# Patient Record
Sex: Male | Born: 1960 | Race: White | Hispanic: No | Marital: Married | Smoking: Never smoker
Health system: Southern US, Community
[De-identification: ages and names within clinical notes are randomized; demographics above are authoritative.]

## PROBLEM LIST (undated history)

## (undated) DIAGNOSIS — I10 Essential (primary) hypertension: Secondary | ICD-10-CM

## (undated) DIAGNOSIS — G4730 Sleep apnea, unspecified: Secondary | ICD-10-CM

## (undated) DIAGNOSIS — Z8619 Personal history of other infectious and parasitic diseases: Secondary | ICD-10-CM

## (undated) DIAGNOSIS — E7800 Pure hypercholesterolemia, unspecified: Secondary | ICD-10-CM

## (undated) HISTORY — DX: Sleep apnea, unspecified: G47.30

## (undated) HISTORY — DX: Essential (primary) hypertension: I10

## (undated) HISTORY — PX: INNER EAR SURGERY: SHX679

## (undated) HISTORY — DX: Pure hypercholesterolemia, unspecified: E78.00

## (undated) HISTORY — PX: TONSILLECTOMY: SUR1361

## (undated) HISTORY — DX: Personal history of other infectious and parasitic diseases: Z86.19

---

## 1980-06-23 HISTORY — PX: KNEE SURGERY: SHX244

## 2010-08-30 ENCOUNTER — Ambulatory Visit: Payer: Self-pay | Admitting: Gastroenterology

## 2010-12-12 ENCOUNTER — Encounter: Payer: Self-pay | Admitting: Orthopedic Surgery

## 2010-12-22 ENCOUNTER — Encounter: Payer: Self-pay | Admitting: Orthopedic Surgery

## 2012-03-23 ENCOUNTER — Ambulatory Visit (INDEPENDENT_AMBULATORY_CARE_PROVIDER_SITE_OTHER): Payer: Managed Care, Other (non HMO) | Admitting: Internal Medicine

## 2012-03-23 ENCOUNTER — Encounter: Payer: Self-pay | Admitting: Internal Medicine

## 2012-03-23 VITALS — BP 138/72 | HR 84 | Temp 98.5°F | Ht 73.0 in | Wt 224.0 lb

## 2012-03-23 DIAGNOSIS — R0602 Shortness of breath: Secondary | ICD-10-CM

## 2012-03-23 DIAGNOSIS — I10 Essential (primary) hypertension: Secondary | ICD-10-CM | POA: Insufficient documentation

## 2012-03-23 DIAGNOSIS — R079 Chest pain, unspecified: Secondary | ICD-10-CM

## 2012-03-23 LAB — CBC WITH DIFFERENTIAL/PLATELET
Basophils Relative: 0.3 % (ref 0.0–3.0)
Eosinophils Relative: 0.7 % (ref 0.0–5.0)
HCT: 43.8 % (ref 39.0–52.0)
Hemoglobin: 14.7 g/dL (ref 13.0–17.0)
Lymphs Abs: 1.4 10*3/uL (ref 0.7–4.0)
MCV: 92.7 fl (ref 78.0–100.0)
Monocytes Absolute: 0.5 10*3/uL (ref 0.1–1.0)
Monocytes Relative: 9.2 % (ref 3.0–12.0)
RBC: 4.72 Mil/uL (ref 4.22–5.81)
WBC: 5.2 10*3/uL (ref 4.5–10.5)

## 2012-03-23 LAB — HEPATIC FUNCTION PANEL
ALT: 40 U/L (ref 0–53)
Albumin: 4.1 g/dL (ref 3.5–5.2)
Alkaline Phosphatase: 44 U/L (ref 39–117)
Bilirubin, Direct: 0.1 mg/dL (ref 0.0–0.3)
Total Protein: 6.9 g/dL (ref 6.0–8.3)

## 2012-03-23 NOTE — Patient Instructions (Addendum)
It was good seeing you today.  I am sorry that you have not been feeling well.  I am going to do some blood tests and an ultrasound of your heart - to evaluated the pain.  We will contact you regarding these results as soon as they are available.  Call, return or be evaluated if symptoms worsen or do not resolve.

## 2012-03-23 NOTE — Assessment & Plan Note (Addendum)
Hypertension.  Blood pressure controlled.  Same meds.  Check met b.

## 2012-03-23 NOTE — Progress Notes (Signed)
  Subjective:    Patient ID: Rodney Benjamin, male    DOB: July 30, 1960, 51 y.o.   MRN: [id number]  HPI 51 year old male with past history of hypertension who comes in today with concerns regarding left chest and side pain/tightness.  Symptoms started approximately 3-4 weeks ago.  Intermittent.  Not brought on by increased activity or exertion.  He reports previously noticing some tightness with taking a deep breath.  Some sob, which has improved some.  No known injury or trauma.  Movement does not aggravate, but he does report it bothers him more when he is sitting.  Better with standing and lying flat.  No rash.  Took Ibuprofen last week and did notice some improvement.  Symptoms have not completely resolved.    Past Medical History  Diagnosis Date  . Chicken pox   . Hypertension     Review of Systems Patient denies any headache, lightheadedness or dizziness. Chest tightness as outlined.  No palpitations.  No cough or congestion. Some minimal sob - noticed more previously and with taking a deep breath. No pain with deep breathing.   No nausea or vomiting. No acid reflux.   No abdominal pain or cramping.  No bowel change, such as diarrhea, constipation, BRBPR or melana.  No urine change. No back pain.     Objective:   Physical Exam 51 year old male in no acute distress.   HEENT:  Nares - clear.  OP- without lesions or erythema.  NECK:  Supple, nontender.  No audible carotid bruit.   HEART:  Appears to be regular.  No murmur audible.   LUNGS:  Without crackles or wheezing audible.  Respirations even and unlabored. Good breath sounds bilaterally.   CHEST:  No pain to palpation.  (no reproducible pain).    RADIAL PULSE:  Equal bilaterally.  ABDOMEN:  Soft, nontender.  No audible abdominal bruit. BACK:  Nontender.    EXTREMITIES:  No increased edema to be present.   SKIN:  No rash.                      Assessment & Plan:  1.  Chest tightness/pain.  EKG today revealed SR with no acute  ischemic change noted.  Will hold on stress test -  given symptoms do not appear to worsen with increased activity or exertion.  Will check ECHO (given the tightness and sob) - to confirm no structural abnormality.  Hold on CXR - lungs clear and no cough or congestion.  Minimize Ibuprofen.  Use Tylenol ES as instructed.  Follow.  If any worsening change in symptoms or problems, he is to be evaluated immediately.  Will check cbc and liver panel.

## 2012-03-24 ENCOUNTER — Encounter: Payer: Self-pay | Admitting: Internal Medicine

## 2012-03-24 ENCOUNTER — Other Ambulatory Visit (HOSPITAL_COMMUNITY): Payer: Self-pay | Admitting: Internal Medicine

## 2012-03-24 ENCOUNTER — Ambulatory Visit (HOSPITAL_COMMUNITY): Payer: Managed Care, Other (non HMO) | Attending: Cardiovascular Disease | Admitting: Radiology

## 2012-03-24 DIAGNOSIS — R079 Chest pain, unspecified: Secondary | ICD-10-CM

## 2012-03-24 DIAGNOSIS — R072 Precordial pain: Secondary | ICD-10-CM

## 2012-03-24 LAB — BASIC METABOLIC PANEL WITH GFR
BUN: 20 mg/dL (ref 6–23)
CO2: 27 mEq/L (ref 19–32)
Calcium: 9.1 mg/dL (ref 8.4–10.5)
GFR, Est African American: 75 mL/min
Glucose, Bld: 94 mg/dL (ref 70–99)
Sodium: 138 mEq/L (ref 135–145)

## 2012-03-24 NOTE — Progress Notes (Signed)
Echocardiogram performed.  

## 2012-04-26 ENCOUNTER — Ambulatory Visit
Admission: RE | Admit: 2012-04-26 | Discharge: 2012-04-26 | Disposition: A | Discharge status: Home / Self Care | Payer: Managed Care, Other (non HMO) | Source: Ambulatory Visit | Attending: Internal Medicine | Admitting: Internal Medicine

## 2012-04-26 ENCOUNTER — Ambulatory Visit (INDEPENDENT_AMBULATORY_CARE_PROVIDER_SITE_OTHER): Payer: Managed Care, Other (non HMO) | Admitting: Internal Medicine

## 2012-04-26 ENCOUNTER — Ambulatory Visit (INDEPENDENT_AMBULATORY_CARE_PROVIDER_SITE_OTHER)
Admission: RE | Admit: 2012-04-26 | Discharge: 2012-04-26 | Disposition: A | Discharge status: Home / Self Care | Payer: Managed Care, Other (non HMO) | Source: Ambulatory Visit | Attending: Internal Medicine | Admitting: Internal Medicine

## 2012-04-26 ENCOUNTER — Encounter: Payer: Self-pay | Admitting: Internal Medicine

## 2012-04-26 VITALS — BP 122/80 | HR 72 | Temp 97.8°F | Ht 74.0 in | Wt 223.0 lb

## 2012-04-26 DIAGNOSIS — R079 Chest pain, unspecified: Secondary | ICD-10-CM

## 2012-04-26 DIAGNOSIS — R0781 Pleurodynia: Secondary | ICD-10-CM

## 2012-04-26 DIAGNOSIS — I10 Essential (primary) hypertension: Secondary | ICD-10-CM

## 2012-04-26 LAB — BASIC METABOLIC PANEL
BUN: 22 mg/dL (ref 6–23)
CO2: 28 mEq/L (ref 19–32)
Calcium: 8.9 mg/dL (ref 8.4–10.5)
Creatinine, Ser: 1.2 mg/dL (ref 0.4–1.5)

## 2012-04-26 MED ORDER — ETODOLAC 400 MG PO TABS: 400.0000 mg | ORAL_TABLET | Freq: Two times a day (BID) | ORAL | Status: DC | PRN

## 2012-04-26 NOTE — Assessment & Plan Note (Addendum)
Blood pressure under good control.  Check met b.  Stay hydrated.  Monitor blood pressures while on the antiinglammatory.

## 2012-04-26 NOTE — Patient Instructions (Addendum)
It was good seeing you again today.  I am going to go ahead and put you on Lodine (an antiinflammatory) - to take twice a day as needed.  Take with food.  Monitor your blood pressure.  Let me know if you have persistent pain.  We will notify you of your xray results once they are available.

## 2012-04-26 NOTE — Progress Notes (Signed)
  Subjective:    Patient ID: Rodney Benjamin, male    DOB: [birth year], 51 y.o.   MRN: [id number]  HPI 50 year old male with past history of hypertension who comes in today for a scheduled follow up.  He states he has been doing well.  Still with some left anterior chest pain.  Localized to a spot under the left breast/lower anterior rib. He also notices a spot left lateral rib.  No rash.  No pain with deep breathing.  No cough or congestion.  Pain has been persistent for weeks now.  He did put up some shingles over the weekend and felt this may have aggravated the area.  No tenderness with palpation.  Position changes can alleviate the pain somewhat.  Tylenol may help some.    Past Medical History  Diagnosis Date  . Chicken pox   . Hypertension     Review of Systems Patient denies any headache, lightheadedness or dizziness.  No sinus or allergy symptoms.  No other chest pain, tightness or palpitations.  No increased shortness of breath, cough or congestion.  No nausea or vomiting.  No abdominal pain or cramping.  No bowel change, such as diarrhea, constipation, BRBPR or melana.  No urine change.  Overall, otherwise he feels he is doing well.      Objective:   Physical Exam Filed Vitals:   04/26/12 0807  BP: 122/80  Pulse: 72  Temp: 97.8 F (54.58 C)   51 year old male in no acute distress.   HEENT:  Nares - clear.  OP- without lesions or erythema.  NECK:  Supple, nontender.  No audible bruit.   HEART:  Appears to be regular. LUNGS:  Without crackles or wheezing audible.  Respirations even and unlabored.  No pain with deep inspiration.   CHEST WALL:  No pain to palpation.  No rash.  RADIAL PULSE:  Equal bilaterally.  ABDOMEN:  Soft, nontender.  No audible abdominal bruit.   EXTREMITIES:  No increased edema to be present.                     Assessment & Plan:  CHEST PAIN.  Recent ECHO revealed normal LV function with no regional wall motion abnormality.  Pain persistent.  Appears  to be somewhat more c/w msk origin.  Will treat with Lodine 400mg  bid prn. Instructed to take with food and instructed to monitor blood pressure while on the medication.  Will check CXR and rib xray.  Call with update in the next two weeks.    HEALTH MAINTENANCE.  Will schedule a physical next visit.  Due in 12/13.  Discuss further health maintenance issues with him at that time.

## 2012-05-05 ENCOUNTER — Encounter: Payer: Self-pay | Admitting: *Deleted

## 2012-05-24 ENCOUNTER — Telehealth: Payer: Self-pay | Admitting: Internal Medicine

## 2012-05-24 NOTE — Telephone Encounter (Signed)
I don't mind doing labs ahead of time, but with insurance changes - they may not pay.  Does he still want to schedule.  If so, I can order.  Thanks.

## 2012-05-24 NOTE — Telephone Encounter (Signed)
Pt wanted to see if he could be seen sooner than 3/7 for his cpx  He would like to do this before end of year

## 2012-05-24 NOTE — Telephone Encounter (Signed)
Pt made appointment for cpx 08/27/12 and wanted to get labs prior to this appointment is that ok

## 2012-05-25 NOTE — Telephone Encounter (Signed)
Left message for pt to call office. Please advise him of his appointment 12/12 @ 9

## 2012-05-25 NOTE — Telephone Encounter (Signed)
See if he can come in 06/03/12 at 9:00 thanks

## 2012-05-26 NOTE — Telephone Encounter (Signed)
Sent my chart note letting pt know when appointment is

## 2012-06-03 ENCOUNTER — Encounter: Payer: Self-pay | Admitting: Internal Medicine

## 2012-06-03 ENCOUNTER — Ambulatory Visit (INDEPENDENT_AMBULATORY_CARE_PROVIDER_SITE_OTHER): Payer: Managed Care, Other (non HMO) | Admitting: Internal Medicine

## 2012-06-03 VITALS — BP 130/88 | HR 78 | Temp 98.4°F | Ht 74.0 in | Wt 223.5 lb

## 2012-06-03 DIAGNOSIS — Z23 Encounter for immunization: Secondary | ICD-10-CM

## 2012-06-03 DIAGNOSIS — Z125 Encounter for screening for malignant neoplasm of prostate: Secondary | ICD-10-CM

## 2012-06-03 DIAGNOSIS — E7800 Pure hypercholesterolemia, unspecified: Secondary | ICD-10-CM

## 2012-06-03 DIAGNOSIS — I10 Essential (primary) hypertension: Secondary | ICD-10-CM

## 2012-06-03 LAB — LIPID PANEL
Cholesterol: 181 mg/dL (ref 0–200)
LDL Cholesterol: 116 mg/dL — ABNORMAL HIGH (ref 0–99)

## 2012-06-03 LAB — BASIC METABOLIC PANEL
BUN: 17 mg/dL (ref 6–23)
Chloride: 99 mEq/L (ref 96–112)
GFR: 65.68 mL/min (ref >60.00)
Potassium: 4.1 mEq/L (ref 3.5–5.1)
Sodium: 134 mEq/L — ABNORMAL LOW (ref 135–145)

## 2012-06-03 NOTE — Patient Instructions (Addendum)
It was nice seeing you today.  Let me know if you need anything.   

## 2012-06-04 ENCOUNTER — Encounter: Payer: Self-pay | Admitting: Internal Medicine

## 2012-06-04 ENCOUNTER — Telehealth: Payer: Self-pay | Admitting: Internal Medicine

## 2012-06-04 DIAGNOSIS — E871 Hypo-osmolality and hyponatremia: Secondary | ICD-10-CM

## 2012-06-04 NOTE — Telephone Encounter (Signed)
Pt was notified of lab results via my chart.  He needs a follow up sodium check within the next two weeks.  Will you please call and schedule him for a lab appt time.  This is not a fasting lab.  I will place the order for the lab.  Thanks.

## 2012-06-05 ENCOUNTER — Telehealth: Payer: Self-pay | Admitting: Internal Medicine

## 2012-06-05 MED ORDER — LISINOPRIL 10 MG PO TABS: 10.0000 mg | ORAL_TABLET | Freq: Every day | ORAL | Status: DC

## 2012-06-05 NOTE — Telephone Encounter (Signed)
rx sent to [facility] for lisinopril 10mg  #90 with 3 refills

## 2012-06-06 ENCOUNTER — Encounter: Payer: Self-pay | Admitting: Internal Medicine

## 2012-06-06 DIAGNOSIS — E7800 Pure hypercholesterolemia, unspecified: Secondary | ICD-10-CM | POA: Insufficient documentation

## 2012-06-06 NOTE — Assessment & Plan Note (Signed)
Low cholesterol diet and exercise.  Check lipid panel.   

## 2012-06-06 NOTE — Assessment & Plan Note (Signed)
Blood pressure a little elevated today.  Have him spot check his pressure and send in.  Same medication.  Check metabolic panel.

## 2012-06-06 NOTE — Progress Notes (Signed)
  Subjective:    Patient ID: Rodney Benjamin, male    DOB: 1960-11-27, 51 y.o.   MRN: [id number]  HPI 51 year old male with past history of hypertension and hypercholesterolemia who comes in today to follow up on these issues as well as for a complete physical exam.  He states he is doing well.  Still having the lower ant chest/rib pain.  Has improved.  No chest pain or tightness with increased activity or exertion.  No sob.  Eating and drinking well.  No nausea or vomiting.  No bowel change.   Past Medical History  Diagnosis Date  . Hypertension   . Hypercholesterolemia   . History of chicken pox     Review of Systems Patient denies any headache, lightheadedness or dizziness.  No significant sinus or allergy symptoms.  No chest pain, tightness or palpitations.  No increased shortness of breath, cough or congestion.  No nausea or vomiting.  No abdominal pain or cramping.  No bowel change, such as diarrhea, constipation, BRBPR or melana.  No urine change.        Objective:   Physical Exam Filed Vitals:   06/03/12 0901  BP: 130/88  Pulse: 78  Temp: 98.4 F (79.53 C)   51 year old male in no acute distress.  HEENT:  Nares - clear.  Oropharynx - without lesions. NECK:  Supple.  Nontender.  No audible carotid bruit.  HEART:  Appears to be regular.   LUNGS:  No crackles or wheezing audible.  Respirations even and unlabored.  Good breath sounds bilaterally.   CHEST WALL.  No reproducible pain to palpation.  RADIAL PULSE:  Equal bilaterally.  ABDOMEN:  Soft.  Nontender.  Bowel sounds present and normal.  No audible abdominal bruit.  GU:  Normal descended testicles.  No palpable testicular nodules.   RECTAL:  Could not appreciate any palpable prostate nodules.  Heme negative.   EXTREMITIES:  No increased edema present.  DP pulses palpable and equal bilaterally.          Assessment & Plan:  CHEST/LOWER ANTERIOR RIB PAIN.  Better.  Not reproducible on exam.  Previous cxr negative. Did  not appear to change with Lodine.  Will follow.  If persistent pain, obtain CT.   ROSACEA.  Followed by dermatology in [place].    HEALTH MAINTENANCE.  Physical today.  Check PSA today. Colonoscopy 08/30/10 recommended follow up colonoscopy 08/29/20.

## 2012-06-15 ENCOUNTER — Telehealth: Payer: Self-pay | Admitting: Internal Medicine

## 2012-06-15 NOTE — Telephone Encounter (Signed)
I don't know if you already have a message on this pt.  I had notified him of his labs on 06/05/12 (via My Chart).  He needed a follow up sodium check.  I placed the order for lab, but he needs an appt time to come in.  Will you please schedule him an appt for lab only (not fasting) - within the next week.  Thanks.

## 2012-06-17 ENCOUNTER — Encounter: Payer: Self-pay | Admitting: Internal Medicine

## 2012-06-18 NOTE — Telephone Encounter (Signed)
scheduled

## 2012-06-21 ENCOUNTER — Other Ambulatory Visit (INDEPENDENT_AMBULATORY_CARE_PROVIDER_SITE_OTHER): Payer: Managed Care, Other (non HMO)

## 2012-06-21 DIAGNOSIS — E871 Hypo-osmolality and hyponatremia: Secondary | ICD-10-CM

## 2012-06-22 ENCOUNTER — Encounter: Payer: Self-pay | Admitting: Internal Medicine

## 2012-06-22 DIAGNOSIS — R079 Chest pain, unspecified: Secondary | ICD-10-CM

## 2012-06-23 NOTE — Telephone Encounter (Signed)
Ct chest ordered for persistent chest pain.

## 2012-06-25 ENCOUNTER — Ambulatory Visit: Payer: Self-pay | Admitting: Internal Medicine

## 2012-07-05 ENCOUNTER — Encounter: Payer: Self-pay | Admitting: Internal Medicine

## 2012-08-27 ENCOUNTER — Encounter: Payer: Managed Care, Other (non HMO) | Admitting: Internal Medicine

## 2012-10-07 ENCOUNTER — Encounter: Payer: Self-pay | Admitting: Internal Medicine

## 2012-10-07 ENCOUNTER — Ambulatory Visit (INDEPENDENT_AMBULATORY_CARE_PROVIDER_SITE_OTHER): Payer: Managed Care, Other (non HMO) | Admitting: Internal Medicine

## 2012-10-07 VITALS — BP 120/70 | HR 80 | Temp 98.3°F | Ht 74.0 in | Wt 223.8 lb

## 2012-10-07 DIAGNOSIS — I10 Essential (primary) hypertension: Secondary | ICD-10-CM

## 2012-10-07 DIAGNOSIS — E7800 Pure hypercholesterolemia, unspecified: Secondary | ICD-10-CM

## 2012-10-07 LAB — BASIC METABOLIC PANEL
BUN: 17 mg/dL (ref 6–23)
Calcium: 9.1 mg/dL (ref 8.4–10.5)
Creatinine, Ser: 1.2 mg/dL (ref 0.4–1.5)
GFR: 66.21 mL/min (ref >60.00)
Glucose, Bld: 115 mg/dL — ABNORMAL HIGH (ref 70–99)

## 2012-10-08 ENCOUNTER — Encounter: Payer: Self-pay | Admitting: Internal Medicine

## 2012-10-09 ENCOUNTER — Encounter: Payer: Self-pay | Admitting: Internal Medicine

## 2012-10-09 NOTE — Progress Notes (Signed)
  Subjective:    Patient ID: Rodney Benjamin, male    DOB: 1961/03/25, 52 y.o.   MRN: [id number]  HPI 52 year old male with past history of hypertension and hypercholesterolemia who comes in today for a scheduled follow up.  He states he is doing well.  Still having the lower ant chest/rib pain, but it is better.  Now only occurs with certain positions.  Riding in a car aggravates at times.  The more active he is - better.  No chest pain or tightness with increased activity or exertion.  No sob.  Eating and drinking well.  No nausea or vomiting.  No bowel change.  Went to [facility] three weeks ago.  States he was told he had pneumonia.  Received a steroid injection and abx.  Better now.  Had cxr.     Past Medical History  Diagnosis Date  . Hypertension   . Hypercholesterolemia   . History of chicken pox     Current Outpatient Prescriptions on File Prior to Visit  Medication Sig Dispense Refill  . doxycycline (DORYX) 100 MG EC tablet Take 100 mg by mouth daily.       Marland Kitchen lisinopril (PRINIVIL,ZESTRIL) 10 MG tablet Take 1 tablet (10 mg total) by mouth daily.  90 tablet  3   No current facility-administered medications on file prior to visit.    Review of Systems Patient denies any headache, lightheadedness or dizziness.  No significant sinus or allergy symptoms.  No chest pain, tightness or palpitations.  No increased shortness of breath, cough or congestion.  No nausea or vomiting.  No abdominal pain or cramping.  No bowel change, such as diarrhea, constipation, BRBPR or melana.  No urine change.  Rib/chest pain improved.  See above.      Objective:   Physical Exam  Filed Vitals:   10/07/12 0903  BP: 120/70  Pulse: 80  Temp: 98.3 F (36.8 C)   Blood pressure recheck:  128/78, pulse 38  52 year old male in no acute distress.  HEENT:  Nares - clear.  Oropharynx - without lesions. NECK:  Supple.  Nontender.  No audible carotid bruit.  HEART:  Appears to be regular.   LUNGS:  No  crackles or wheezing audible.  Respirations even and unlabored.  Good breath sounds bilaterally.   CHEST WALL.  No reproducible pain to palpation.  RADIAL PULSE:  Equal bilaterally.  ABDOMEN:  Soft.  Nontender.  Bowel sounds present and normal.  No audible abdominal bruit. EXTREMITIES:  No increased edema present.  DP pulses palpable and equal bilaterally.          Assessment & Plan:  CHEST/LOWER ANTERIOR RIB PAIN.  Better.  Not reproducible on exam.  Previous cxr negative.  More positional now when it does occur.  CT chest revealed an indeterminate 1.83mm nodule within the right middle lobe along the periphery, an ill defined increased density in the base of the lingula, no further focal or acute abnormalities.  Specifically, the region of interest within the chest wall is unremarkable.  Dr Thelma Barge reviewed.  Felt no further w/up warranted at this time.  Recommended a follow up chest CT in one year from date of previous.   ROSACEA.  Followed by dermatology in [place].    HEALTH MAINTENANCE.  Physical 06/03/12.  PSA 06/03/12 .61. Colonoscopy 08/30/10 recommended follow up colonoscopy 08/29/20.

## 2012-10-09 NOTE — Assessment & Plan Note (Signed)
Low cholesterol diet and exercise.  Follow lipid panel.   

## 2012-10-09 NOTE — Assessment & Plan Note (Signed)
Blood pressure doing well.  Same medication.  Check metabolic panel.

## 2012-10-12 ENCOUNTER — Telehealth: Payer: Self-pay

## 2012-10-12 NOTE — Telephone Encounter (Signed)
Left message for patient to call the office back to hear about his results... He just needs to know that his recent lab results. Your sodium is normal. Your other labs are ok

## 2012-12-25 IMAGING — CR DG RIBS 2V*L*
4 series · 4 of 4 positions shown · non-contrast
Comparison: 04/26/2012

CLINICAL DATA: Chest pain, lower anterior rib pain

LEFT RIBS - 2 VIEW

[view not recorded (1 of 4)]
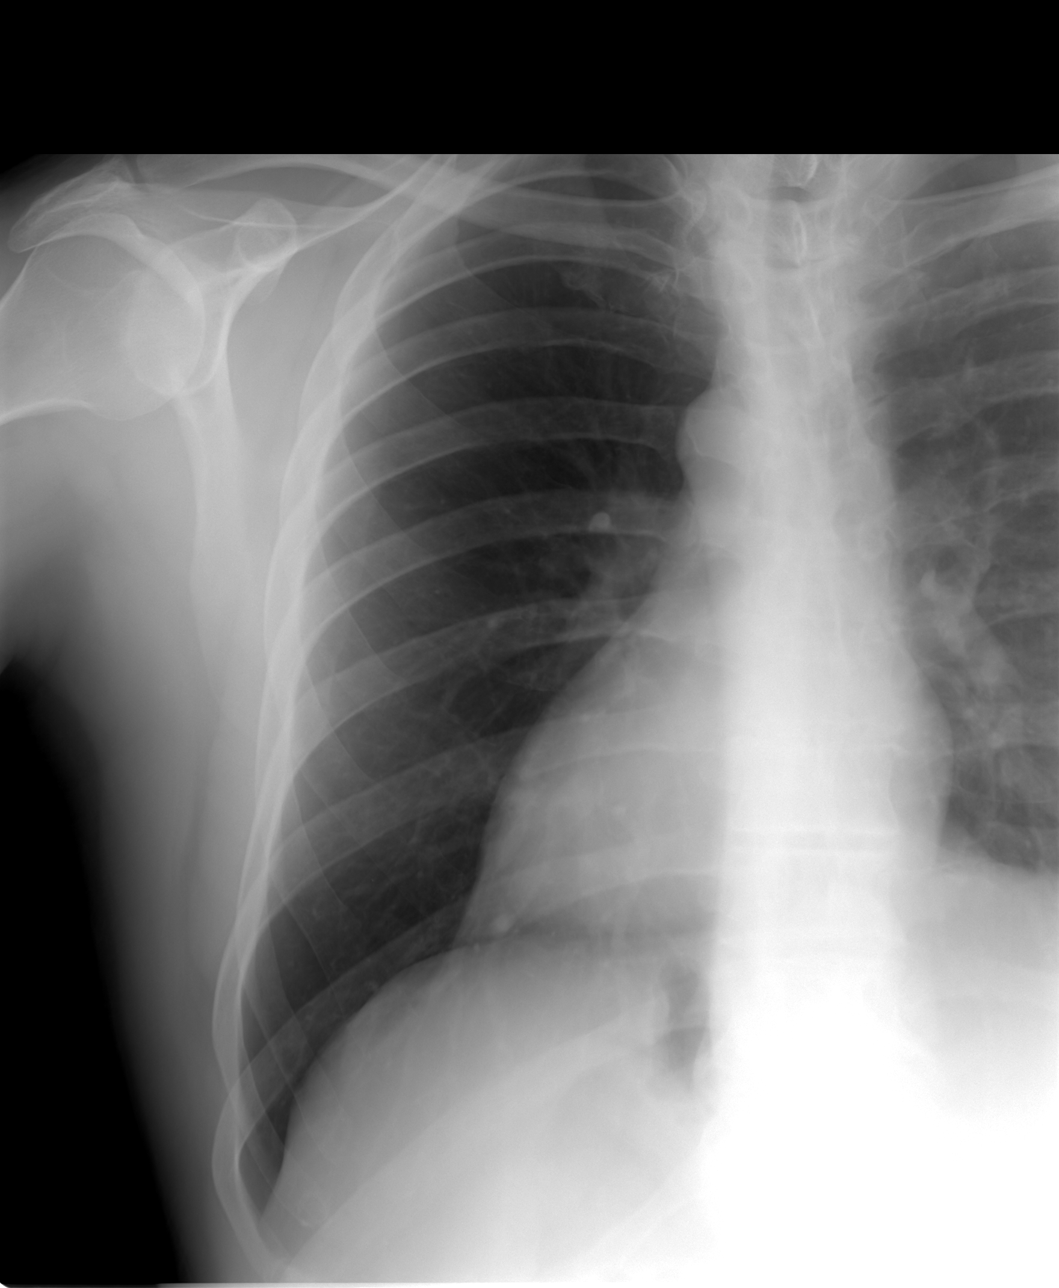

[view not recorded (2 of 4)]
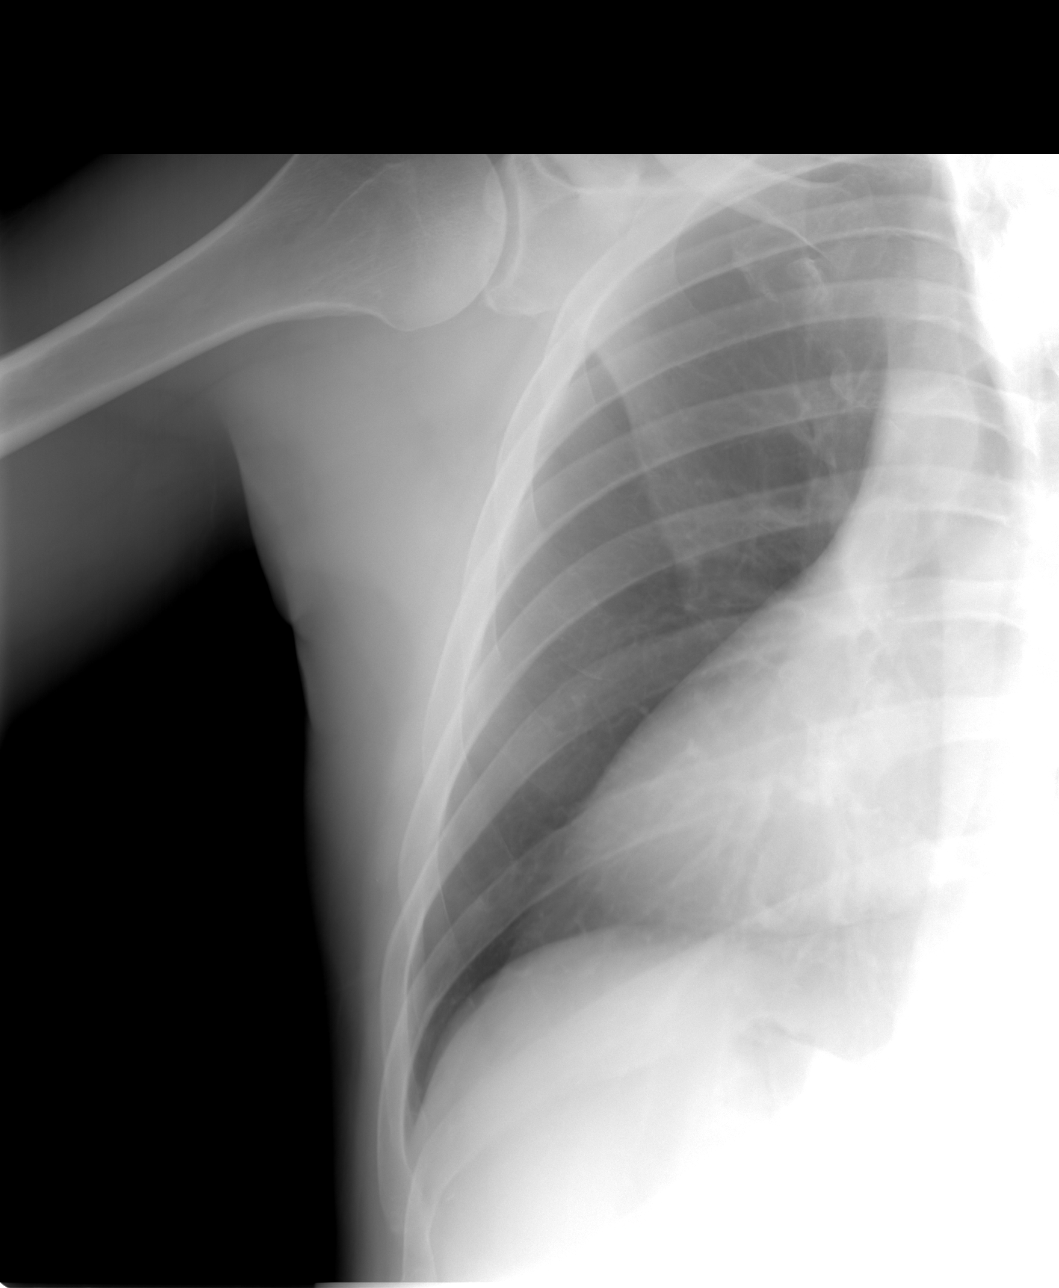

[view not recorded (3 of 4)]
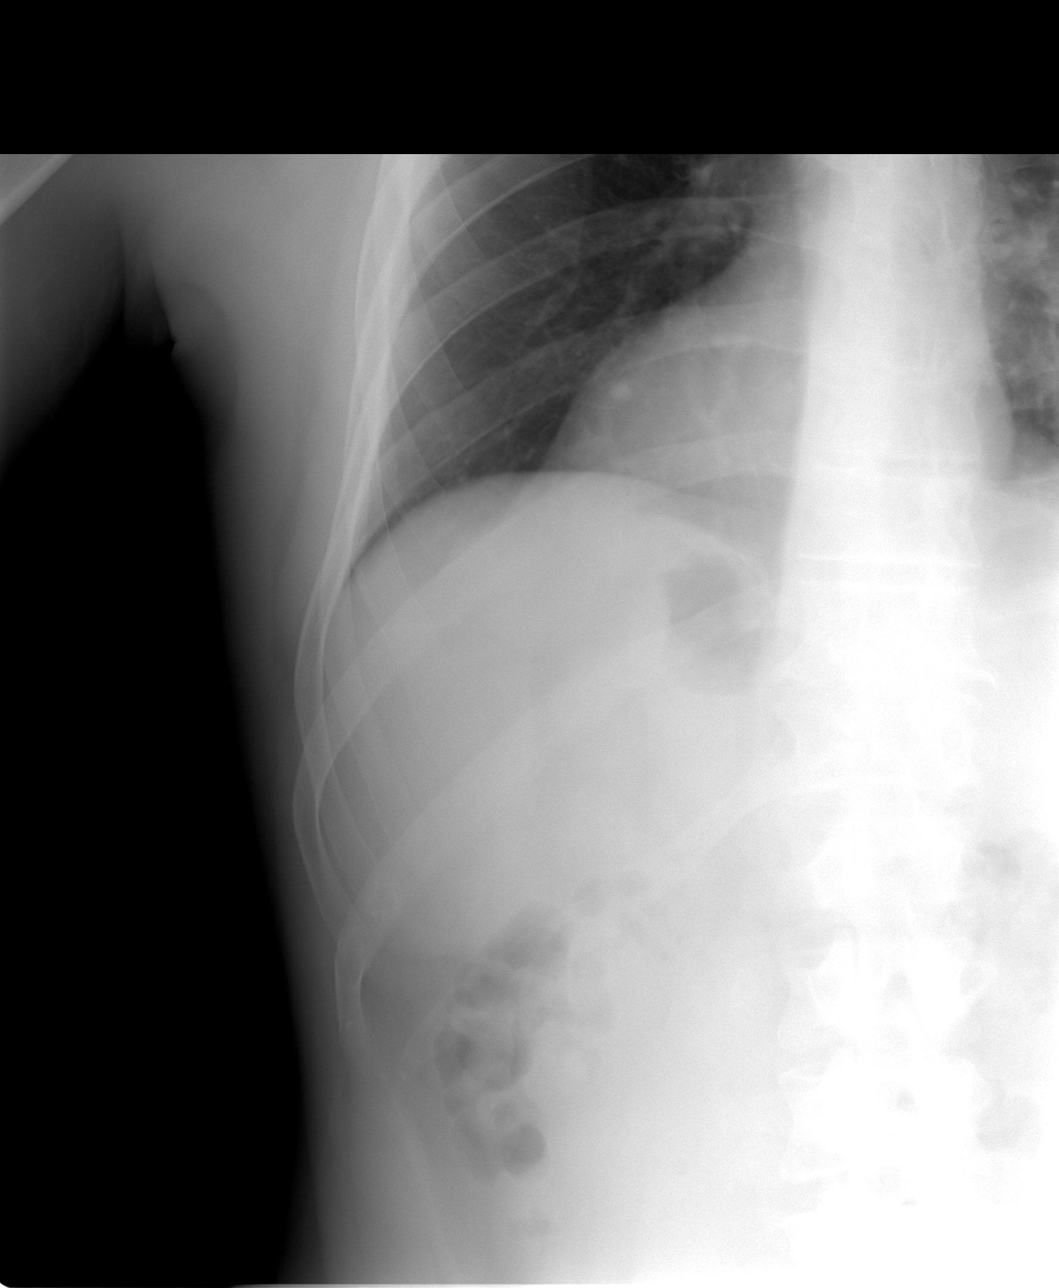

[view not recorded (4 of 4)]
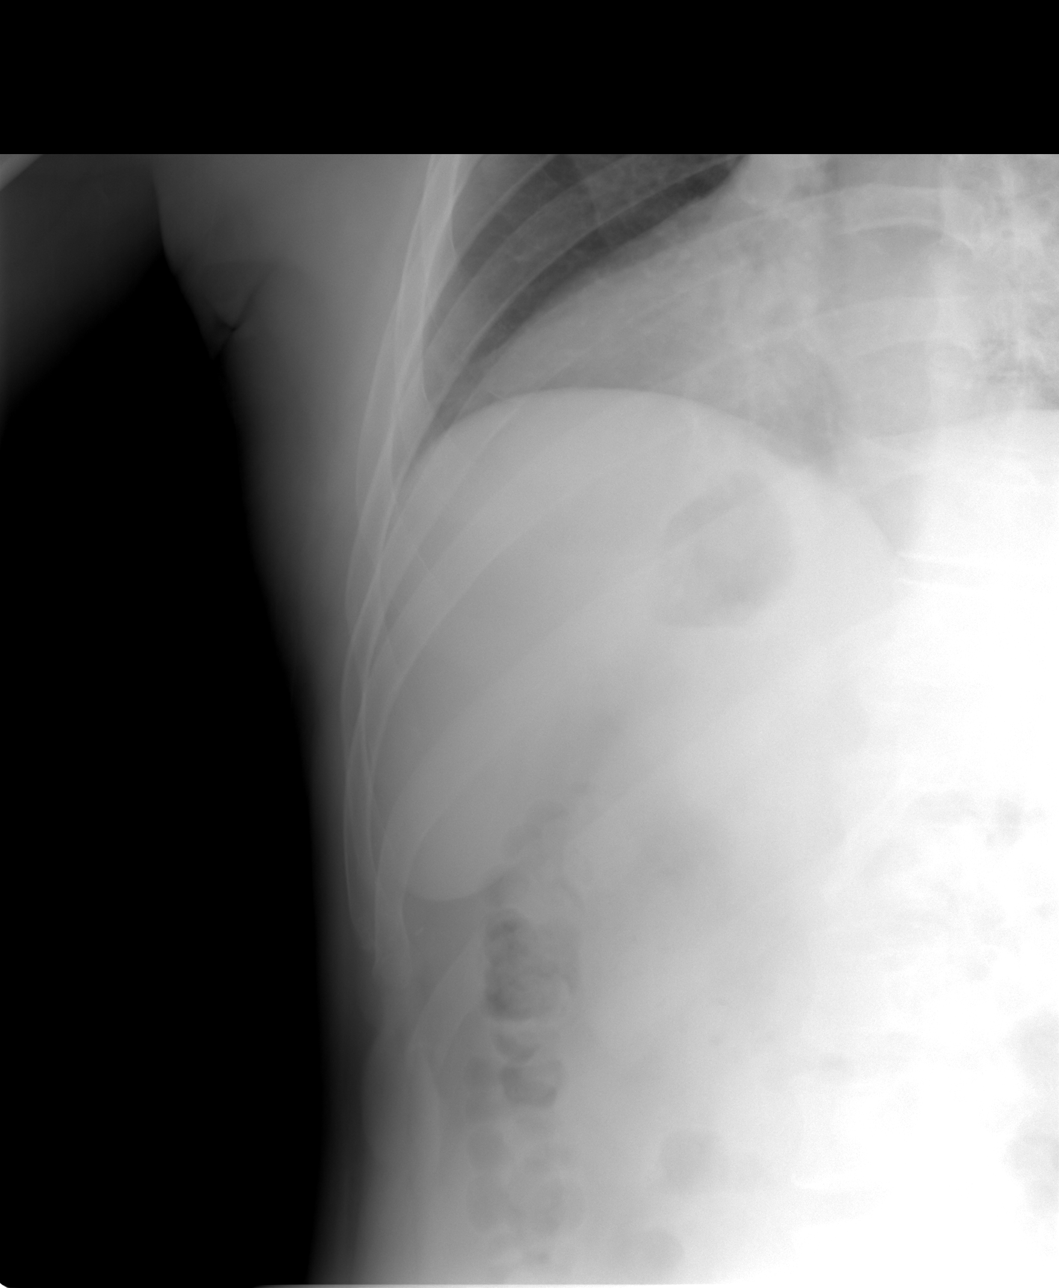

[4 of 4 positions shown; findings below may reference images not displayed]

FINDINGS: Four views left ribs submitted.  Cardiomediastinal
silhouette is unremarkable.  No left rib fracture is identified.
No diagnostic pneumothorax.
IMPRESSION: No left rib fracture is identified.  No diagnostic pneumothorax.

## 2013-02-08 ENCOUNTER — Encounter: Payer: Self-pay | Admitting: Internal Medicine

## 2013-02-08 ENCOUNTER — Telehealth: Payer: Self-pay | Admitting: *Deleted

## 2013-02-08 ENCOUNTER — Ambulatory Visit (INDEPENDENT_AMBULATORY_CARE_PROVIDER_SITE_OTHER): Payer: Managed Care, Other (non HMO) | Admitting: Internal Medicine

## 2013-02-08 VITALS — BP 120/80 | HR 72 | Temp 98.0°F | Ht 74.0 in | Wt 221.2 lb

## 2013-02-08 DIAGNOSIS — I10 Essential (primary) hypertension: Secondary | ICD-10-CM

## 2013-02-08 DIAGNOSIS — E7800 Pure hypercholesterolemia, unspecified: Secondary | ICD-10-CM

## 2013-02-08 DIAGNOSIS — R1012 Left upper quadrant pain: Secondary | ICD-10-CM

## 2013-02-08 DIAGNOSIS — R109 Unspecified abdominal pain: Secondary | ICD-10-CM

## 2013-02-08 NOTE — Assessment & Plan Note (Signed)
Low cholesterol diet and exercise.  Follow lipid panel.   

## 2013-02-08 NOTE — Assessment & Plan Note (Signed)
Blood pressure doing well.  Same medication.  Follow metabolic panel.   

## 2013-02-08 NOTE — Progress Notes (Signed)
  Subjective:    Patient ID: Rodney Benjamin, male    DOB: 09/21/1960, 52 y.o.   MRN: [id number]  HPI 52 year old male with past history of hypertension and hypercholesterolemia who comes in today for a scheduled follow up.  He states he is doing well.  Still having the lower ant chest/rib and upper LUQ pain. Not as bad as initial presentation.  Better, but persistent.   The more active he is - better.  Did report some increased fullness in this area with eating (recently).  No other abdominal pain or cramping.  No chest pain or tightness with increased activity or exertion.  No sob.  Eating and drinking well.  No nausea or vomiting.  No bowel change.  Blood pressure doing well.       Past Medical History  Diagnosis Date  . Hypertension   . Hypercholesterolemia   . History of chicken pox     Current Outpatient Prescriptions on File Prior to Visit  Medication Sig Dispense Refill  . doxycycline (DORYX) 100 MG EC tablet Take 100 mg by mouth daily.       Marland Kitchen lisinopril (PRINIVIL,ZESTRIL) 10 MG tablet Take 1 tablet (10 mg total) by mouth daily.  90 tablet  3   No current facility-administered medications on file prior to visit.    Review of Systems Patient denies any headache, lightheadedness or dizziness.  No significant sinus or allergy symptoms.  No chest pain, tightness or palpitations.  No increased shortness of breath, cough or congestion.  No nausea or vomiting.  Does still report the lower anterior rib/chest and LUQ pain.  See above.  No bowel change, such as diarrhea, constipation, BRBPR or melana.  No urine change.     Objective:   Physical Exam  Filed Vitals:   02/08/13 0950  BP: 120/80  Pulse: 72  Temp: 98 F (36.7 C)   Blood pressure recheck:  122/78, pulse 21  52 year old male in no acute distress.  HEENT:  Nares - clear.  Oropharynx - without lesions. NECK:  Supple.  Nontender.  No audible carotid bruit.  HEART:  Appears to be regular.   LUNGS:  No crackles or  wheezing audible.  Respirations even and unlabored.  Good breath sounds bilaterally.   CHEST WALL.  No reproducible pain to palpation.  RADIAL PULSE:  Equal bilaterally.  ABDOMEN:  Soft.  Nontender.  Bowel sounds present and normal.  No audible abdominal bruit. EXTREMITIES:  No increased edema present.  DP pulses palpable and equal bilaterally.          Assessment & Plan:  CHEST/LOWER ANTERIOR RIB AND LUQ PAIN.  Better.  Not reproducible on exam.  Previous cxr negative.  CT chest revealed an indeterminate 1.16mm nodule within the right middle lobe along the periphery, an ill defined increased density in the base of the lingula, no further focal or acute abnormalities.  Specifically, the region of interest within the chest wall is unremarkable.  Dr Thelma Barge reviewed.  Felt no further w/up warranted at this time.  Recommended a follow up chest CT in one year from date of previous.  Noticed the increased fullness in this area after eating.  Will check abdominal ultrasound.  Check liver panel.   ROSACEA.  Followed by dermatology in [place].    HEALTH MAINTENANCE.  Physical 06/03/12.  PSA 06/03/12 .61. Colonoscopy 08/30/10 recommended follow up colonoscopy 08/29/20.

## 2013-02-08 NOTE — Telephone Encounter (Signed)
Pt is coming in for labs tomorrow what labs and dx?  

## 2013-02-09 ENCOUNTER — Other Ambulatory Visit (INDEPENDENT_AMBULATORY_CARE_PROVIDER_SITE_OTHER): Payer: Managed Care, Other (non HMO)

## 2013-02-09 ENCOUNTER — Ambulatory Visit: Payer: Self-pay | Admitting: Internal Medicine

## 2013-02-09 DIAGNOSIS — R109 Unspecified abdominal pain: Secondary | ICD-10-CM

## 2013-02-09 DIAGNOSIS — I10 Essential (primary) hypertension: Secondary | ICD-10-CM

## 2013-02-09 DIAGNOSIS — E7800 Pure hypercholesterolemia, unspecified: Secondary | ICD-10-CM

## 2013-02-09 LAB — BASIC METABOLIC PANEL
BUN: 18 mg/dL (ref 6–23)
Calcium: 9.1 mg/dL (ref 8.4–10.5)
Creatinine, Ser: 1.1 mg/dL (ref 0.4–1.5)
GFR: 72.98 mL/min (ref >60.00)
Potassium: 4.1 mEq/L (ref 3.5–5.1)

## 2013-02-09 LAB — HEPATIC FUNCTION PANEL
AST: 19 U/L (ref 0–37)
Total Bilirubin: 1 mg/dL (ref 0.3–1.2)

## 2013-02-09 LAB — LIPID PANEL
Cholesterol: 158 mg/dL (ref 0–200)
HDL: 36.7 mg/dL — ABNORMAL LOW (ref >39.00)
LDL Cholesterol: 107 mg/dL — ABNORMAL HIGH (ref 0–99)
Triglycerides: 71 mg/dL (ref 0.0–149.0)
VLDL: 14.2 mg/dL (ref 0.0–40.0)

## 2013-02-09 NOTE — Telephone Encounter (Signed)
Order placed for labs.

## 2013-02-10 ENCOUNTER — Encounter: Payer: Self-pay | Admitting: Internal Medicine

## 2013-02-16 ENCOUNTER — Telehealth: Payer: Self-pay | Admitting: Internal Medicine

## 2013-02-16 DIAGNOSIS — N281 Cyst of kidney, acquired: Secondary | ICD-10-CM

## 2013-02-16 NOTE — Telephone Encounter (Signed)
Pt notified of ultrasound results and mildly enlarged spleen.  Also notified of cyst left kidney (question - complex).  Recommend appt with urology.  Order placed for referral.

## 2013-02-28 ENCOUNTER — Encounter: Payer: Self-pay | Admitting: Internal Medicine

## 2013-04-20 ENCOUNTER — Ambulatory Visit (INDEPENDENT_AMBULATORY_CARE_PROVIDER_SITE_OTHER): Payer: Managed Care, Other (non HMO) | Admitting: *Deleted

## 2013-04-20 DIAGNOSIS — Z23 Encounter for immunization: Secondary | ICD-10-CM

## 2013-06-09 ENCOUNTER — Ambulatory Visit (INDEPENDENT_AMBULATORY_CARE_PROVIDER_SITE_OTHER): Payer: Managed Care, Other (non HMO) | Admitting: Internal Medicine

## 2013-06-09 ENCOUNTER — Encounter: Payer: Self-pay | Admitting: Emergency Medicine

## 2013-06-09 ENCOUNTER — Encounter: Payer: Self-pay | Admitting: Internal Medicine

## 2013-06-09 VITALS — BP 142/80 | HR 85 | Temp 98.1°F | Ht 73.0 in | Wt 215.8 lb

## 2013-06-09 DIAGNOSIS — R079 Chest pain, unspecified: Secondary | ICD-10-CM

## 2013-06-09 DIAGNOSIS — E7800 Pure hypercholesterolemia, unspecified: Secondary | ICD-10-CM

## 2013-06-09 DIAGNOSIS — R0781 Pleurodynia: Secondary | ICD-10-CM

## 2013-06-09 DIAGNOSIS — I10 Essential (primary) hypertension: Secondary | ICD-10-CM

## 2013-06-09 DIAGNOSIS — N281 Cyst of kidney, acquired: Secondary | ICD-10-CM

## 2013-06-09 DIAGNOSIS — Q619 Cystic kidney disease, unspecified: Secondary | ICD-10-CM

## 2013-06-09 NOTE — Progress Notes (Signed)
Pre-visit discussion using our clinic review tool. No additional management support is needed unless otherwise documented below in the visit note.  

## 2013-06-09 NOTE — Progress Notes (Signed)
Subjective:    Patient ID: Rodney Benjamin, male    DOB: [birth year], 52 y.o.   MRN: [id number]  HPI 52 year old male with past history of hypertension and hypercholesterolemia who comes in today to follow up on these issues as well as for a complete physical exam.   He states he is doing relatively well.  Still having the lower ant chest/rib and upper LUQ pain. Not as bad as initial presentation.  Better, but persistent.   No other abdominal pain or cramping.  States he has noticed that when he is sitting - he feels pressure in the upper left abdomen/ribs.  If he sits upright - better.  No chest pain or tightness with increased activity or exertion.  No sob.  Eating and drinking well.  No nausea or vomiting.  No bowel change.  Blood pressure doing well.       Past Medical History  Diagnosis Date  . Hypertension   . Hypercholesterolemia   . History of chicken pox     Current Outpatient Prescriptions on File Prior to Visit  Medication Sig Dispense Refill  . doxycycline (DORYX) 100 MG EC tablet Take 100 mg by mouth daily.       Marland Kitchen lisinopril (PRINIVIL,ZESTRIL) 10 MG tablet Take 1 tablet (10 mg total) by mouth daily.  90 tablet  3   No current facility-administered medications on file prior to visit.    Review of Systems Patient denies any headache, lightheadedness or dizziness.  No significant sinus or allergy symptoms.  No chest pain, tightness or palpitations.  No increased shortness of breath, cough or congestion.  No nausea or vomiting.  Does still report the lower anterior rib/chest and LUQ pain.  See above.  No bowel change, such as diarrhea, constipation, BRBPR or melana.  No urine change.     Objective:   Physical Exam  Filed Vitals:   06/09/13 1328  BP: 142/80  Pulse: 85  Temp: 98.1 F (36.7 C)   Blood pressure recheck:  41/13  52 year old male in no acute distress.  HEENT:  Nares - clear.  Oropharynx - without lesions. NECK:  Supple.  Nontender.  No audible carotid  bruit.  HEART:  Appears to be regular.   LUNGS:  No crackles or wheezing audible.  Respirations even and unlabored.   RADIAL PULSE:  Equal bilaterally.  ABDOMEN:  Soft.  Nontender.  Bowel sounds present and normal.  No audible abdominal bruit.  GU:  Normal descended testicles.  No palpable testicular nodules.   RECTAL:  Could not appreciate any palpable prostate nodules.  Heme negative.   EXTREMITIES:  No increased edema present.  DP pulses palpable and equal bilaterally.         Assessment & Plan:  CHEST/LOWER ANTERIOR RIB AND LUQ PAIN.  Better.  Not reproducible on exam.  Previous cxr negative.  CT chest revealed an indeterminate 1.88mm nodule within the right middle lobe along the periphery, an ill defined increased density in the base of the lingula, no further focal or acute abnormalities.  Specifically, the region of interest within the chest wall is unremarkable.  Dr Thelma Barge reviewed.  Felt no further w/up warranted at this time.  Recommended a follow up chest CT in one year from date of previous.  Is due for f/u chest CT in 1/15.  Abdominal ultrasound that revealed a borderline enlarged spleen.  Unclear as to the exact etiology.  Worsened by certain position.  Sitting upright - better.  Will have Dr Gavin Potters evaluate.  Further scanning and w/up pending his review.    ROSACEA.  Followed by dermatology in [place].    HEALTH MAINTENANCE.  Physical today.  PSA 06/03/12 .61.  Check psa with next labs.  Colonoscopy 08/30/10 recommended follow up colonoscopy 08/29/20.    I spent 25 minutes with the patient and more than 50% of the time was spent in consultation regarding the above.

## 2013-06-10 ENCOUNTER — Other Ambulatory Visit (INDEPENDENT_AMBULATORY_CARE_PROVIDER_SITE_OTHER): Payer: Managed Care, Other (non HMO)

## 2013-06-10 ENCOUNTER — Telehealth: Payer: Self-pay | Admitting: *Deleted

## 2013-06-10 DIAGNOSIS — I10 Essential (primary) hypertension: Secondary | ICD-10-CM

## 2013-06-10 DIAGNOSIS — R5383 Other fatigue: Secondary | ICD-10-CM

## 2013-06-10 DIAGNOSIS — E7800 Pure hypercholesterolemia, unspecified: Secondary | ICD-10-CM

## 2013-06-10 DIAGNOSIS — Z125 Encounter for screening for malignant neoplasm of prostate: Secondary | ICD-10-CM

## 2013-06-10 DIAGNOSIS — R5381 Other malaise: Secondary | ICD-10-CM

## 2013-06-10 LAB — CBC WITH DIFFERENTIAL/PLATELET
Basophils Absolute: 0 10*3/uL (ref 0.0–0.1)
Eosinophils Relative: 0.7 % (ref 0.0–5.0)
HCT: 43.6 % (ref 39.0–52.0)
Lymphocytes Relative: 26.6 % (ref 12.0–46.0)
Lymphs Abs: 1.4 10*3/uL (ref 0.7–4.0)
MCHC: 33.8 g/dL (ref 30.0–36.0)
Monocytes Relative: 8.3 % (ref 3.0–12.0)
Neutrophils Relative %: 64.1 % (ref 43.0–77.0)
Platelets: 168 10*3/uL (ref 150.0–400.0)
RDW: 12.3 % (ref 11.5–14.6)
WBC: 5.4 10*3/uL (ref 4.5–10.5)

## 2013-06-10 LAB — COMPREHENSIVE METABOLIC PANEL
ALT: 25 U/L (ref 0–53)
Albumin: 4.3 g/dL (ref 3.5–5.2)
CO2: 29 mEq/L (ref 19–32)
GFR: 67.96 mL/min (ref >60.00)
Potassium: 4.4 mEq/L (ref 3.5–5.1)
Sodium: 139 mEq/L (ref 135–145)
Total Bilirubin: 0.9 mg/dL (ref 0.3–1.2)
Total Protein: 6.7 g/dL (ref 6.0–8.3)

## 2013-06-10 LAB — PSA: PSA: 0.49 ng/mL (ref 0.10–4.00)

## 2013-06-10 LAB — LIPID PANEL
HDL: 34.4 mg/dL — ABNORMAL LOW (ref >39.00)
LDL Cholesterol: 95 mg/dL (ref 0–99)
Total CHOL/HDL Ratio: 4
VLDL: 12.8 mg/dL (ref 0.0–40.0)

## 2013-06-10 LAB — TSH: TSH: 0.78 u[IU]/mL (ref 0.35–5.50)

## 2013-06-10 NOTE — Telephone Encounter (Signed)
Labs ordered.

## 2013-06-10 NOTE — Telephone Encounter (Signed)
What labs and dx?  

## 2013-06-11 ENCOUNTER — Encounter: Payer: Self-pay | Admitting: Internal Medicine

## 2013-06-12 ENCOUNTER — Encounter: Payer: Self-pay | Admitting: Internal Medicine

## 2013-06-12 DIAGNOSIS — N281 Cyst of kidney, acquired: Secondary | ICD-10-CM | POA: Insufficient documentation

## 2013-06-12 NOTE — Assessment & Plan Note (Signed)
Low cholesterol diet and exercise.  Follow lipid panel.   

## 2013-06-12 NOTE — Assessment & Plan Note (Signed)
Blood pressure doing well.  Same medication.  Follow metabolic panel.   

## 2013-06-12 NOTE — Assessment & Plan Note (Signed)
Evaluated by Dr Cope.  Recommend a f/u in 6 months.  Check metabolic panel.     

## 2013-06-13 ENCOUNTER — Other Ambulatory Visit: Payer: Self-pay | Admitting: Internal Medicine

## 2013-07-15 ENCOUNTER — Ambulatory Visit (INDEPENDENT_AMBULATORY_CARE_PROVIDER_SITE_OTHER): Payer: Managed Care, Other (non HMO) | Admitting: Internal Medicine

## 2013-07-15 ENCOUNTER — Encounter: Payer: Self-pay | Admitting: Internal Medicine

## 2013-07-15 VITALS — BP 120/80 | HR 70 | Temp 98.1°F | Ht 73.0 in | Wt 222.0 lb

## 2013-07-15 DIAGNOSIS — I10 Essential (primary) hypertension: Secondary | ICD-10-CM

## 2013-07-15 DIAGNOSIS — E7800 Pure hypercholesterolemia, unspecified: Secondary | ICD-10-CM

## 2013-07-15 DIAGNOSIS — N281 Cyst of kidney, acquired: Secondary | ICD-10-CM

## 2013-07-15 DIAGNOSIS — Q619 Cystic kidney disease, unspecified: Secondary | ICD-10-CM

## 2013-07-15 NOTE — Progress Notes (Signed)
Subjective:    Patient ID: Rodney Benjamin, male    DOB: Dec 01, 1960, 53 y.o.   MRN: [id number]  HPI 53 year old male with past history of hypertension and hypercholesterolemia who comes in today for a scheduled follow up.   He states he is doing relatively well.  Still having the lower ant chest/rib and upper LUQ pain. Not as bad as initial presentation.  Better, but persistent.   No other abdominal pain or cramping.  States he has noticed that when he is sitting - he feels pressure in the upper left abdomen/ribs.  If he sits upright - better.  No chest pain or tightness with increased activity or exertion.  No sob.  Eating and drinking well.  No nausea or vomiting.  No bowel change.  Blood pressure doing well.   Saw Dr Gavin Potters.  Recommended a trial of gabapentin.  Pt comfortable with this plan.  Does describe a burning and tingling sensation.  No sob.      Past Medical History  Diagnosis Date  . Hypertension   . Hypercholesterolemia   . History of chicken pox     Current Outpatient Prescriptions on File Prior to Visit  Medication Sig Dispense Refill  . doxycycline (DORYX) 100 MG EC tablet Take 100 mg by mouth daily.       Marland Kitchen lisinopril (PRINIVIL,ZESTRIL) 10 MG tablet TAKE ONE TABLET BY MOUTH EVERY DAY  90 tablet  1   No current facility-administered medications on file prior to visit.    Review of Systems Patient denies any headache, lightheadedness or dizziness.  No significant sinus or allergy symptoms.  No chest pain, tightness or palpitations.  No increased shortness of breath, cough or congestion.  No nausea or vomiting.  Does still report the lower anterior rib/chest and LUQ pain.  See above.  No bowel change, such as diarrhea, constipation, BRBPR or melana.  No urine change.     Objective:   Physical Exam  Filed Vitals:   07/15/13 1359  BP: 120/80  Pulse: 70  Temp: 98.1 F (41.51 C)   53 year old male in no acute distress.  HEENT:  Nares - clear.  Oropharynx - without  lesions. NECK:  Supple.  Nontender.  No audible carotid bruit.  HEART:  Appears to be regular.   LUNGS:  No crackles or wheezing audible.  Respirations even and unlabored.   RADIAL PULSE:  Equal bilaterally.  ABDOMEN:  Soft.  Nontender.  Bowel sounds present and normal.  No audible abdominal bruit.  No reproducible tenderness to palpation.    EXTREMITIES:  No increased edema present.  DP pulses palpable and equal bilaterally.         Assessment & Plan:  CHEST/LOWER ANTERIOR RIB AND LUQ PAIN.  Better.  Not reproducible on exam.  Previous cxr negative.  CT chest revealed an indeterminate 1.60mm nodule within the right middle lobe along the periphery, an ill defined increased density in the base of the lingula, no further focal or acute abnormalities.  Specifically, the region of interest within the chest wall is unremarkable.  Dr Thelma Barge reviewed.  Felt no further w/up warranted at this time.  Recommended a follow up chest CT in one year from date of previous.  Is due for f/u chest CT in 1/15.  Abdominal ultrasound that revealed a borderline enlarged spleen.  Unclear as to the exact etiology.  Worsened by certain position.  Sitting upright - better.  Dr Gavin Potters evaluated.  Recommended a trial of  gabapentin.      ROSACE[place].  [place]    HEALTH MAINTENANCE.  Physical 06/09/13.  PSA 06/10/13 .4.  Colonoscopy 08/30/10 recommended follow up colonoscopy 08/29/20.    I spent 25 minutes with the patient and more than 50% of the time was spent in consultation regarding the above.

## 2013-07-15 NOTE — Progress Notes (Signed)
Pre-visit discussion using our clinic review tool. No additional management support is needed unless otherwise documented below in the visit note.  

## 2013-07-15 NOTE — Assessment & Plan Note (Addendum)
Evaluated by Dr Achilles Dunkope.  Recommend a f/u in 6 months.  Check metabolic panel.

## 2013-07-17 ENCOUNTER — Encounter: Payer: Self-pay | Admitting: Internal Medicine

## 2013-07-17 NOTE — Assessment & Plan Note (Signed)
Low cholesterol diet and exercise.  Follow lipid panel.   

## 2013-07-17 NOTE — Assessment & Plan Note (Signed)
Blood pressure doing well.  Same medication.  Follow metabolic panel.   

## 2013-08-01 ENCOUNTER — Encounter: Payer: Self-pay | Admitting: Internal Medicine

## 2013-08-15 ENCOUNTER — Telehealth: Payer: Self-pay | Admitting: Internal Medicine

## 2013-08-15 NOTE — Telephone Encounter (Signed)
Pt came in today wanting to make an appointment with you.  Pt states his pain in his side is not any better.  i made his appointment for 08/25/13 @ 8:15 with you.  I offered him an appointment with Dr Darrick Huntsmanullo today 08/15/13 he stated he wanted to wait to see you.  He also wanted me to check to see if he could be see sooner by you than 3/5

## 2013-08-16 NOTE — Telephone Encounter (Signed)
Can hold for cancellation.  With me being out, I don't see an earlier appt at this time.  Thanks.

## 2013-08-18 ENCOUNTER — Encounter: Payer: Self-pay | Admitting: Internal Medicine

## 2013-08-19 NOTE — Telephone Encounter (Signed)
appointmetn 3/2 pt aware sent my chart message also

## 2013-08-22 ENCOUNTER — Ambulatory Visit (INDEPENDENT_AMBULATORY_CARE_PROVIDER_SITE_OTHER): Payer: Managed Care, Other (non HMO) | Admitting: Internal Medicine

## 2013-08-22 ENCOUNTER — Encounter: Payer: Self-pay | Admitting: Internal Medicine

## 2013-08-22 VITALS — BP 130/80 | HR 90 | Temp 97.9°F | Ht 73.0 in | Wt 215.5 lb

## 2013-08-22 DIAGNOSIS — R9389 Abnormal findings on diagnostic imaging of other specified body structures: Secondary | ICD-10-CM

## 2013-08-22 DIAGNOSIS — N281 Cyst of kidney, acquired: Secondary | ICD-10-CM

## 2013-08-22 DIAGNOSIS — R109 Unspecified abdominal pain: Secondary | ICD-10-CM

## 2013-08-22 DIAGNOSIS — I10 Essential (primary) hypertension: Secondary | ICD-10-CM

## 2013-08-22 DIAGNOSIS — Q619 Cystic kidney disease, unspecified: Secondary | ICD-10-CM

## 2013-08-22 NOTE — Progress Notes (Signed)
Pre-visit discussion using our clinic review tool. No additional management support is needed unless otherwise documented below in the visit note.  

## 2013-08-24 ENCOUNTER — Encounter: Payer: Self-pay | Admitting: Internal Medicine

## 2013-08-24 NOTE — Progress Notes (Signed)
Subjective:    Patient ID: Rodney Benjamin, male    DOB: 1960/10/09, 53 y.o.   MRN: [id number]  HPI 53 year old male with past history of hypertension and hypercholesterolemia who comes in today as a work in.  Still having the lower ant chest/rib and upper LUQ pain.  States recently has noticed a burning sensation from the epigastric region to the left lateral side.  States he has noticed that when he is sitting - he feels pressure in the upper left abdomen/ribs.  If he sits upright - better.  Described as some tightness and pressure.  Lying down relieves.  No chest pain or tightness with increased activity or exertion.  No sob.  Eating and drinking well.  No nausea or vomiting.  No bowel change.  Saw Dr Gavin Potters.  Recommended a trial of gabapentin.  Took for a while.  Did not help.      Past Medical History  Diagnosis Date  . Hypertension   . Hypercholesterolemia   . History of chicken pox     Current Outpatient Prescriptions on File Prior to Visit  Medication Sig Dispense Refill  . doxycycline (DORYX) 100 MG EC tablet Take 100 mg by mouth daily.       Marland Kitchen lisinopril (PRINIVIL,ZESTRIL) 10 MG tablet TAKE ONE TABLET BY MOUTH EVERY DAY  90 tablet  1   No current facility-administered medications on file prior to visit.    Review of Systems No chest pain, tightness or palpitations.  No increased shortness of breath, cough or congestion.  No nausea or vomiting.  Left lower anterior rib/chest and LUQ pain.  See above.  No bowel change, such as diarrhea, constipation, BRBPR or melana.  No urine change.     Objective:   Physical Exam  Filed Vitals:   08/22/13 1017  BP: 130/80  Pulse: 90  Temp: 97.9 F (30.7 C)   53 year old male in no acute distress.  NECK:  Supple.  Nontender.   HEART:  Appears to be regular.   LUNGS:  No crackles or wheezing audible.  Respirations even and unlabored.   RADIAL PULSE:  Equal bilaterally.  ABDOMEN:  Soft.  Nontender.  Bowel sounds present and  normal.  No audible abdominal bruit.  No reproducible tenderness to palpation.  RIBS.  No pain to palpation over the left lower anterior ribs.     EXTREMITIES:  No increased edema present.  DP pulses palpable and equal bilaterally.         Assessment & Plan:  CHEST/LOWER ANTERIOR RIB AND LUQ PAIN.  Not reproducible on exam.  Previous cxr negative.  CT chest revealed an indeterminate 1.91mm nodule within the right middle lobe along the periphery, an ill defined increased density in the base of the lingula, no further focal or acute abnormalities.  Specifically, the region of interest within the chest wall is unremarkable.  Dr Thelma Barge reviewed.  Felt no further w/up warranted at this time.  Recommended a follow up chest CT in one year from date of previous.  Is due for f/u chest CT.  Abdominal ultrasound that revealed a borderline enlarged spleen. Unclear as to the exact etiology.  Worsened by certain position.  Sitting upright - better.  Lying down better.  Dr Gavin Potters evaluated.  Recommended a trial of gabapentin.  Did not help.  Worsening pressure sensation.  Burning sensation worsened this past week.  Will obtain chest and abdominal CT for further evaluation.     HEALTH MAINTENANCE.  Physical 06/09/13.  PSA 06/10/13 .4.  Colonoscopy 08/30/10 recommended follow up colonoscopy 08/29/20.

## 2013-08-25 ENCOUNTER — Ambulatory Visit: Payer: Managed Care, Other (non HMO) | Admitting: Internal Medicine

## 2013-08-25 NOTE — Assessment & Plan Note (Signed)
Evaluated by Dr Achilles Dunkope.  Recommend a f/u in 6 months.  CT abdomen as outlined.

## 2013-08-25 NOTE — Assessment & Plan Note (Signed)
Blood pressure doing well.  Same medication.  Follow metabolic panel.   

## 2013-08-26 ENCOUNTER — Ambulatory Visit: Payer: Self-pay | Admitting: Internal Medicine

## 2013-09-05 ENCOUNTER — Encounter: Payer: Self-pay | Admitting: Internal Medicine

## 2013-10-26 ENCOUNTER — Encounter: Payer: Self-pay | Admitting: Internal Medicine

## 2013-11-15 ENCOUNTER — Ambulatory Visit (INDEPENDENT_AMBULATORY_CARE_PROVIDER_SITE_OTHER): Payer: Managed Care, Other (non HMO) | Admitting: Internal Medicine

## 2013-11-15 ENCOUNTER — Encounter: Payer: Self-pay | Admitting: Internal Medicine

## 2013-11-15 VITALS — BP 110/70 | HR 79 | Temp 97.9°F | Ht 73.0 in | Wt 221.2 lb

## 2013-11-15 DIAGNOSIS — I10 Essential (primary) hypertension: Secondary | ICD-10-CM

## 2013-11-15 DIAGNOSIS — N281 Cyst of kidney, acquired: Secondary | ICD-10-CM

## 2013-11-15 DIAGNOSIS — E7800 Pure hypercholesterolemia, unspecified: Secondary | ICD-10-CM

## 2013-11-15 DIAGNOSIS — Q619 Cystic kidney disease, unspecified: Secondary | ICD-10-CM

## 2013-11-15 NOTE — Progress Notes (Signed)
Pre visit review using our clinic review tool, if applicable. No additional management support is needed unless otherwise documented below in the visit note. 

## 2013-11-15 NOTE — Progress Notes (Signed)
Subjective:    Patient ID: Rodney Benjamin, male    DOB: 1961-05-14, 53 y.o.   MRN: [id number]  HPI 53 year old male with past history of hypertension and hypercholesterolemia who comes in today for a scheduled follow up.  Still having the lower ant chest/rib and upper LUQ pain.  States he has noticed that when he is sitting - he feels pressure in the upper left abdomen/ribs.  If he sits upright - better.  Lying down relieves.  Appears to be more positional.  He was very physically active this past weekend.  Some increased tightness in his back - from the increased physical labor.  Daughter massaged the area.  Better.  No chest pain or tightness with increased activity or exertion.  No sob.  Eating and drinking well.  No nausea or vomiting.  No bowel change.  [facility].  Recommended a trial of gabapentin.  Took for a while.  Did not help.  Recently had a CT chest/abdomen.  No findings to explain the pain.  Spleen normal.  Not enlarged as documented on the ultrasound.  Just evaluated by Dr Achilles Dunkope.  F/u for kidney cyst.  Felt stable.  No further w/up felt warranted.  He agreed the above lower chest discomfort appeared to be more musculoskeletal in nature.   Past Medical History  Diagnosis Date  . Hypertension   . Hypercholesterolemia   . History of chicken pox     Current Outpatient Prescriptions on File Prior to Visit  Medication Sig Dispense Refill  . doxycycline (DORYX) 100 MG EC tablet Take 100 mg by mouth daily.       Marland Kitchen. lisinopril (PRINIVIL,ZESTRIL) 10 MG tablet TAKE ONE TABLET BY MOUTH EVERY DAY  90 tablet  1   No current facility-administered medications on file prior to visit.    Review of Systems No sinus or allergy symptoms.  No chest pain, tightness or palpitations with increased activity or exertion.  No increased shortness of breath, cough or congestion.  No nausea or vomiting.  No acid reflux.  Left lower anterior rib/chest and LUQ pain.  See above.  No bowel change, such  as diarrhea, constipation, BRBPR or melana.  No urine change. Overall he feels he is doing well.       Objective:   Physical Exam  Filed Vitals:   11/15/13 1439  BP: 110/70  Pulse: 79  Temp: 97.9 F (36.6 C)   Blood pressure recheck:  54122/6882  53 year old male in no acute distress.  HEENT:  Nares clear.  Oropharynx without lesions.   NECK:  Supple.  Nontender.   HEART:  Appears to be regular.   LUNGS:  No crackles or wheezing audible.  Respirations even and unlabored.   RADIAL PULSE:  Equal bilaterally.  ABDOMEN:  Soft.  Nontender.  Bowel sounds present and normal.  No audible abdominal bruit.  No reproducible tenderness to palpation.  RIBS.  No pain to palpation over the left lower anterior ribs.     EXTREMITIES:  No increased edema present.  DP pulses palpable and equal bilaterally.         Assessment & Plan:  CHEST/LOWER ANTERIOR RIB AND LUQ PAIN.  Not reproducible on exam.  Previous cxr negative.  CT chest revealed an indeterminate 1.519mm nodule within the right middle lobe along the periphery, an ill defined increased density in the base of the lingula, no further focal or acute abnormalities.  Specifically, the region of interest within the chest  wall is unremarkable.  Dr Thelma Barge reviewed.  Felt no further w/up warranted at this time.  Recommended a follow up chest CT in one year from date of previous.  Abdominal ultrasound that revealed a borderline enlarged spleen. Unclear as to the exact etiology.  Pain worsened by certain positions.  Sitting upright - better.  Lying down better.  Dr Gavin Potters evaluated.  Recommended a trial of gabapentin.  Did not help.  Recent chest and abdominal CT revealed no acute abnormality.  Specifically no etiology found for the pain.  See CT report for details.  Discussed with him today.  Will hold on any further testing or evaluation.  Follow.  Will notify me if symptoms change.      HEALTH MAINTENANCE.  Physical 06/09/13.  PSA 06/10/13 .4.  Colonoscopy  08/30/10 recommended follow up colonoscopy 08/29/20.

## 2013-11-16 LAB — BASIC METABOLIC PANEL
BUN: 25 mg/dL — ABNORMAL HIGH (ref 6–23)
CO2: 28 mEq/L (ref 19–32)
Calcium: 9.3 mg/dL (ref 8.4–10.5)
Chloride: 103 mEq/L (ref 96–112)
Creatinine, Ser: 1.2 mg/dL (ref 0.4–1.5)
GFR: 67.2 mL/min (ref >60.00)
GLUCOSE: 87 mg/dL (ref 70–99)
POTASSIUM: 4.1 meq/L (ref 3.5–5.1)
SODIUM: 138 meq/L (ref 135–145)

## 2013-11-17 ENCOUNTER — Encounter: Payer: Self-pay | Admitting: Internal Medicine

## 2013-11-20 ENCOUNTER — Encounter: Payer: Self-pay | Admitting: Internal Medicine

## 2013-11-20 NOTE — Assessment & Plan Note (Signed)
Blood pressure doing well.  Same medication.  Follow metabolic panel.   

## 2013-11-20 NOTE — Assessment & Plan Note (Signed)
Evaluated by Dr Achilles Dunk.  CT abdomen as outlined.  Just saw Dr Achilles Dunk.  Felt things were stable.  No further w/up felt warranted at this time.  Follow.  Check renal function.

## 2013-11-20 NOTE — Assessment & Plan Note (Signed)
Low cholesterol diet and exercise.  Follow lipid panel.   

## 2013-11-30 ENCOUNTER — Encounter: Payer: Self-pay | Admitting: Orthopedic Surgery

## 2013-12-09 ENCOUNTER — Other Ambulatory Visit: Payer: Self-pay | Admitting: Internal Medicine

## 2013-12-21 ENCOUNTER — Encounter: Payer: Self-pay | Admitting: Orthopedic Surgery

## 2014-01-21 ENCOUNTER — Encounter: Payer: Self-pay | Admitting: Orthopedic Surgery

## 2014-03-13 ENCOUNTER — Other Ambulatory Visit: Payer: Self-pay | Admitting: Internal Medicine

## 2014-03-31 ENCOUNTER — Encounter: Payer: Self-pay | Admitting: Internal Medicine

## 2014-04-02 ENCOUNTER — Telehealth: Payer: Self-pay | Admitting: Internal Medicine

## 2014-04-02 NOTE — Telephone Encounter (Signed)
My chart message sent to inform pt colonoscopy report to be sent.

## 2014-05-22 ENCOUNTER — Encounter: Payer: Self-pay | Admitting: Internal Medicine

## 2014-06-14 ENCOUNTER — Encounter: Payer: Self-pay | Admitting: Internal Medicine

## 2014-06-14 ENCOUNTER — Ambulatory Visit (INDEPENDENT_AMBULATORY_CARE_PROVIDER_SITE_OTHER): Payer: Managed Care, Other (non HMO) | Admitting: Internal Medicine

## 2014-06-14 VITALS — BP 118/78 | HR 90 | Temp 98.7°F | Resp 14 | Ht 73.0 in | Wt 222.5 lb

## 2014-06-14 DIAGNOSIS — Z125 Encounter for screening for malignant neoplasm of prostate: Secondary | ICD-10-CM

## 2014-06-14 DIAGNOSIS — Q6100 Congenital renal cyst, unspecified: Secondary | ICD-10-CM

## 2014-06-14 DIAGNOSIS — E7800 Pure hypercholesterolemia, unspecified: Secondary | ICD-10-CM

## 2014-06-14 DIAGNOSIS — I10 Essential (primary) hypertension: Secondary | ICD-10-CM

## 2014-06-14 DIAGNOSIS — R208 Other disturbances of skin sensation: Secondary | ICD-10-CM

## 2014-06-14 DIAGNOSIS — R200 Anesthesia of skin: Secondary | ICD-10-CM

## 2014-06-14 DIAGNOSIS — I498 Other specified cardiac arrhythmias: Secondary | ICD-10-CM

## 2014-06-14 DIAGNOSIS — N281 Cyst of kidney, acquired: Secondary | ICD-10-CM

## 2014-06-14 DIAGNOSIS — I4901 Ventricular fibrillation: Secondary | ICD-10-CM

## 2014-06-14 NOTE — Progress Notes (Signed)
Pre visit review using our clinic review tool, if applicable. No additional management support is needed unless otherwise documented below in the visit note. 

## 2014-06-14 NOTE — Progress Notes (Signed)
Subjective:    Patient ID: Rodney Benjamin, male    DOB: 08/29/1960, 53 y.o.   MRN: [id number]  HPI 52 year old male with past history of hypertension and hypercholesterolemia who comes in today to follow up on these issues as well as for a complete physical exam.   Still having the lower ant chest/rib and upper LUQ pain.  Pain radiates from his back and around.  States he has noticed that when he is sitting - he feels pressure in the upper left abdomen/ribs.  If he sits upright - better.  Lying down relieves.  Appears to be more positional.   Going to a massage therapist now.  Wants to see if this helps.  He has also noticed that when he turns his head - left arm numbness.  Was evaluated at [facility].  Xray.  Given prednisone.  Did not help.  Still notices.  But only in certain positions.   No chest pain or tightness with increased activity or exertion.  No sob.  Eating and drinking well.  No nausea or vomiting.  No bowel change.  Had a CT chest/abdomen.  No findings to explain the pain.  Spleen normal.  Not enlarged as documented on the ultrasound.  Just evaluated by Dr Jacqlyn Larsen.  F/u for kidney cyst.  Felt stable.  No further w/up felt warranted.  Has been evaluated extensively and is now a match for his son for kidney transplant.  He has also noticed some fluttering of his heart.  Occurred 3-4 weeks ago.  Lasts for approximately 3-4 minutes.  Has stopped vitamin supplements.  Cut back caffeine.  No symptoms in 2 weeks.  Just had heart evaluation at the medical center.  Negative.  Handling stress relatively well.     Past Medical History  Diagnosis Date  . Hypertension   . Hypercholesterolemia   . History of chicken pox     Current Outpatient Prescriptions on File Prior to Visit  Medication Sig Dispense Refill  . lisinopril (PRINIVIL,ZESTRIL) 10 MG tablet TAKE ONE TABLET BY MOUTH EVERY DAY 90 tablet 2   No current facility-administered medications on file prior to visit.    Review of Systems No  sinus or allergy symptoms.  No chest pain, tightness or palpitations with increased activity or exertion.  Had the previous palpitations as outlined.  No increased shortness of breath, cough or congestion.  No nausea or vomiting.  No acid reflux.  Left lower anterior rib/chest and LUQ pain - radiates around to back.   See above.  No bowel change, such as diarrhea, constipation, BRBPR or melana.  No urine change. Overall he feels he is doing well.  Handling stress well.  Numbness in left arm as outlined.  Having some low back pain.  No pain radiating down his leg.       Objective:   Physical Exam  Filed Vitals:   06/14/14 1312  BP: 118/78  Pulse: 90  Temp: 98.7 F (37.1 C)  Resp: 14   Blood pressure recheck:  120/84. Pulse 60  53 year old male in no acute distress.  HEENT:  Nares - clear.  Oropharynx - without lesions. NECK:  Supple.  Nontender.  No audible carotid bruit.  HEART:  Appears to be regular.   LUNGS:  No crackles or wheezing audible.  Respirations even and unlabored.   RADIAL PULSE:  Equal bilaterally.  ABDOMEN:  Soft.  Nontender.  Bowel sounds present and normal.  No audible abdominal bruit.  GU:  Normal descended testicles.  No palpable testicular nodules.   RECTAL:  Could not appreciate any palpable prostate nodules.  Heme negative.   EXTREMITIES:  No increased edema present.  DP pulses palpable and equal bilaterally.  BACK:  No pain to palpation over his spine.  Non tender.       Assessment & Plan:  1. Essential hypertension Blood pressure doing well.  Same medication.  Follow.  Check met b.   2. Renal cyst Has been evaluated by Dr Jacqlyn Larsen and by the transplant team.  Follow.  Stable.   - Basic metabolic panel; Future  3. Hypercholesterolemia Low  Cholesterol diet and exercise. .   - Lipid panel; Future - Hepatic function panel; Future  4. Fluttering heart Resolved now.  Asymptomatic.  Just had cardiac w/up.  Follow.  Avoid stimulants.   - TSH; Future  5.  Left arm numbness Positional with movement of his neck.  Had xray and prednisone.  Plans to f/u since no improvement.    6. Prostate cancer screening - PSA; Future  CHEST/LOWER ANTERIOR RIB AND LUQ PAIN.  Not reproducible on exam.  Previous cxr negative.  CT chest revealed an indeterminate 1.36m nodule within the right middle lobe along the periphery, an ill defined increased density in the base of the lingula, no further focal or acute abnormalities.  Specifically, the region of interest within the chest wall is unremarkable.  Dr OGenevive Bireviewed.  Felt no further w/up warranted at this time.  Recommended a follow up chest CT in one year from date of previous.  Abdominal ultrasound that revealed a borderline enlarged spleen. Unclear as to the exact etiology.  Pain worsened by certain positions.  Sitting upright - better.  Lying down better.  Dr KJefm Bryantevaluated.  Recommended a trial of gabapentin.  Did not help.  Recent chest and abdominal CT revealed no acute abnormality.  Specifically no etiology found for the pain.  See CT report for details.  Appears to be more msk in origin.  Check thoracic spine xray and L-S spine xray.  Further w/up pending results.  He is going to a massage therapist.     [place]  Physical today.    PSA 06/10/13 .4.   Schedule f/u psa.   Colonoscopy 08/30/10 recommended follow up colonoscopy 08/29/20.

## 2014-06-19 ENCOUNTER — Encounter: Payer: Self-pay | Admitting: Internal Medicine

## 2014-06-19 ENCOUNTER — Ambulatory Visit: Payer: Self-pay | Admitting: Internal Medicine

## 2014-06-19 DIAGNOSIS — I498 Other specified cardiac arrhythmias: Secondary | ICD-10-CM | POA: Insufficient documentation

## 2014-06-19 DIAGNOSIS — R200 Anesthesia of skin: Secondary | ICD-10-CM | POA: Insufficient documentation

## 2014-06-26 ENCOUNTER — Telehealth: Payer: Self-pay | Admitting: Internal Medicine

## 2014-06-26 DIAGNOSIS — R0789 Other chest pain: Secondary | ICD-10-CM

## 2014-06-26 DIAGNOSIS — M549 Dorsalgia, unspecified: Secondary | ICD-10-CM

## 2014-06-26 NOTE — Telephone Encounter (Signed)
Pt notified of thoracic spine xray results and lumbar spine xray results.

## 2014-06-28 NOTE — Telephone Encounter (Signed)
Unread mychart message mailed to patient 

## 2014-07-06 ENCOUNTER — Encounter: Payer: Self-pay | Admitting: Internal Medicine

## 2014-07-11 NOTE — Addendum Note (Signed)
Addended by: Charm BargesSCOTT, Hilari Wethington S on: 07/11/2014 04:57 AM   Modules accepted: Orders

## 2014-07-11 NOTE — Telephone Encounter (Signed)
Order placed for physical therapy.  

## 2014-07-17 ENCOUNTER — Other Ambulatory Visit (INDEPENDENT_AMBULATORY_CARE_PROVIDER_SITE_OTHER): Payer: Managed Care, Other (non HMO)

## 2014-07-17 ENCOUNTER — Encounter: Payer: Self-pay | Admitting: Internal Medicine

## 2014-07-17 ENCOUNTER — Other Ambulatory Visit: Payer: Managed Care, Other (non HMO)

## 2014-07-17 DIAGNOSIS — I498 Other specified cardiac arrhythmias: Secondary | ICD-10-CM

## 2014-07-17 DIAGNOSIS — N281 Cyst of kidney, acquired: Secondary | ICD-10-CM

## 2014-07-17 DIAGNOSIS — E7800 Pure hypercholesterolemia, unspecified: Secondary | ICD-10-CM

## 2014-07-17 DIAGNOSIS — Q6100 Congenital renal cyst, unspecified: Secondary | ICD-10-CM

## 2014-07-17 DIAGNOSIS — Z125 Encounter for screening for malignant neoplasm of prostate: Secondary | ICD-10-CM

## 2014-07-17 DIAGNOSIS — I4901 Ventricular fibrillation: Secondary | ICD-10-CM

## 2014-07-17 LAB — LIPID PANEL
CHOL/HDL RATIO: 5
CHOLESTEROL: 177 mg/dL (ref 0–200)
HDL: 37.7 mg/dL — ABNORMAL LOW (ref >39.00)
LDL CALC: 116 mg/dL — AB (ref 0–99)
NonHDL: 139.3
Triglycerides: 118 mg/dL (ref 0.0–149.0)
VLDL: 23.6 mg/dL (ref 0.0–40.0)

## 2014-07-17 LAB — HEPATIC FUNCTION PANEL
ALBUMIN: 4.3 g/dL (ref 3.5–5.2)
ALT: 36 U/L (ref 0–53)
AST: 25 U/L (ref 0–37)
Alkaline Phosphatase: 52 U/L (ref 39–117)
BILIRUBIN TOTAL: 0.9 mg/dL (ref 0.2–1.2)
Bilirubin, Direct: 0.1 mg/dL (ref 0.0–0.3)
Total Protein: 7.1 g/dL (ref 6.0–8.3)

## 2014-07-17 LAB — BASIC METABOLIC PANEL
BUN: 17 mg/dL (ref 6–23)
CALCIUM: 9.5 mg/dL (ref 8.4–10.5)
CO2: 28 mEq/L (ref 19–32)
Chloride: 102 mEq/L (ref 96–112)
Creatinine, Ser: 1.13 mg/dL (ref 0.40–1.50)
GFR: 71.84 mL/min (ref >60.00)
Glucose, Bld: 89 mg/dL (ref 70–99)
Potassium: 4.2 mEq/L (ref 3.5–5.1)
SODIUM: 138 meq/L (ref 135–145)

## 2014-07-17 LAB — TSH: TSH: 1.06 u[IU]/mL (ref 0.35–4.50)

## 2014-07-17 LAB — PSA: PSA: 0.61 ng/mL (ref 0.10–4.00)

## 2014-07-21 NOTE — Telephone Encounter (Signed)
Unread mychart message mailed to patient 

## 2014-08-01 ENCOUNTER — Other Ambulatory Visit: Payer: Managed Care, Other (non HMO)

## 2014-08-08 ENCOUNTER — Encounter: Payer: Managed Care, Other (non HMO) | Admitting: Internal Medicine

## 2014-10-16 ENCOUNTER — Ambulatory Visit (INDEPENDENT_AMBULATORY_CARE_PROVIDER_SITE_OTHER): Payer: Managed Care, Other (non HMO) | Admitting: Internal Medicine

## 2014-10-16 ENCOUNTER — Encounter: Payer: Self-pay | Admitting: Internal Medicine

## 2014-10-16 VITALS — BP 118/78 | HR 75 | Temp 98.6°F | Ht 73.0 in | Wt 220.2 lb

## 2014-10-16 DIAGNOSIS — R200 Anesthesia of skin: Secondary | ICD-10-CM

## 2014-10-16 DIAGNOSIS — I10 Essential (primary) hypertension: Secondary | ICD-10-CM | POA: Diagnosis not present

## 2014-10-16 DIAGNOSIS — E7800 Pure hypercholesterolemia, unspecified: Secondary | ICD-10-CM

## 2014-10-16 DIAGNOSIS — N281 Cyst of kidney, acquired: Secondary | ICD-10-CM

## 2014-10-16 DIAGNOSIS — M546 Pain in thoracic spine: Secondary | ICD-10-CM

## 2014-10-16 DIAGNOSIS — Z0000 Encounter for general adult medical examination without abnormal findings: Secondary | ICD-10-CM

## 2014-10-16 DIAGNOSIS — Q6100 Congenital renal cyst, unspecified: Secondary | ICD-10-CM

## 2014-10-16 DIAGNOSIS — R208 Other disturbances of skin sensation: Secondary | ICD-10-CM | POA: Diagnosis not present

## 2014-10-16 NOTE — Progress Notes (Signed)
Pre visit review using our clinic review tool, if applicable. No additional management support is needed unless otherwise documented below in the visit note. 

## 2014-10-24 ENCOUNTER — Encounter: Payer: Self-pay | Admitting: Internal Medicine

## 2014-10-24 DIAGNOSIS — Z0000 Encounter for general adult medical examination without abnormal findings: Secondary | ICD-10-CM | POA: Insufficient documentation

## 2014-10-24 DIAGNOSIS — M549 Dorsalgia, unspecified: Secondary | ICD-10-CM | POA: Insufficient documentation

## 2014-10-24 NOTE — Assessment & Plan Note (Signed)
Low cholesterol diet and exercise.  Follow lipid apnel.

## 2014-10-24 NOTE — Assessment & Plan Note (Signed)
The previous chest sensation has improved.  Has persistent back sensation that radiates around left chest.  Had xray - arthritis changes. Discussed further w/up.  Discussed possible MRI.  Wants to pursue neck w/up first.  Will follow.

## 2014-10-24 NOTE — Assessment & Plan Note (Signed)
Physical 06/14/14.  Colonoscopy due 08/29/20.  PSA 07/17/14 - .61.

## 2014-10-24 NOTE — Assessment & Plan Note (Signed)
Persistent tingling in thumb.  Working on strengthening his neck.  [facility]  Planning for MRI soon.  Further w/up pending results.

## 2014-10-24 NOTE — Assessment & Plan Note (Signed)
Evaluated by Dr Achilles Dunkope.  Felt things were stable.  Follow renal function.

## 2014-10-24 NOTE — Assessment & Plan Note (Signed)
Blood pressure has been under good control.  Lower than previous.  Discussed decreasing the dose of lisinopril.  Consider 5mg  q day.  Will follow pressures.  Send in readings.  Follow metabolic panel.

## 2014-10-24 NOTE — Progress Notes (Signed)
Patient ID: Rodney DeitersDarryl D Benjamin, male   DOB: 1960/10/30, 54 y.o.   MRN: [id number]   Subjective:    Patient ID: Rodney DeitersDarryl D Benjamin, male    DOB: 1960/10/30, 54 y.o.   MRN: [id number]  HPI  Patient here for a scheduled follow up.  Still with tingling - thumb.  [facility].  Working on exercise.  Planning MRI c-spine soon.  They are scheduling.  The previous chest sensation has improved/resolved.  Back sensation change - persists.  No cardiac symptoms with increased activity or exertion.  Breathing stable.  Eating and drinking well.  Bowels stable.  Handling stress well.     Past Medical History  Diagnosis Date  . Hypertension   . Hypercholesterolemia   . History of chicken pox     Current Outpatient Prescriptions on File Prior to Visit  Medication Sig Dispense Refill  . lisinopril (PRINIVIL,ZESTRIL) 10 MG tablet TAKE ONE TABLET BY MOUTH EVERY DAY 90 tablet 2   No current facility-administered medications on file prior to visit.    Review of Systems  Constitutional: Negative for appetite change and unexpected weight change.  HENT: Negative for congestion and sinus pressure.   Respiratory: Negative for cough, chest tightness and shortness of breath.   Cardiovascular: Negative for chest pain, palpitations and leg swelling.  Gastrointestinal: Negative for nausea, vomiting, abdominal pain and diarrhea.  Genitourinary: Negative for dysuria and difficulty urinating.  Musculoskeletal: Positive for back pain (sensation change - mid back. ). Negative for joint swelling.  Skin: Negative for color change and rash.  Neurological: Negative for dizziness, light-headedness and headaches.  Hematological: Negative for adenopathy. Does not bruise/bleed easily.  Psychiatric/Behavioral: Negative for dysphoric mood and agitation.       Objective:    Physical Exam  Constitutional: He appears well-developed and well-nourished. No distress.  HENT:  Nose: Nose normal.  Mouth/Throat:  Oropharynx is clear and moist.  Neck: Neck supple. No thyromegaly present.  Cardiovascular: Normal rate and regular rhythm.   Pulmonary/Chest: Effort normal and breath sounds normal. No respiratory distress.  Abdominal: Soft. Bowel sounds are normal. There is no tenderness.  Musculoskeletal: He exhibits no edema.  Lymphadenopathy:    He has no cervical adenopathy.  Skin: No rash noted. No erythema.  Psychiatric: He has a normal mood and affect. His behavior is normal.    BP 118/78 mmHg  Pulse 75  Temp(Src) 98.6 F (37 C) (Oral)  Ht 6\' 1"  (1.854 m)  Wt 220 lb 4 oz (99.905 kg)  BMI 29.06 kg/m2  SpO2 97% Wt Readings from Last 3 Encounters:  10/16/14 220 lb 4 oz (99.905 kg)  06/14/14 222 lb 8 oz (100.925 kg)  11/15/13 221 lb 4 oz (100.358 kg)     Lab Results  Component Value Date   WBC 5.4 06/10/2013   HGB 14.8 06/10/2013   HCT 43.6 06/10/2013   PLT 168.0 06/10/2013   GLUCOSE 89 07/17/2014   CHOL 177 07/17/2014   TRIG 118.0 07/17/2014   HDL 37.70* 07/17/2014   LDLCALC 116* 07/17/2014   ALT 36 07/17/2014   AST 25 07/17/2014   NA 138 07/17/2014   K 4.2 07/17/2014   CL 102 07/17/2014   CREATININE 1.13 07/17/2014   BUN 17 07/17/2014   CO2 28 07/17/2014   TSH 1.06 07/17/2014   PSA 0.61 07/17/2014       Assessment & Plan:   Problem List Items Addressed This Visit    Back pain    The previous  chest sensation has improved.  Has persistent back sensation that radiates around left chest.  Had xray - arthritis changes. Discussed further w/up.  Discussed possible MRI.  Wants to pursue neck w/up first.  Will follow.        Health care maintenance    Physical 06/14/14.  Colonoscopy due 08/29/20.  PSA 07/17/14 - .61.        Hypercholesterolemia    Low cholesterol diet and exercise.  Follow lipid apnel.       Relevant Orders   Lipid panel   Hepatic function panel   Hypertension - Primary    Blood pressure has been under good control.  Lower than previous.  Discussed  decreasing the dose of lisinopril.  Consider  q day.  Will follow pressures.  Send in readings.  Follow metabolic panel.       Relevant Orders   Basic metabolic panel   Left arm numbness    Persistent tingling in thumb.  Working on strengthening his neck.  Evaluated at [facility].  Planning for MRI soon.  Further w/up pending results.        Relevant Orders   CBC with Differential/Platelet   Renal cyst    Evaluated by Dr Achilles Dunk.  Felt things were stable.  Follow renal function.         I spent 25 minutes with the patient and more than 50% of the time was spent in consultation regarding the above.     Dale [place], MD

## 2014-11-16 ENCOUNTER — Other Ambulatory Visit (INDEPENDENT_AMBULATORY_CARE_PROVIDER_SITE_OTHER): Payer: Managed Care, Other (non HMO)

## 2014-11-16 DIAGNOSIS — I10 Essential (primary) hypertension: Secondary | ICD-10-CM

## 2014-11-16 DIAGNOSIS — E7800 Pure hypercholesterolemia, unspecified: Secondary | ICD-10-CM

## 2014-11-16 DIAGNOSIS — R208 Other disturbances of skin sensation: Secondary | ICD-10-CM | POA: Diagnosis not present

## 2014-11-16 DIAGNOSIS — R200 Anesthesia of skin: Secondary | ICD-10-CM

## 2014-11-16 LAB — CBC WITH DIFFERENTIAL/PLATELET
BASOS PCT: 0.6 % (ref 0.0–3.0)
Basophils Absolute: 0 10*3/uL (ref 0.0–0.1)
EOS PCT: 1.4 % (ref 0.0–5.0)
Eosinophils Absolute: 0.1 10*3/uL (ref 0.0–0.7)
HCT: 44.7 % (ref 39.0–52.0)
Hemoglobin: 15.3 g/dL (ref 13.0–17.0)
Lymphocytes Relative: 28.1 % (ref 12.0–46.0)
Lymphs Abs: 1.5 10*3/uL (ref 0.7–4.0)
MCHC: 34.3 g/dL (ref 30.0–36.0)
MCV: 90.8 fl (ref 78.0–100.0)
Monocytes Absolute: 0.4 10*3/uL (ref 0.1–1.0)
Monocytes Relative: 7.9 % (ref 3.0–12.0)
NEUTROS PCT: 62 % (ref 43.0–77.0)
Neutro Abs: 3.3 10*3/uL (ref 1.4–7.7)
Platelets: 175 10*3/uL (ref 150.0–400.0)
RBC: 4.92 Mil/uL (ref 4.22–5.81)
RDW: 12.8 % (ref 11.5–15.5)
WBC: 5.3 10*3/uL (ref 4.0–10.5)

## 2014-11-16 LAB — HEPATIC FUNCTION PANEL
ALBUMIN: 4.2 g/dL (ref 3.5–5.2)
ALK PHOS: 47 U/L (ref 39–117)
ALT: 23 U/L (ref 0–53)
AST: 17 U/L (ref 0–37)
BILIRUBIN DIRECT: 0.2 mg/dL (ref 0.0–0.3)
BILIRUBIN TOTAL: 0.8 mg/dL (ref 0.2–1.2)
Total Protein: 6.9 g/dL (ref 6.0–8.3)

## 2014-11-16 LAB — BASIC METABOLIC PANEL
BUN: 17 mg/dL (ref 6–23)
CHLORIDE: 103 meq/L (ref 96–112)
CO2: 30 meq/L (ref 19–32)
Calcium: 9.2 mg/dL (ref 8.4–10.5)
Creatinine, Ser: 1.17 mg/dL (ref 0.40–1.50)
GFR: 68.93 mL/min (ref >60.00)
Glucose, Bld: 97 mg/dL (ref 70–99)
Potassium: 4.1 mEq/L (ref 3.5–5.1)
Sodium: 136 mEq/L (ref 135–145)

## 2014-11-16 LAB — LIPID PANEL
CHOL/HDL RATIO: 4
Cholesterol: 156 mg/dL (ref 0–200)
HDL: 39.7 mg/dL (ref >39.00)
LDL Cholesterol: 99 mg/dL (ref 0–99)
NonHDL: 116.3
Triglycerides: 86 mg/dL (ref 0.0–149.0)
VLDL: 17.2 mg/dL (ref 0.0–40.0)

## 2014-11-18 ENCOUNTER — Encounter: Payer: Self-pay | Admitting: Internal Medicine

## 2014-12-25 ENCOUNTER — Encounter: Payer: Self-pay | Admitting: Internal Medicine

## 2014-12-25 DIAGNOSIS — M5020 Other cervical disc displacement, unspecified cervical region: Secondary | ICD-10-CM | POA: Insufficient documentation

## 2014-12-29 ENCOUNTER — Other Ambulatory Visit: Payer: Self-pay | Admitting: Internal Medicine

## 2015-01-16 ENCOUNTER — Encounter: Payer: Self-pay | Admitting: Internal Medicine

## 2015-01-17 NOTE — Telephone Encounter (Signed)
Appoint date and [facility] to [facility] schedule and contact pt.

## 2015-01-17 NOTE — Telephone Encounter (Signed)
I can see him 01/30/15 at 4:30.  Please schedule and notify pt of appt date and time.  Thanks.   Dr Lorin Picket

## 2015-01-30 ENCOUNTER — Ambulatory Visit (INDEPENDENT_AMBULATORY_CARE_PROVIDER_SITE_OTHER): Payer: Managed Care, Other (non HMO) | Admitting: Internal Medicine

## 2015-01-30 ENCOUNTER — Encounter: Payer: Self-pay | Admitting: Internal Medicine

## 2015-01-30 VITALS — BP 108/72 | HR 93 | Temp 98.2°F | Ht 73.0 in | Wt 210.6 lb

## 2015-01-30 DIAGNOSIS — I10 Essential (primary) hypertension: Secondary | ICD-10-CM

## 2015-01-30 DIAGNOSIS — R208 Other disturbances of skin sensation: Secondary | ICD-10-CM

## 2015-01-30 DIAGNOSIS — M546 Pain in thoracic spine: Secondary | ICD-10-CM | POA: Diagnosis not present

## 2015-01-30 DIAGNOSIS — R200 Anesthesia of skin: Secondary | ICD-10-CM

## 2015-01-30 DIAGNOSIS — E7800 Pure hypercholesterolemia, unspecified: Secondary | ICD-10-CM

## 2015-01-30 DIAGNOSIS — N281 Cyst of kidney, acquired: Secondary | ICD-10-CM

## 2015-01-30 DIAGNOSIS — R634 Abnormal weight loss: Secondary | ICD-10-CM

## 2015-01-30 DIAGNOSIS — Q6100 Congenital renal cyst, unspecified: Secondary | ICD-10-CM

## 2015-01-30 DIAGNOSIS — M5020 Other cervical disc displacement, unspecified cervical region: Secondary | ICD-10-CM | POA: Diagnosis not present

## 2015-01-30 NOTE — Progress Notes (Signed)
Patient ID: CONSTANTIN HILLERY, male   DOB: [birth year], 54 y.o.   MRN: [id number]   Subjective:    Patient ID: DEJON LUKAS, male    DOB: 04-Dec-1960, 54 y.o.   MRN: [id number]  HPI  Patient here for a scheduled follow up.  He recently had an anterior cervical discectomy and fusion with bone allograft and plating.  Is recovering well.  Pain is better.  Not requiring pain medications.  Has f/u in the next few weeks.  No heavy lifting.  Cannot lift more than 10 pounds.  Has been walking.  No cardiac symptoms with increased activity or exertion.  No sob.  Pain under left shoulder blade - better.  No abdominal pain or cramping.  Bowels stable.  Has lost weight.  He reports he has adjusted his diet.  No nausea or vomiting.     Past Medical History  Diagnosis Date  . Hypertension   . Hypercholesterolemia   . History of chicken pox    Family history and social history reviewed.    Outpatient Encounter Prescriptions as of 01/30/2015  Medication Sig  . lisinopril (PRINIVIL,ZESTRIL) 10 MG tablet TAKE ONE TABLET BY MOUTH EVERY DAY  . [DISCONTINUED] cephALEXin (KEFLEX) 500 MG capsule Take 500 mg by mouth 3 (three) times daily.   No facility-administered encounter medications on file as of 01/30/2015.    Review of Systems  Constitutional:       Has lost weight.  Has adjusted his diet.  Eating better.   HENT: Negative for congestion and sinus pressure.   Respiratory: Negative for cough, chest tightness and shortness of breath.   Cardiovascular: Negative for chest pain, palpitations and leg swelling.  Gastrointestinal: Negative for nausea, vomiting, abdominal pain and diarrhea.  Genitourinary: Negative for dysuria and difficulty urinating.  Musculoskeletal:       Neck and arm pain - better.  Pain under shoulder blade - better.    Skin: Negative for color change and rash.  Neurological: Negative for dizziness, light-headedness and headaches.  Psychiatric/Behavioral: Negative for dysphoric  mood and agitation.       Objective:    Physical Exam  Constitutional: He appears well-developed and well-nourished. No distress.  HENT:  Nose: Nose normal.  Mouth/Throat: Oropharynx is clear and moist.  Neck: Neck supple. No thyromegaly present.  Cardiovascular: Normal rate and regular rhythm.   Pulmonary/Chest: Effort normal and breath sounds normal. No respiratory distress.  Abdominal: Soft. Bowel sounds are normal. There is no tenderness.  Musculoskeletal: He exhibits no edema or tenderness.  Lymphadenopathy:    He has no cervical adenopathy.  Skin: No rash noted. No erythema.  Psychiatric: He has a normal mood and affect. His behavior is normal.    BP 108/72 mmHg  Pulse 93  Temp(Src) 98.2 F (36.8 C) (Oral)  Ht 6\' 1"  (1.854 m)  Wt 210 lb 9.6 oz (95.528 kg)  BMI 27.79 kg/m2  SpO2 97% Wt Readings from Last 3 Encounters:  01/30/15 210 lb 9.6 oz (95.528 kg)  10/16/14 220 lb 4 oz (99.905 kg)  06/14/14 222 lb 8 oz (100.925 kg)     Lab Results  Component Value Date   WBC 5.3 11/16/2014   HGB 15.3 11/16/2014   HCT 44.7 11/16/2014   PLT 175.0 11/16/2014   GLUCOSE 97 11/16/2014   CHOL 156 11/16/2014   TRIG 86.0 11/16/2014   HDL 39.70 11/16/2014   LDLCALC 99 11/16/2014   ALT 23 11/16/2014   AST 17 11/16/2014   NA  136 11/16/2014   K 4.1 11/16/2014   CL 103 11/16/2014   CREATININE 1.17 11/16/2014   BUN 17 11/16/2014   CO2 30 11/16/2014   TSH 1.06 07/17/2014   PSA 0.61 07/17/2014       Assessment & Plan:   Problem List Items Addressed This Visit    Back pain    Back pain is better s/p surgery.  Follow.       Cervical disc herniation    Is s/p cervical discectomy.  Being followed by neurosurgery.  Neck and arm pain better.  Requiring no pain medication.  Follow.        Hypercholesterolemia    Low cholesterol diet and exercise.  Follow lipid panel.       Hypertension - Primary    Blood pressure under good control.  Continue same medication regimen.   Follow pressures.  Follow metabolic panel.        Left arm numbness    Better since s/p neck surgery.  Follow.        Loss of weight   Renal cyst    Evaluated by Dr Achilles Dunk.  Felt things were stable.  Renal function checked 11/2014 - stable.            Dale Short Pump, MD

## 2015-01-30 NOTE — Progress Notes (Signed)
Pre visit review using our clinic review tool, if applicable. No additional management support is needed unless otherwise documented below in the visit note. 

## 2015-01-31 ENCOUNTER — Encounter: Payer: Self-pay | Admitting: Internal Medicine

## 2015-01-31 DIAGNOSIS — R634 Abnormal weight loss: Secondary | ICD-10-CM | POA: Insufficient documentation

## 2015-01-31 NOTE — Assessment & Plan Note (Signed)
Better since s/p neck surgery.  Follow.

## 2015-01-31 NOTE — Assessment & Plan Note (Signed)
Back pain is better s/p surgery.  Follow.

## 2015-01-31 NOTE — Assessment & Plan Note (Signed)
Blood pressure under good control.  Continue same medication regimen.  Follow pressures.  Follow metabolic panel.   

## 2015-01-31 NOTE — Assessment & Plan Note (Signed)
Evaluated by Dr Achilles Dunk.  Felt things were stable.  Renal function checked 11/2014 - stable.

## 2015-01-31 NOTE — Assessment & Plan Note (Signed)
Low cholesterol diet and exercise.  Follow lipid panel.   

## 2015-01-31 NOTE — Assessment & Plan Note (Signed)
Is s/p cervical discectomy.  Being followed by neurosurgery.  Neck and arm pain better.  Requiring no pain medication.  Follow.

## 2015-02-15 ENCOUNTER — Ambulatory Visit: Payer: Managed Care, Other (non HMO) | Admitting: Internal Medicine

## 2015-06-04 ENCOUNTER — Other Ambulatory Visit: Payer: Self-pay | Admitting: Internal Medicine

## 2015-07-17 ENCOUNTER — Ambulatory Visit (INDEPENDENT_AMBULATORY_CARE_PROVIDER_SITE_OTHER): Payer: Managed Care, Other (non HMO)

## 2015-07-17 DIAGNOSIS — Z23 Encounter for immunization: Secondary | ICD-10-CM

## 2015-07-20 ENCOUNTER — Other Ambulatory Visit: Payer: Self-pay

## 2015-07-20 NOTE — Telephone Encounter (Signed)
Need to clarify with pt who has been prescribing and when started.  If someone else prescribing, may need to get from that MD

## 2015-07-20 NOTE — Telephone Encounter (Signed)
Spoke with the patient, he doesn't need Protonix he went to a dermalogist yesterday in [place] and requested a refill on his Metronidazole  Topical lotion for his face.  I spoke with the pharmacy and they are not sure where the prescription came from.  Thanks

## 2015-07-20 NOTE — Telephone Encounter (Signed)
Received fax from [facility], requesting refill on Protonix, not on list of medications, Please advise?

## 2015-07-20 NOTE — Telephone Encounter (Signed)
So, he doesn't need anything then?  If saw dermatology yesterday, I assume they gave rx for metronidazole.

## 2015-08-17 ENCOUNTER — Encounter: Payer: Managed Care, Other (non HMO) | Admitting: Internal Medicine

## 2015-09-24 ENCOUNTER — Ambulatory Visit (INDEPENDENT_AMBULATORY_CARE_PROVIDER_SITE_OTHER): Payer: Managed Care, Other (non HMO) | Admitting: Internal Medicine

## 2015-09-24 ENCOUNTER — Encounter: Payer: Self-pay | Admitting: Internal Medicine

## 2015-09-24 VITALS — BP 138/70 | HR 78 | Temp 97.8°F | Resp 18 | Ht 72.0 in | Wt 218.2 lb

## 2015-09-24 DIAGNOSIS — I10 Essential (primary) hypertension: Secondary | ICD-10-CM | POA: Diagnosis not present

## 2015-09-24 DIAGNOSIS — Q6100 Congenital renal cyst, unspecified: Secondary | ICD-10-CM | POA: Diagnosis not present

## 2015-09-24 DIAGNOSIS — Z0000 Encounter for general adult medical examination without abnormal findings: Secondary | ICD-10-CM | POA: Diagnosis not present

## 2015-09-24 DIAGNOSIS — M546 Pain in thoracic spine: Secondary | ICD-10-CM

## 2015-09-24 DIAGNOSIS — R200 Anesthesia of skin: Secondary | ICD-10-CM

## 2015-09-24 DIAGNOSIS — R208 Other disturbances of skin sensation: Secondary | ICD-10-CM

## 2015-09-24 DIAGNOSIS — E7800 Pure hypercholesterolemia, unspecified: Secondary | ICD-10-CM

## 2015-09-24 DIAGNOSIS — M5020 Other cervical disc displacement, unspecified cervical region: Secondary | ICD-10-CM | POA: Diagnosis not present

## 2015-09-24 DIAGNOSIS — Z125 Encounter for screening for malignant neoplasm of prostate: Secondary | ICD-10-CM

## 2015-09-24 DIAGNOSIS — N281 Cyst of kidney, acquired: Secondary | ICD-10-CM

## 2015-09-24 NOTE — Progress Notes (Signed)
Patient ID: Rodney Benjamin, male   DOB: January 21, 1961, 55 y.o.   MRN: [id number]   Subjective:    Patient ID: Rodney Benjamin, male    DOB: 06-29-1960, 55 y.o.   MRN: [id number]  HPI  Patient here for his physical exam.  Is s/p discectomy and fusion - anterior cervical.  He is trying to stay active.  No cardiac symptoms with increased activity or exertion.  No sob.  No acid reflux.  No abdominal pain or cramping.  Bowels stable.  Reports some increased discomfort - back. Pain extends around left side - under breast.  Stable.  Up right - better.  Stretching out - better.  Increased stress.  Son on transplant list for kidney.  He is going to be the doner.  Overall feels he is handling things well.  Does not feel needs anything more at this point.     Past Medical History  Diagnosis Date  . Hypertension   . Hypercholesterolemia   . History of chicken pox    Past Surgical History  Procedure Laterality Date  . Knee surgery  1982    right  . Inner ear surgery      cholesteatoma removed x 2  . Tonsillectomy     Family History  Problem Relation Age of Onset  . Prostate cancer    . Hypertension    . Breast cancer     Social History   Social History  . Marital Status: Married    Spouse Name: N/A  . Number of Children: N/A  . Years of Education: N/A   Social History Main Topics  . Smoking status: Never Smoker   . Smokeless tobacco: Never Used  . Alcohol Use: No  . Drug Use: No  . Sexual Activity: Not Asked   Other Topics Concern  . None   Social History Narrative    Outpatient Encounter Prescriptions as of 09/24/2015  Medication Sig  . lisinopril (PRINIVIL,ZESTRIL) 10 MG tablet TAKE ONE TABLET BY MOUTH EVERY DAY   No facility-administered encounter medications on file as of 09/24/2015.    Review of Systems  Constitutional: Negative for appetite change and unexpected weight change.  HENT: Negative for congestion and sinus pressure.   Eyes: Negative for pain and  visual disturbance.  Respiratory: Negative for cough, chest tightness and shortness of breath.   Cardiovascular: Negative for chest pain, palpitations and leg swelling.  Gastrointestinal: Negative for nausea, vomiting, abdominal pain and diarrhea.  Genitourinary: Negative for dysuria and difficulty urinating.  Musculoskeletal: Positive for back pain. Negative for joint swelling.  Skin: Negative for color change and rash.  Neurological: Negative for dizziness, light-headedness and headaches.  Hematological: Negative for adenopathy. Does not bruise/bleed easily.  Psychiatric/Behavioral: Negative for dysphoric mood and agitation.       Objective:    Physical Exam  Constitutional: He is oriented to person, place, and time. He appears well-developed and well-nourished. No distress.  HENT:  Head: Normocephalic and atraumatic.  Nose: Nose normal.  Mouth/Throat: Oropharynx is clear and moist. No oropharyngeal exudate.  Eyes: Conjunctivae are normal. Right eye exhibits no discharge. Left eye exhibits no discharge.  Neck: Neck supple. No thyromegaly present.  Cardiovascular: Normal rate and regular rhythm.   Pulmonary/Chest: Breath sounds normal. No respiratory distress. He has no wheezes.  Abdominal: Soft. Bowel sounds are normal. There is no tenderness.  Musculoskeletal: He exhibits no edema or tenderness.  Lymphadenopathy:    He has no cervical adenopathy.  Neurological: He is  alert and oriented to person, place, and time.  Skin: Skin is warm and dry. No rash noted.  Psychiatric: He has a normal mood and affect. His behavior is normal.    BP 138/70 mmHg  Pulse 78  Temp(Src) 97.8 F (36.6 C) (Oral)  Resp 18  Ht 6' (1.829 m)  Wt 218 lb 4 oz (98.998 kg)  BMI 29.59 kg/m2  SpO2 96% Wt Readings from Last 3 Encounters:  09/24/15 218 lb 4 oz (98.998 kg)  01/30/15 210 lb 9.6 oz (95.528 kg)  10/16/14 220 lb 4 oz (99.905 kg)     Lab Results  Component Value Date   WBC 5.3 11/16/2014    HGB 15.3 11/16/2014   HCT 44.7 11/16/2014   PLT 175.0 11/16/2014   GLUCOSE 97 11/16/2014   CHOL 156 11/16/2014   TRIG 86.0 11/16/2014   HDL 39.70 11/16/2014   LDLCALC 99 11/16/2014   ALT 23 11/16/2014   AST 17 11/16/2014   NA 136 11/16/2014   K 4.1 11/16/2014   CL 103 11/16/2014   CREATININE 1.17 11/16/2014   BUN 17 11/16/2014   CO2 30 11/16/2014   TSH 1.06 07/17/2014   PSA 0.61 07/17/2014        Assessment & Plan:   Problem List Items Addressed This Visit    Back pain    Discussed further w/up.  He wants to hold on further w/up.  Follow.        Cervical disc herniation    S/p discectomy and fusion.  Doing better.  Still with some back pain.  Feels better up right.  Follow.  Desires no further w/up at this point.  Follow.       Health care maintenance    Physical today 09/24/15.  Colonoscopy due 08/29/20.  PSA 07/17/14 - .61.  Check psa with next labs.        Hypercholesterolemia    Low cholesterol diet and exercise.  Follow lipid panel.       Relevant Orders   Lipid panel   Hepatic function panel   Hypertension    Blood pressure under good control.  Continue same medication regimen.  Follow pressures.  Follow metabolic panel.        Relevant Orders   CBC with Differential/Platelet   TSH   Basic metabolic panel   Left arm numbness    Left arm numbness better after surgery.        Renal cyst    Evaluated by Dr Achilles Dunkope.  Felt things were stable.  Follow renal function.         Other Visit Diagnoses    Routine general medical examination at a health care facility    -  Primary    Prostate cancer screening        Relevant Orders    PSA        Dale DurhamSCOTT, Clarrisa Kaylor, MD

## 2015-09-24 NOTE — Progress Notes (Signed)
Pre-visit discussion using our clinic review tool. No additional management support is needed unless otherwise documented below in the visit note.  

## 2015-09-30 ENCOUNTER — Encounter: Payer: Self-pay | Admitting: Internal Medicine

## 2015-09-30 NOTE — Assessment & Plan Note (Signed)
Discussed further w/up.  He wants to hold on further w/up.  Follow.

## 2015-09-30 NOTE — Assessment & Plan Note (Signed)
Physical today 09/24/15.  Colonoscopy due 08/29/20.  PSA 07/17/14 - .61.  Check psa with next labs.

## 2015-09-30 NOTE — Assessment & Plan Note (Signed)
Low cholesterol diet and exercise.  Follow lipid panel.   

## 2015-09-30 NOTE — Assessment & Plan Note (Signed)
Blood pressure under good control.  Continue same medication regimen.  Follow pressures.  Follow metabolic panel.   

## 2015-09-30 NOTE — Assessment & Plan Note (Signed)
Left arm numbness better after surgery.

## 2015-09-30 NOTE — Assessment & Plan Note (Signed)
S/p discectomy and fusion.  Doing better.  Still with some back pain.  Feels better up right.  Follow.  Desires no further w/up at this point.  Follow.

## 2015-09-30 NOTE — Assessment & Plan Note (Signed)
Evaluated by Dr Achilles Dunkope.  Felt things were stable.  Follow renal function.

## 2015-10-03 ENCOUNTER — Other Ambulatory Visit (INDEPENDENT_AMBULATORY_CARE_PROVIDER_SITE_OTHER): Payer: Managed Care, Other (non HMO)

## 2015-10-03 ENCOUNTER — Encounter: Payer: Self-pay | Admitting: Internal Medicine

## 2015-10-03 DIAGNOSIS — Z125 Encounter for screening for malignant neoplasm of prostate: Secondary | ICD-10-CM | POA: Diagnosis not present

## 2015-10-03 DIAGNOSIS — E7800 Pure hypercholesterolemia, unspecified: Secondary | ICD-10-CM

## 2015-10-03 DIAGNOSIS — I10 Essential (primary) hypertension: Secondary | ICD-10-CM | POA: Diagnosis not present

## 2015-10-03 LAB — HEPATIC FUNCTION PANEL
ALBUMIN: 4.2 g/dL (ref 3.5–5.2)
ALT: 30 U/L (ref 0–53)
AST: 21 U/L (ref 0–37)
Alkaline Phosphatase: 46 U/L (ref 39–117)
Bilirubin, Direct: 0.1 mg/dL (ref 0.0–0.3)
TOTAL PROTEIN: 6.9 g/dL (ref 6.0–8.3)
Total Bilirubin: 0.8 mg/dL (ref 0.2–1.2)

## 2015-10-03 LAB — CBC WITH DIFFERENTIAL/PLATELET
BASOS ABS: 0 10*3/uL (ref 0.0–0.1)
Basophils Relative: 0.5 % (ref 0.0–3.0)
Eosinophils Absolute: 0.1 10*3/uL (ref 0.0–0.7)
Eosinophils Relative: 2.1 % (ref 0.0–5.0)
HEMATOCRIT: 45 % (ref 39.0–52.0)
Hemoglobin: 15.4 g/dL (ref 13.0–17.0)
LYMPHS PCT: 29.5 % (ref 12.0–46.0)
Lymphs Abs: 1.6 10*3/uL (ref 0.7–4.0)
MCHC: 34.4 g/dL (ref 30.0–36.0)
MCV: 89 fl (ref 78.0–100.0)
MONOS PCT: 7.2 % (ref 3.0–12.0)
Monocytes Absolute: 0.4 10*3/uL (ref 0.1–1.0)
NEUTROS ABS: 3.2 10*3/uL (ref 1.4–7.7)
Neutrophils Relative %: 60.7 % (ref 43.0–77.0)
Platelets: 174 10*3/uL (ref 150.0–400.0)
RBC: 5.05 Mil/uL (ref 4.22–5.81)
RDW: 12.7 % (ref 11.5–15.5)
WBC: 5.3 10*3/uL (ref 4.0–10.5)

## 2015-10-03 LAB — BASIC METABOLIC PANEL
BUN: 18 mg/dL (ref 6–23)
CO2: 30 meq/L (ref 19–32)
Calcium: 9.4 mg/dL (ref 8.4–10.5)
Chloride: 104 mEq/L (ref 96–112)
Creatinine, Ser: 1.19 mg/dL (ref 0.40–1.50)
GFR: 67.38 mL/min (ref >60.00)
GLUCOSE: 94 mg/dL (ref 70–99)
POTASSIUM: 4.3 meq/L (ref 3.5–5.1)
SODIUM: 139 meq/L (ref 135–145)

## 2015-10-03 LAB — LIPID PANEL
CHOL/HDL RATIO: 4
Cholesterol: 162 mg/dL (ref 0–200)
HDL: 41.8 mg/dL (ref >39.00)
LDL CALC: 105 mg/dL — AB (ref 0–99)
NonHDL: 120.27
TRIGLYCERIDES: 77 mg/dL (ref 0.0–149.0)
VLDL: 15.4 mg/dL (ref 0.0–40.0)

## 2015-10-03 LAB — PSA: PSA: 0.53 ng/mL (ref 0.10–4.00)

## 2015-10-03 LAB — TSH: TSH: 0.93 u[IU]/mL (ref 0.35–4.50)

## 2015-10-09 NOTE — Telephone Encounter (Signed)
Unread mychart message mailed to patient 

## 2016-01-25 ENCOUNTER — Ambulatory Visit (INDEPENDENT_AMBULATORY_CARE_PROVIDER_SITE_OTHER): Payer: Managed Care, Other (non HMO) | Admitting: Internal Medicine

## 2016-01-25 ENCOUNTER — Ambulatory Visit: Payer: Managed Care, Other (non HMO) | Admitting: Internal Medicine

## 2016-01-25 ENCOUNTER — Encounter: Payer: Self-pay | Admitting: Internal Medicine

## 2016-01-25 DIAGNOSIS — M546 Pain in thoracic spine: Secondary | ICD-10-CM | POA: Diagnosis not present

## 2016-01-25 DIAGNOSIS — E7800 Pure hypercholesterolemia, unspecified: Secondary | ICD-10-CM | POA: Diagnosis not present

## 2016-01-25 DIAGNOSIS — I10 Essential (primary) hypertension: Secondary | ICD-10-CM

## 2016-01-25 DIAGNOSIS — F439 Reaction to severe stress, unspecified: Secondary | ICD-10-CM

## 2016-01-25 DIAGNOSIS — Q6100 Congenital renal cyst, unspecified: Secondary | ICD-10-CM

## 2016-01-25 DIAGNOSIS — N281 Cyst of kidney, acquired: Secondary | ICD-10-CM

## 2016-01-25 DIAGNOSIS — Z658 Other specified problems related to psychosocial circumstances: Secondary | ICD-10-CM

## 2016-01-25 NOTE — Progress Notes (Signed)
Patient ID: Rodney Benjamin, male   DOB: 07-17-1960, 55 y.o.   MRN: [id number]   Subjective:    Patient ID: Rodney Benjamin, male    DOB: 12-11-1960, 55 y.o.   MRN: [id number]  HPI  Patient here for a scheduled follow up.  He is currently preparing to donate his kidney to his son.  His son is ready for his kidney transplant.  Increased stress with this, but overall handling things relatively well.  Trying to stay active.  Just had MRI kidney.   No chest pain.  No sob.  Having some mid back pain and pain that radiates around right lateral side.  He has started going to the gym and exercising.  Pain is better.  No nausea or vomiting.  Bowels stable.     Past Medical History:  Diagnosis Date  . History of chicken pox   . Hypercholesterolemia   . Hypertension    Past Surgical History:  Procedure Laterality Date  . INNER EAR SURGERY     cholesteatoma removed x 2  . KNEE SURGERY  1982   right  . TONSILLECTOMY     Family History  Problem Relation Age of Onset  . Prostate cancer    . Hypertension    . Breast cancer     Social History   Social History  . Marital status: Married    Spouse name: N/A  . Number of children: N/A  . Years of education: N/A   Social History Main Topics  . Smoking status: Never Smoker  . Smokeless tobacco: Never Used  . Alcohol use No  . Drug use: No  . Sexual activity: Not Asked   Other Topics Concern  . None   Social History Narrative  . None    Outpatient Encounter Prescriptions as of 01/25/2016  Medication Sig  . lisinopril (PRINIVIL,ZESTRIL) 10 MG tablet TAKE ONE TABLET BY MOUTH EVERY DAY   No facility-administered encounter medications on file as of 01/25/2016.     Review of Systems  Constitutional: Negative for appetite change and unexpected weight change.  HENT: Negative for congestion and sinus pressure.   Respiratory: Negative for cough, chest tightness and shortness of breath.   Cardiovascular: Negative for chest pain,  palpitations and leg swelling.  Gastrointestinal: Negative for abdominal pain, diarrhea, nausea and vomiting.  Genitourinary: Negative for difficulty urinating and dysuria.  Musculoskeletal: Positive for back pain. Negative for myalgias.  Skin: Negative for color change and rash.  Neurological: Negative for dizziness, light-headedness and headaches.  Psychiatric/Behavioral: Negative for agitation and dysphoric mood.       Objective:    Physical Exam  Constitutional: He appears well-developed and well-nourished. No distress.  HENT:  Nose: Nose normal.  Mouth/Throat: Oropharynx is clear and moist.  Neck: Neck supple. No thyromegaly present.  Cardiovascular: Normal rate and regular rhythm.   Pulmonary/Chest: Effort normal and breath sounds normal. No respiratory distress.  Abdominal: Soft. Bowel sounds are normal. There is no tenderness.  Musculoskeletal: He exhibits no edema or tenderness.  Lymphadenopathy:    He has no cervical adenopathy.  Skin: Rash noted. No erythema.  Psychiatric: He has a normal mood and affect. His behavior is normal.    BP 114/78   Pulse 80   Temp 98.2 F (36.8 C)   Resp 14   Wt 226 lb (102.5 kg)   BMI 30.65 kg/m  Wt Readings from Last 3 Encounters:  01/25/16 226 lb (102.5 kg)  09/24/15 218 lb 4 oz (  99 kg)  01/30/15 210 lb 9.6 oz (95.5 kg)     Lab Results  Component Value Date   WBC 5.3 10/03/2015   HGB 15.4 10/03/2015   HCT 45.0 10/03/2015   PLT 174.0 10/03/2015   GLUCOSE 94 10/03/2015   CHOL 162 10/03/2015   TRIG 77.0 10/03/2015   HDL 41.80 10/03/2015   LDLCALC 105 (H) 10/03/2015   ALT 30 10/03/2015   AST 21 10/03/2015   NA 139 10/03/2015   K 4.3 10/03/2015   CL 104 10/03/2015   CREATININE 1.19 10/03/2015   BUN 18 10/03/2015   CO2 30 10/03/2015   TSH 0.93 10/03/2015   PSA 0.53 10/03/2015       Assessment & Plan:   Problem List Items Addressed This Visit    Back pain    Back, side and anterior chest discomfort.  Better  since exercising.  Follow.        Hypercholesterolemia    Low cholesterol diet and exercise.  Follow lipid panel.  Hold on checking labs, since w/up at medical center in preparation for transplant.        Hypertension    Blood pressure under good control.  Continue same medication regimen.  Follow pressures.  Follow metabolic panel.        Renal cyst    Previously evaluated by Dr Achilles Dunk.  Felt things were stable.  Just had f/u MRI.        Stress    Increased stress as outlined.  Feels he is handling things well.  Follow.         Other Visit Diagnoses   None.      Dale Vinton, MD

## 2016-01-25 NOTE — Progress Notes (Signed)
Pre visit review using our clinic review tool, if applicable. No additional management support is needed unless otherwise documented below in the visit note. 

## 2016-01-26 ENCOUNTER — Encounter: Payer: Self-pay | Admitting: Internal Medicine

## 2016-01-26 DIAGNOSIS — F439 Reaction to severe stress, unspecified: Secondary | ICD-10-CM | POA: Insufficient documentation

## 2016-01-26 NOTE — Assessment & Plan Note (Signed)
Back, side and anterior chest discomfort.  Better since exercising.  Follow.

## 2016-01-26 NOTE — Assessment & Plan Note (Signed)
Low cholesterol diet and exercise.  Follow lipid panel.  Hold on checking labs, since w/up at medical center in preparation for transplant.

## 2016-01-26 NOTE — Assessment & Plan Note (Signed)
Previously evaluated by Dr Achilles Dunk.  Felt things were stable.  Just had f/u MRI.

## 2016-01-26 NOTE — Assessment & Plan Note (Signed)
Increased stress as outlined.  Feels he is handling things well.  Follow.

## 2016-01-26 NOTE — Assessment & Plan Note (Signed)
Blood pressure under good control.  Continue same medication regimen.  Follow pressures.  Follow metabolic panel.   

## 2016-03-24 ENCOUNTER — Encounter: Payer: Self-pay | Admitting: Internal Medicine

## 2016-05-13 ENCOUNTER — Ambulatory Visit (INDEPENDENT_AMBULATORY_CARE_PROVIDER_SITE_OTHER): Payer: Managed Care, Other (non HMO)

## 2016-05-13 DIAGNOSIS — Z23 Encounter for immunization: Secondary | ICD-10-CM

## 2016-05-27 ENCOUNTER — Encounter: Payer: Self-pay | Admitting: Internal Medicine

## 2016-05-27 ENCOUNTER — Ambulatory Visit (INDEPENDENT_AMBULATORY_CARE_PROVIDER_SITE_OTHER): Payer: Managed Care, Other (non HMO) | Admitting: Internal Medicine

## 2016-05-27 DIAGNOSIS — F439 Reaction to severe stress, unspecified: Secondary | ICD-10-CM | POA: Diagnosis not present

## 2016-05-27 DIAGNOSIS — I10 Essential (primary) hypertension: Secondary | ICD-10-CM | POA: Diagnosis not present

## 2016-05-27 DIAGNOSIS — R634 Abnormal weight loss: Secondary | ICD-10-CM

## 2016-05-27 DIAGNOSIS — E7800 Pure hypercholesterolemia, unspecified: Secondary | ICD-10-CM

## 2016-05-27 NOTE — Progress Notes (Signed)
Pre visit review using our clinic review tool, if applicable. No additional management support is needed unless otherwise documented below in the visit note. 

## 2016-05-27 NOTE — Progress Notes (Signed)
Patient ID: Rodney Benjamin, male   DOB: 08/30/1960, 55 y.o.   MRN: [id number]   Subjective:    Patient ID: Rodney DeitersDarryl D Benjamin, male    DOB: 08/30/1960, 55 y.o.   MRN: [id number]  HPI  Patient here for a scheduled follow up.  He has been under increased stress recently.  His son just had a kidney transplant.  He was the doner.  He is recovering well.  His son's creatinine is still elevated and he has had issues with the procedure.  He had good support.  He does not feel he needs anything more at this time.  His blood pressure has been elevated.  States recently averaging 130s/low 90s.  Has a call in to the transplant coordinator.  No headache.  No chest pain. No sob.  No acid reflux.  No abdominal pain or cramping.  Bowels stable.     Past Medical History:  Diagnosis Date  . History of chicken pox   . Hypercholesterolemia   . Hypertension    Past Surgical History:  Procedure Laterality Date  . INNER EAR SURGERY     cholesteatoma removed x 2  . KNEE SURGERY  1982   right  . TONSILLECTOMY     Family History  Problem Relation Age of Onset  . Prostate cancer    . Hypertension    . Breast cancer     Social History   Social History  . Marital status: Married    Spouse name: N/A  . Number of children: N/A  . Years of education: N/A   Social History Main Topics  . Smoking status: Never Smoker  . Smokeless tobacco: Never Used  . Alcohol use No  . Drug use: No  . Sexual activity: Not Asked   Other Topics Concern  . None   Social History Narrative  . None    Outpatient Encounter Prescriptions as of 05/27/2016  Medication Sig  . [DISCONTINUED] lisinopril (PRINIVIL,ZESTRIL) 10 MG tablet TAKE ONE TABLET BY MOUTH EVERY DAY (Patient not taking: Reported on 05/27/2016)   No facility-administered encounter medications on file as of 05/27/2016.     Review of Systems  Constitutional: Negative for appetite change and unexpected weight change.  HENT: Negative for congestion  and sinus pressure.   Respiratory: Negative for cough, chest tightness and shortness of breath.   Cardiovascular: Negative for chest pain, palpitations and leg swelling.  Gastrointestinal: Negative for abdominal pain, diarrhea, nausea and vomiting.  Genitourinary: Negative for difficulty urinating and dysuria.  Musculoskeletal: Negative for back pain and joint swelling.  Skin: Negative for color change and rash.  Neurological: Negative for dizziness, light-headedness and headaches.  Psychiatric/Behavioral: Negative for agitation and dysphoric mood.       Objective:     Blood pressure rechecked by me:  142/94  Physical Exam  Constitutional: He appears well-developed and well-nourished. No distress.  HENT:  Nose: Nose normal.  Mouth/Throat: Oropharynx is clear and moist.  Neck: Neck supple. No thyromegaly present.  Cardiovascular: Normal rate and regular rhythm.   Pulmonary/Chest: Effort normal and breath sounds normal. No respiratory distress.  Abdominal: Soft. Bowel sounds are normal. There is no tenderness.  Musculoskeletal: He exhibits no edema or tenderness.  Lymphadenopathy:    He has no cervical adenopathy.  Skin: Skin is warm and dry. No erythema.  Psychiatric: He has a normal mood and affect. His behavior is normal.    BP (!) 162/98   Pulse 99   Temp 98.4 F (36.9  C) (Oral)   Ht 6' (1.829 m)   Wt 216 lb 12.8 oz (98.3 kg)   SpO2 97%   BMI 29.40 kg/m  Wt Readings from Last 3 Encounters:  05/27/16 216 lb 12.8 oz (98.3 kg)  01/25/16 226 lb (102.5 kg)  09/24/15 218 lb 4 oz (99 kg)     Lab Results  Component Value Date   WBC 5.3 10/03/2015   HGB 15.4 10/03/2015   HCT 45.0 10/03/2015   PLT 174.0 10/03/2015   GLUCOSE 94 10/03/2015   CHOL 162 10/03/2015   TRIG 77.0 10/03/2015   HDL 41.80 10/03/2015   LDLCALC 105 (H) 10/03/2015   ALT 30 10/03/2015   AST 21 10/03/2015   NA 139 10/03/2015   K 4.3 10/03/2015   CL 104 10/03/2015   CREATININE 1.19 10/03/2015    BUN 18 10/03/2015   CO2 30 10/03/2015   TSH 0.93 10/03/2015   PSA 0.53 10/03/2015       Assessment & Plan:   Problem List Items Addressed This Visit    Hypercholesterolemia    Low cholesterol diet and exercise.  Follow lipid panel.        Hypertension    Blood pressure elevated as outlined.  Has a call in to his transplant coordinator.  Will need medication.  Follow.        Loss of weight    Has lost weight.  States is eating.  Follow.        Stress    Increased stress as outlined.  Feels he is handling things relatively well.  Follow.  Desires no further intervention.            Dale DurhamSCOTT, Laurren Lepkowski, MD

## 2016-05-28 ENCOUNTER — Telehealth: Payer: Self-pay | Admitting: Internal Medicine

## 2016-05-28 ENCOUNTER — Telehealth: Payer: Self-pay | Admitting: *Deleted

## 2016-05-28 NOTE — Telephone Encounter (Signed)
[facility] transplant requested a call from Dr. Patria ManeScott  Contact direct line [phone]

## 2016-05-28 NOTE — Telephone Encounter (Signed)
My chart message sent to pt for update.   

## 2016-05-29 NOTE — Telephone Encounter (Signed)
Please advise 

## 2016-05-30 ENCOUNTER — Other Ambulatory Visit: Payer: Self-pay | Admitting: Internal Medicine

## 2016-05-30 MED ORDER — AMLODIPINE BESYLATE 5 MG PO TABS: 5.0000 mg | ORAL_TABLET | Freq: Every day | ORAL | 1 refills | Status: DC

## 2016-05-30 NOTE — Telephone Encounter (Signed)
See phone message.  Will start amlodipine.  rx sent in. Discussed with Amy Woodard.

## 2016-05-30 NOTE — Progress Notes (Signed)
rx sent in for amlodipine 5mg #30 with one refill.   

## 2016-05-30 NOTE — Telephone Encounter (Signed)
Please call and notify pt that [facility]  Told her that I would like to start him on amlodipine.  Will send in rx.  He is to take one per day and spot check blood pressure.  Notify me if any problems and send in readings in the next several weeks.

## 2016-05-30 NOTE — Telephone Encounter (Signed)
Left message to notify

## 2016-06-01 ENCOUNTER — Encounter: Payer: Self-pay | Admitting: Internal Medicine

## 2016-06-01 NOTE — Assessment & Plan Note (Signed)
Has lost weight.  States is eating.  Follow.   

## 2016-06-01 NOTE — Assessment & Plan Note (Signed)
Increased stress as outlined.  Feels he is handling things relatively well.  Follow.  Desires no further intervention.

## 2016-06-01 NOTE — Assessment & Plan Note (Signed)
Low cholesterol diet and exercise.  Follow lipid panel.   

## 2016-06-01 NOTE — Assessment & Plan Note (Signed)
Blood pressure elevated as outlined.  Has a call in to his transplant coordinator.  Will need medication.  Follow.

## 2016-06-13 ENCOUNTER — Encounter: Payer: Self-pay | Admitting: Internal Medicine

## 2016-06-17 ENCOUNTER — Other Ambulatory Visit: Payer: Self-pay | Admitting: Internal Medicine

## 2016-06-17 MED ORDER — AMLODIPINE BESYLATE 10 MG PO TABS: 10.0000 mg | ORAL_TABLET | Freq: Every day | ORAL | 2 refills | Status: DC

## 2016-06-17 NOTE — Progress Notes (Signed)
Sent in rx for amlodipine 10mg  q day #30 with 2 refills.

## 2016-06-20 ENCOUNTER — Telehealth: Payer: Self-pay | Admitting: Surgical

## 2016-06-20 NOTE — Telephone Encounter (Signed)
Spoke with patient about My chart message and he has started taking the new dose of amlodipine.

## 2016-07-04 ENCOUNTER — Encounter: Payer: Self-pay | Admitting: Internal Medicine

## 2016-08-16 ENCOUNTER — Other Ambulatory Visit: Payer: Self-pay | Admitting: Internal Medicine

## 2016-09-29 ENCOUNTER — Ambulatory Visit (INDEPENDENT_AMBULATORY_CARE_PROVIDER_SITE_OTHER): Payer: Managed Care, Other (non HMO) | Admitting: Internal Medicine

## 2016-09-29 ENCOUNTER — Encounter: Payer: Self-pay | Admitting: Internal Medicine

## 2016-09-29 VITALS — BP 130/88 | HR 80 | Temp 98.6°F | Resp 12 | Ht 72.0 in | Wt 222.2 lb

## 2016-09-29 DIAGNOSIS — N183 Chronic kidney disease, stage 3 unspecified: Secondary | ICD-10-CM

## 2016-09-29 DIAGNOSIS — Z0000 Encounter for general adult medical examination without abnormal findings: Secondary | ICD-10-CM | POA: Diagnosis not present

## 2016-09-29 DIAGNOSIS — E7800 Pure hypercholesterolemia, unspecified: Secondary | ICD-10-CM | POA: Diagnosis not present

## 2016-09-29 DIAGNOSIS — M546 Pain in thoracic spine: Secondary | ICD-10-CM | POA: Diagnosis not present

## 2016-09-29 DIAGNOSIS — F439 Reaction to severe stress, unspecified: Secondary | ICD-10-CM

## 2016-09-29 DIAGNOSIS — I10 Essential (primary) hypertension: Secondary | ICD-10-CM | POA: Diagnosis not present

## 2016-09-29 NOTE — Progress Notes (Signed)
Pre-visit discussion using our clinic review tool. No additional management support is needed unless otherwise documented below in the visit note.  

## 2016-09-29 NOTE — Assessment & Plan Note (Addendum)
Physical today 09/29/16.  Colonoscopy due 08/2020.  Check psa.

## 2016-09-29 NOTE — Progress Notes (Signed)
Patient ID: Rodney Benjamin, male   DOB: April 14, 1961, 56 y.o.   MRN: [id number]   Subjective:    Patient ID: Rodney Benjamin, male    DOB: 02/05/1961, 56 y.o.   MRN: [id number]  HPI  Patient here for his physical exam.  States he is doing relatively well.  Increased stress with his son's health issues.  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  His son's renal function has not responded as they had hoped.  Mr Sannes's creatinine has increased some as well.  He overall feels he is handling things relatively well.  No chest pain.  No sob.  No acid reflux.  No abdominal pain.  Bowels moving.  Tolerating norvasc.     Past Medical History:  Diagnosis Date  . History of chicken pox   . Hypercholesterolemia   . Hypertension    Past Surgical History:  Procedure Laterality Date  . INNER EAR SURGERY     cholesteatoma removed x 2  . KNEE SURGERY  1982   right  . TONSILLECTOMY     Family History  Problem Relation Age of Onset  . Prostate cancer    . Hypertension    . Breast cancer     Social History   Social History  . Marital status: Married    Spouse name: N/A  . Number of children: N/A  . Years of education: N/A   Social History Main Topics  . Smoking status: Never Smoker  . Smokeless tobacco: Never Used  . Alcohol use No  . Drug use: No  . Sexual activity: Not Asked   Other Topics Concern  . None   Social History Narrative  . None    Outpatient Encounter Prescriptions as of 09/29/2016  Medication Sig  . amLODipine (NORVASC) 10 MG tablet TAKE ONE TABLET BY MOUTH EVERY DAY   No facility-administered encounter medications on file as of 09/29/2016.     Review of Systems  Constitutional: Negative for appetite change and unexpected weight change.  HENT: Negative for congestion and sinus pressure.   Eyes: Negative for pain and visual disturbance.  Respiratory: Negative for cough, chest tightness and shortness of breath.   Cardiovascular: Negative for  chest pain, palpitations and leg swelling.  Gastrointestinal: Negative for abdominal pain, diarrhea, nausea and vomiting.  Genitourinary: Negative for difficulty urinating and dysuria.  Musculoskeletal: Negative for back pain and joint swelling.  Skin: Negative for color change and rash.  Neurological: Negative for dizziness, light-headedness and headaches.  Hematological: Negative for adenopathy. Does not bruise/bleed easily.  Psychiatric/Behavioral: Negative for agitation and dysphoric mood.       Objective:     Blood pressure rechecked by me:  134/84  Physical Exam  Constitutional: He is oriented to person, place, and time. He appears well-developed and well-nourished. No distress.  HENT:  Head: Normocephalic and atraumatic.  Nose: Nose normal.  Mouth/Throat: Oropharynx is clear and moist. No oropharyngeal exudate.  Eyes: Conjunctivae are normal. Right eye exhibits no discharge. Left eye exhibits no discharge.  Neck: Neck supple. No thyromegaly present.  Cardiovascular: Normal rate and regular rhythm.   Pulmonary/Chest: Breath sounds normal. No respiratory distress. He has no wheezes.  Abdominal: Soft. Bowel sounds are normal. There is no tenderness.  Musculoskeletal: He exhibits no edema or tenderness.  Lymphadenopathy:    He has no cervical adenopathy.  Neurological: He is alert and oriented to person, place, and time.  Skin: Skin is warm and dry. No rash noted.  Psychiatric: He has a normal mood and affect. His behavior is normal.    BP 130/88 (BP Location: Left Arm, Patient Position: Sitting, Cuff Size: Normal)   Pulse 80   Temp 98.6 F (37 C) (Oral)   Resp 12   Ht 6' (1.829 m)   Wt 222 lb 3.2 oz (100.8 kg)   SpO2 97%   BMI 30.14 kg/m  Wt Readings from Last 3 Encounters:  09/29/16 222 lb 3.2 oz (100.8 kg)  05/27/16 216 lb 12.8 oz (98.3 kg)  01/25/16 226 lb (102.5 kg)     Lab Results  Component Value Date   WBC 5.2 10/06/2016   HGB 14.3 10/06/2016   HCT  42.5 10/06/2016   PLT 172.0 10/06/2016   GLUCOSE 96 10/06/2016   CHOL 156 10/06/2016   TRIG 78.0 10/06/2016   HDL 37.60 (L) 10/06/2016   LDLCALC 102 (H) 10/06/2016   ALT 22 10/06/2016   AST 17 10/06/2016   NA 139 10/06/2016   K 4.2 10/06/2016   CL 106 10/06/2016   CREATININE 1.69 (H) 10/06/2016   BUN 19 10/06/2016   CO2 29 10/06/2016   TSH 1.24 10/06/2016   PSA 1.19 10/06/2016        Assessment & Plan:   Problem List Items Addressed This Visit    Back pain    Stable.       CKD (chronic kidney disease), stage III    Avoid antiinflammatories.  Recheck metabolic panel.  Continue f/u with nephrology.       Health care maintenance    Physical today 09/29/16.  Colonoscopy due 08/2020.  Check psa.       Hypercholesterolemia    Low cholesterol diet and exercise.  Follow lipid panel.        Hypertension    Blood pressure under reasonable control.  Continue same medication regimen.  Follow pressures.  Follow metabolic panel.        Stress    Increased stress.  Discussed with him today.  Overall handling things relatively well.  Does not feel needs anything more at this time.  Follow.            Dale [place], MD

## 2016-10-03 ENCOUNTER — Telehealth: Payer: Self-pay | Admitting: Radiology

## 2016-10-03 ENCOUNTER — Other Ambulatory Visit: Payer: Self-pay | Admitting: Internal Medicine

## 2016-10-03 DIAGNOSIS — E7800 Pure hypercholesterolemia, unspecified: Secondary | ICD-10-CM

## 2016-10-03 DIAGNOSIS — F439 Reaction to severe stress, unspecified: Secondary | ICD-10-CM

## 2016-10-03 DIAGNOSIS — Z125 Encounter for screening for malignant neoplasm of prostate: Secondary | ICD-10-CM

## 2016-10-03 DIAGNOSIS — I10 Essential (primary) hypertension: Secondary | ICD-10-CM

## 2016-10-03 NOTE — Telephone Encounter (Signed)
Pt coming in for labs Monday, please place future orders. Thank you 

## 2016-10-03 NOTE — Progress Notes (Signed)
Orders placed for labs

## 2016-10-03 NOTE — Telephone Encounter (Signed)
Orders placed for labs

## 2016-10-06 ENCOUNTER — Other Ambulatory Visit (INDEPENDENT_AMBULATORY_CARE_PROVIDER_SITE_OTHER): Payer: Managed Care, Other (non HMO)

## 2016-10-06 DIAGNOSIS — Z125 Encounter for screening for malignant neoplasm of prostate: Secondary | ICD-10-CM

## 2016-10-06 DIAGNOSIS — E7800 Pure hypercholesterolemia, unspecified: Secondary | ICD-10-CM

## 2016-10-06 DIAGNOSIS — I10 Essential (primary) hypertension: Secondary | ICD-10-CM | POA: Diagnosis not present

## 2016-10-06 LAB — CBC WITH DIFFERENTIAL/PLATELET
BASOS ABS: 0 10*3/uL (ref 0.0–0.1)
Basophils Relative: 0.5 % (ref 0.0–3.0)
EOS ABS: 0.2 10*3/uL (ref 0.0–0.7)
Eosinophils Relative: 2.9 % (ref 0.0–5.0)
HEMATOCRIT: 42.5 % (ref 39.0–52.0)
Hemoglobin: 14.3 g/dL (ref 13.0–17.0)
LYMPHS ABS: 1.6 10*3/uL (ref 0.7–4.0)
Lymphocytes Relative: 30.4 % (ref 12.0–46.0)
MCHC: 33.6 g/dL (ref 30.0–36.0)
MCV: 89.8 fl (ref 78.0–100.0)
MONO ABS: 0.4 10*3/uL (ref 0.1–1.0)
Monocytes Relative: 8.4 % (ref 3.0–12.0)
NEUTROS ABS: 3 10*3/uL (ref 1.4–7.7)
Neutrophils Relative %: 57.8 % (ref 43.0–77.0)
Platelets: 172 10*3/uL (ref 150.0–400.0)
RBC: 4.74 Mil/uL (ref 4.22–5.81)
RDW: 12.6 % (ref 11.5–15.5)
WBC: 5.2 10*3/uL (ref 4.0–10.5)

## 2016-10-06 LAB — HEPATIC FUNCTION PANEL
ALBUMIN: 4.1 g/dL (ref 3.5–5.2)
ALK PHOS: 43 U/L (ref 39–117)
ALT: 22 U/L (ref 0–53)
AST: 17 U/L (ref 0–37)
Bilirubin, Direct: 0.1 mg/dL (ref 0.0–0.3)
TOTAL PROTEIN: 6.8 g/dL (ref 6.0–8.3)
Total Bilirubin: 0.7 mg/dL (ref 0.2–1.2)

## 2016-10-06 LAB — BASIC METABOLIC PANEL
BUN: 19 mg/dL (ref 6–23)
CO2: 29 mEq/L (ref 19–32)
Calcium: 9.2 mg/dL (ref 8.4–10.5)
Chloride: 106 mEq/L (ref 96–112)
Creatinine, Ser: 1.69 mg/dL — ABNORMAL HIGH (ref 0.40–1.50)
GFR: 44.78 mL/min — AB (ref >60.00)
GLUCOSE: 96 mg/dL (ref 70–99)
POTASSIUM: 4.2 meq/L (ref 3.5–5.1)
SODIUM: 139 meq/L (ref 135–145)

## 2016-10-06 LAB — LIPID PANEL
Cholesterol: 156 mg/dL (ref 0–200)
HDL: 37.6 mg/dL — AB (ref >39.00)
LDL Cholesterol: 102 mg/dL — ABNORMAL HIGH (ref 0–99)
NonHDL: 117.97
TRIGLYCERIDES: 78 mg/dL (ref 0.0–149.0)
Total CHOL/HDL Ratio: 4
VLDL: 15.6 mg/dL (ref 0.0–40.0)

## 2016-10-06 LAB — TSH: TSH: 1.24 u[IU]/mL (ref 0.35–4.50)

## 2016-10-06 LAB — PSA: PSA: 1.19 ng/mL (ref 0.10–4.00)

## 2016-10-12 ENCOUNTER — Encounter: Payer: Self-pay | Admitting: Internal Medicine

## 2016-10-12 DIAGNOSIS — N183 Chronic kidney disease, stage 3 unspecified: Secondary | ICD-10-CM | POA: Insufficient documentation

## 2016-10-12 NOTE — Assessment & Plan Note (Signed)
Avoid antiinflammatories.  Recheck metabolic panel.  Continue f/u with nephrology.

## 2016-10-12 NOTE — Assessment & Plan Note (Signed)
Increased stress.  Discussed with him today.  Overall handling things relatively well.  Does not feel needs anything more at this time.  Follow.

## 2016-10-12 NOTE — Assessment & Plan Note (Signed)
Stable

## 2016-10-12 NOTE — Assessment & Plan Note (Signed)
Blood pressure under reasonable control.  Continue same medication regimen.  Follow pressures.  Follow metabolic panel.   

## 2016-10-12 NOTE — Assessment & Plan Note (Signed)
Low cholesterol diet and exercise.  Follow lipid panel.   

## 2016-10-20 ENCOUNTER — Encounter: Payer: Self-pay | Admitting: Internal Medicine

## 2016-11-19 ENCOUNTER — Telehealth: Payer: Self-pay | Admitting: *Deleted

## 2016-11-19 NOTE — Telephone Encounter (Signed)
Please read pt last my chart message from 10/20/2016 Pt [facility]. Patient stated his last physical (CPE) office note and blood pressure is required to be sent over .  Pt contact [phone]   [facility], all information is listed in the MyChart message

## 2016-11-19 NOTE — Telephone Encounter (Signed)
Patient notified note faxed from last physical. Faxed CPE note to Doristine JohnsAmy Woodward BSN [facility].

## 2017-01-28 ENCOUNTER — Encounter: Payer: Self-pay | Admitting: Internal Medicine

## 2017-01-28 DIAGNOSIS — I10 Essential (primary) hypertension: Secondary | ICD-10-CM

## 2017-01-28 DIAGNOSIS — E7800 Pure hypercholesterolemia, unspecified: Secondary | ICD-10-CM

## 2017-01-28 NOTE — Telephone Encounter (Signed)
He can get his labs drawn here.  I would also like to check a cholesterol labs.  Please schedule a fasting lab appt 1-2 days prior to appt or he can do same day if his appt is early.

## 2017-01-28 NOTE — Telephone Encounter (Signed)
Order placed for labs.

## 2017-02-02 ENCOUNTER — Encounter: Payer: Self-pay | Admitting: Internal Medicine

## 2017-02-03 NOTE — Telephone Encounter (Signed)
See notes, Patients BP has been elevated, no other symptoms noted, per the note. Please advise, thanks

## 2017-02-09 ENCOUNTER — Other Ambulatory Visit (INDEPENDENT_AMBULATORY_CARE_PROVIDER_SITE_OTHER): Payer: Managed Care, Other (non HMO)

## 2017-02-09 DIAGNOSIS — E7800 Pure hypercholesterolemia, unspecified: Secondary | ICD-10-CM | POA: Diagnosis not present

## 2017-02-09 DIAGNOSIS — I10 Essential (primary) hypertension: Secondary | ICD-10-CM | POA: Diagnosis not present

## 2017-02-09 LAB — LIPID PANEL
CHOL/HDL RATIO: 4
CHOLESTEROL: 142 mg/dL (ref 0–200)
HDL: 35.9 mg/dL — ABNORMAL LOW (ref >39.00)
LDL CALC: 88 mg/dL (ref 0–99)
NONHDL: 105.67
Triglycerides: 87 mg/dL (ref 0.0–149.0)
VLDL: 17.4 mg/dL (ref 0.0–40.0)

## 2017-02-09 LAB — BASIC METABOLIC PANEL
BUN: 21 mg/dL (ref 6–23)
CHLORIDE: 106 meq/L (ref 96–112)
CO2: 28 mEq/L (ref 19–32)
Calcium: 9.2 mg/dL (ref 8.4–10.5)
Creatinine, Ser: 1.71 mg/dL — ABNORMAL HIGH (ref 0.40–1.50)
GFR: 44.12 mL/min — ABNORMAL LOW (ref >60.00)
Glucose, Bld: 98 mg/dL (ref 70–99)
POTASSIUM: 4.3 meq/L (ref 3.5–5.1)
Sodium: 139 mEq/L (ref 135–145)

## 2017-02-09 LAB — HEPATIC FUNCTION PANEL
ALT: 16 U/L (ref 0–53)
AST: 17 U/L (ref 0–37)
Albumin: 3.9 g/dL (ref 3.5–5.2)
Alkaline Phosphatase: 45 U/L (ref 39–117)
BILIRUBIN TOTAL: 0.7 mg/dL (ref 0.2–1.2)
Bilirubin, Direct: 0.1 mg/dL (ref 0.0–0.3)
Total Protein: 7.3 g/dL (ref 6.0–8.3)

## 2017-02-09 LAB — URINALYSIS, ROUTINE W REFLEX MICROSCOPIC
BILIRUBIN URINE: NEGATIVE
Hgb urine dipstick: NEGATIVE
Ketones, ur: NEGATIVE
Leukocytes, UA: NEGATIVE
NITRITE: NEGATIVE
PH: 7 (ref 5.0–8.0)
Specific Gravity, Urine: 1.01 (ref 1.000–1.030)
Total Protein, Urine: NEGATIVE
Urine Glucose: NEGATIVE
Urobilinogen, UA: 0.2 (ref 0.0–1.0)

## 2017-02-11 ENCOUNTER — Encounter: Payer: Self-pay | Admitting: Internal Medicine

## 2017-02-12 ENCOUNTER — Ambulatory Visit: Payer: Managed Care, Other (non HMO) | Admitting: Internal Medicine

## 2017-02-12 ENCOUNTER — Ambulatory Visit (INDEPENDENT_AMBULATORY_CARE_PROVIDER_SITE_OTHER): Payer: Managed Care, Other (non HMO) | Admitting: Internal Medicine

## 2017-02-12 ENCOUNTER — Encounter: Payer: Self-pay | Admitting: Internal Medicine

## 2017-02-12 DIAGNOSIS — N183 Chronic kidney disease, stage 3 unspecified: Secondary | ICD-10-CM

## 2017-02-12 DIAGNOSIS — I10 Essential (primary) hypertension: Secondary | ICD-10-CM

## 2017-02-12 DIAGNOSIS — E7800 Pure hypercholesterolemia, unspecified: Secondary | ICD-10-CM

## 2017-02-12 DIAGNOSIS — F439 Reaction to severe stress, unspecified: Secondary | ICD-10-CM | POA: Diagnosis not present

## 2017-02-12 NOTE — Progress Notes (Signed)
Patient ID: Rodney Benjamin, male   DOB: 12/05/1960, 56 y.o.   MRN: [id number]   Subjective:    Patient ID: Rodney Benjamin, male    DOB: 1961/03/12, 56 y.o.   MRN: [id number]  HPI  Patient here for a scheduled follow up.  He reports he is doing relatively well.  Increased stress with his son's medical issues.  He has also been concerned about his blood pressure readings.  See attached list.  Blood pressure averaging 130-140 systolic readings. No chest pain.  No sob.  No acid reflux.  No abdominal pain.  Bowels moving.  Overall he is doing relatively well.  Feels he is handling stress relatively well.     Past Medical History:  Diagnosis Date  . History of chicken pox   . Hypercholesterolemia   . Hypertension    Past Surgical History:  Procedure Laterality Date  . INNER EAR SURGERY     cholesteatoma removed x 2  . KNEE SURGERY  1982   right  . TONSILLECTOMY     Family History  Problem Relation Age of Onset  . Prostate cancer Unknown   . Hypertension Unknown   . Breast cancer Unknown    Social History   Social History  . Marital status: Married    Spouse name: N/A  . Number of children: N/A  . Years of education: N/A   Social History Main Topics  . Smoking status: Never Smoker  . Smokeless tobacco: Never Used  . Alcohol use No  . Drug use: No  . Sexual activity: Not Asked   Other Topics Concern  . None   Social History Narrative  . None    Outpatient Encounter Prescriptions as of 02/12/2017  Medication Sig  . amLODipine (NORVASC) 10 MG tablet TAKE ONE TABLET BY MOUTH EVERY DAY   No facility-administered encounter medications on file as of 02/12/2017.     Review of Systems  Constitutional: Negative for appetite change and unexpected weight change.  HENT: Negative for congestion and sinus pressure.   Respiratory: Negative for cough, chest tightness and shortness of breath.   Cardiovascular: Negative for chest pain, palpitations and leg swelling.    Gastrointestinal: Negative for abdominal pain, diarrhea, nausea and vomiting.  Genitourinary: Negative for difficulty urinating and dysuria.  Musculoskeletal: Negative for back pain and joint swelling.  Skin: Negative for color change and rash.  Neurological: Negative for dizziness, light-headedness and headaches.  Psychiatric/Behavioral: Negative for agitation and dysphoric mood.       Objective:    Physical Exam  Constitutional: He appears well-developed and well-nourished. No distress.  HENT:  Nose: Nose normal.  Mouth/Throat: Oropharynx is clear and moist.  Neck: Neck supple. No thyromegaly present.  Cardiovascular: Normal rate and regular rhythm.   Pulmonary/Chest: Effort normal and breath sounds normal. No respiratory distress.  Abdominal: Soft. Bowel sounds are normal. There is no tenderness.  Musculoskeletal: He exhibits no edema or tenderness.  Lymphadenopathy:    He has no cervical adenopathy.  Skin: No rash noted. No erythema.  Psychiatric: He has a normal mood and affect. His behavior is normal.    BP (!) 142/80 (BP Location: Left Arm, Patient Position: Sitting, Cuff Size: Normal)   Pulse 68   Temp 97.9 F (36.6 C) (Oral)   Resp 20   Wt 221 lb 8 oz (100.5 kg)   SpO2 98%   BMI 30.04 kg/m  Wt Readings from Last 3 Encounters:  02/12/17 221 lb 8 oz (100.5 kg)  09/29/16 222 lb 3.2 oz (100.8 kg)  05/27/16 216 lb 12.8 oz (98.3 kg)     Lab Results  Component Value Date   WBC 5.2 10/06/2016   HGB 14.3 10/06/2016   HCT 42.5 10/06/2016   PLT 172.0 10/06/2016   GLUCOSE 98 02/09/2017   CHOL 142 02/09/2017   TRIG 87.0 02/09/2017   HDL 35.90 (L) 02/09/2017   LDLCALC 88 02/09/2017   ALT 16 02/09/2017   AST 17 02/09/2017   NA 139 02/09/2017   K 4.3 02/09/2017   CL 106 02/09/2017   CREATININE 1.71 (H) 02/09/2017   BUN 21 02/09/2017   CO2 28 02/09/2017   TSH 1.24 10/06/2016   PSA 1.19 10/06/2016        Assessment & Plan:   Problem List Items Addressed  This Visit    CKD (chronic kidney disease), stage III    Avoid antiinflammatories.  Recent creatinine stable (1.-1.7).  Follow metabolic panel.  Discussed blood pressure with his nephrologist.        Hypercholesterolemia    Low cholesterol diet and exercise.  Follow lipid panel.   Lab Results  Component Value Date   CHOL 142 02/09/2017   HDL 35.90 (L) 02/09/2017   LDLCALC 88 02/09/2017   TRIG 87.0 02/09/2017   CHOLHDL 4 02/09/2017        Hypertension    Blood pressure averaging 130-140 systolic.  Recheck today 130s systolic.  Will discuss with his nephrologist.  Follow pressures.  Follow renal function.        Stress    Increased stress.  Discussed with him today.  Overall he feels he is handling things relatively well.  Follow.            Dale Vardaman, MD

## 2017-02-12 NOTE — Progress Notes (Signed)
Pre visit review using our clinic review tool, if applicable. No additional management support is needed unless otherwise documented below in the visit note. 

## 2017-02-14 ENCOUNTER — Encounter: Payer: Self-pay | Admitting: Internal Medicine

## 2017-02-14 NOTE — Assessment & Plan Note (Signed)
Increased stress.  Discussed with him today.  Overall he feels he is handling things relatively well.  Follow.   

## 2017-02-14 NOTE — Assessment & Plan Note (Signed)
Blood pressure averaging 130-140 systolic.  Recheck today 130s systolic.  Will discuss with his nephrologist.  Follow pressures.  Follow renal function.

## 2017-02-14 NOTE — Assessment & Plan Note (Signed)
Low cholesterol diet and exercise.  Follow lipid panel.   Lab Results  Component Value Date   CHOL 142 02/09/2017   HDL 35.90 (L) 02/09/2017   LDLCALC 88 02/09/2017   TRIG 87.0 02/09/2017   CHOLHDL 4 02/09/2017

## 2017-02-14 NOTE — Assessment & Plan Note (Signed)
Avoid antiinflammatories.  Recent creatinine stable (1.-1.7).  Follow metabolic panel.  Discussed blood pressure with his nephrologist.

## 2017-02-16 ENCOUNTER — Other Ambulatory Visit: Payer: Self-pay | Admitting: Internal Medicine

## 2017-02-19 ENCOUNTER — Encounter: Payer: Self-pay | Admitting: Internal Medicine

## 2017-02-20 ENCOUNTER — Other Ambulatory Visit: Payer: Self-pay | Admitting: Internal Medicine

## 2017-02-20 DIAGNOSIS — I10 Essential (primary) hypertension: Secondary | ICD-10-CM

## 2017-02-20 DIAGNOSIS — N183 Chronic kidney disease, stage 3 unspecified: Secondary | ICD-10-CM

## 2017-02-20 MED ORDER — LISINOPRIL 10 MG PO TABS: 10.0000 mg | ORAL_TABLET | Freq: Every day | ORAL | 1 refills | Status: DC

## 2017-02-20 NOTE — Progress Notes (Signed)
Order placed for f/u met b and rx sent in for lisinopril #30 with one refill.

## 2017-02-26 ENCOUNTER — Encounter: Payer: Self-pay | Admitting: Internal Medicine

## 2017-02-26 NOTE — Telephone Encounter (Signed)
Faxed last OV notes and labs to the fax number provided.  I have give originals to CMA to keep if there are issues. thanks

## 2017-02-26 NOTE — Telephone Encounter (Signed)
Please send last office note and labs [facility] - see pts my chart.

## 2017-03-03 ENCOUNTER — Ambulatory Visit (INDEPENDENT_AMBULATORY_CARE_PROVIDER_SITE_OTHER): Payer: Managed Care, Other (non HMO) | Admitting: *Deleted

## 2017-03-03 ENCOUNTER — Other Ambulatory Visit (INDEPENDENT_AMBULATORY_CARE_PROVIDER_SITE_OTHER): Payer: Managed Care, Other (non HMO)

## 2017-03-03 ENCOUNTER — Other Ambulatory Visit: Payer: Managed Care, Other (non HMO)

## 2017-03-03 DIAGNOSIS — N183 Chronic kidney disease, stage 3 unspecified: Secondary | ICD-10-CM

## 2017-03-03 DIAGNOSIS — I10 Essential (primary) hypertension: Secondary | ICD-10-CM | POA: Diagnosis not present

## 2017-03-03 DIAGNOSIS — Z23 Encounter for immunization: Secondary | ICD-10-CM

## 2017-03-03 LAB — BASIC METABOLIC PANEL
BUN: 24 mg/dL — AB (ref 6–23)
CHLORIDE: 103 meq/L (ref 96–112)
CO2: 29 meq/L (ref 19–32)
CREATININE: 1.81 mg/dL — AB (ref 0.40–1.50)
Calcium: 9.6 mg/dL (ref 8.4–10.5)
GFR: 41.31 mL/min — ABNORMAL LOW (ref >60.00)
Glucose, Bld: 124 mg/dL — ABNORMAL HIGH (ref 70–99)
Potassium: 4.5 mEq/L (ref 3.5–5.1)
Sodium: 138 mEq/L (ref 135–145)

## 2017-03-05 ENCOUNTER — Encounter: Payer: Self-pay | Admitting: Internal Medicine

## 2017-03-12 ENCOUNTER — Telehealth: Payer: Self-pay | Admitting: Radiology

## 2017-03-12 NOTE — Telephone Encounter (Signed)
Pt coming in for labs next Friday, please place future orders. Thank you.  

## 2017-03-13 ENCOUNTER — Other Ambulatory Visit: Payer: Self-pay | Admitting: Internal Medicine

## 2017-03-13 DIAGNOSIS — N183 Chronic kidney disease, stage 3 unspecified: Secondary | ICD-10-CM

## 2017-03-13 NOTE — Progress Notes (Signed)
Order placed for f/u met b.  

## 2017-03-13 NOTE — Telephone Encounter (Signed)
Order placed for f/u met b.   

## 2017-03-17 ENCOUNTER — Other Ambulatory Visit: Payer: Self-pay | Admitting: Internal Medicine

## 2017-03-20 ENCOUNTER — Other Ambulatory Visit (INDEPENDENT_AMBULATORY_CARE_PROVIDER_SITE_OTHER): Payer: Managed Care, Other (non HMO)

## 2017-03-20 DIAGNOSIS — N183 Chronic kidney disease, stage 3 unspecified: Secondary | ICD-10-CM

## 2017-03-20 LAB — BASIC METABOLIC PANEL
BUN: 16 mg/dL (ref 6–23)
CALCIUM: 9.5 mg/dL (ref 8.4–10.5)
CO2: 26 mEq/L (ref 19–32)
Chloride: 103 mEq/L (ref 96–112)
Creatinine, Ser: 1.57 mg/dL — ABNORMAL HIGH (ref 0.40–1.50)
GFR: 48.68 mL/min — AB (ref >60.00)
Glucose, Bld: 132 mg/dL — ABNORMAL HIGH (ref 70–99)
Potassium: 3.8 mEq/L (ref 3.5–5.1)
SODIUM: 136 meq/L (ref 135–145)

## 2017-03-21 ENCOUNTER — Encounter: Payer: Self-pay | Admitting: Internal Medicine

## 2017-04-22 ENCOUNTER — Encounter: Payer: Self-pay | Admitting: Internal Medicine

## 2017-04-23 ENCOUNTER — Ambulatory Visit (INDEPENDENT_AMBULATORY_CARE_PROVIDER_SITE_OTHER): Payer: Managed Care, Other (non HMO) | Admitting: Internal Medicine

## 2017-04-23 ENCOUNTER — Encounter: Payer: Self-pay | Admitting: Internal Medicine

## 2017-04-23 VITALS — BP 136/74 | HR 90 | Temp 98.5°F | Resp 18 | Wt 215.6 lb

## 2017-04-23 DIAGNOSIS — R000 Tachycardia, unspecified: Secondary | ICD-10-CM | POA: Diagnosis not present

## 2017-04-23 DIAGNOSIS — N183 Chronic kidney disease, stage 3 unspecified: Secondary | ICD-10-CM

## 2017-04-23 DIAGNOSIS — E7800 Pure hypercholesterolemia, unspecified: Secondary | ICD-10-CM | POA: Diagnosis not present

## 2017-04-23 DIAGNOSIS — I10 Essential (primary) hypertension: Secondary | ICD-10-CM | POA: Diagnosis not present

## 2017-04-23 DIAGNOSIS — F439 Reaction to severe stress, unspecified: Secondary | ICD-10-CM

## 2017-04-23 DIAGNOSIS — R0683 Snoring: Secondary | ICD-10-CM | POA: Diagnosis not present

## 2017-04-23 DIAGNOSIS — K219 Gastro-esophageal reflux disease without esophagitis: Secondary | ICD-10-CM

## 2017-04-23 LAB — BASIC METABOLIC PANEL
BUN: 20 mg/dL (ref 6–23)
CHLORIDE: 103 meq/L (ref 96–112)
CO2: 30 mEq/L (ref 19–32)
Calcium: 9.6 mg/dL (ref 8.4–10.5)
Creatinine, Ser: 1.61 mg/dL — ABNORMAL HIGH (ref 0.40–1.50)
GFR: 47.27 mL/min — ABNORMAL LOW (ref >60.00)
Glucose, Bld: 107 mg/dL — ABNORMAL HIGH (ref 70–99)
POTASSIUM: 4.1 meq/L (ref 3.5–5.1)
Sodium: 138 mEq/L (ref 135–145)

## 2017-04-23 LAB — CBC WITH DIFFERENTIAL/PLATELET
BASOS ABS: 0 10*3/uL (ref 0.0–0.1)
Basophils Relative: 0.6 % (ref 0.0–3.0)
Eosinophils Absolute: 0.2 10*3/uL (ref 0.0–0.7)
Eosinophils Relative: 3.7 % (ref 0.0–5.0)
HCT: 43.5 % (ref 39.0–52.0)
Hemoglobin: 14.6 g/dL (ref 13.0–17.0)
LYMPHS ABS: 1.8 10*3/uL (ref 0.7–4.0)
LYMPHS PCT: 32 % (ref 12.0–46.0)
MCHC: 33.6 g/dL (ref 30.0–36.0)
MCV: 91.8 fl (ref 78.0–100.0)
MONOS PCT: 7.8 % (ref 3.0–12.0)
Monocytes Absolute: 0.4 10*3/uL (ref 0.1–1.0)
NEUTROS PCT: 55.9 % (ref 43.0–77.0)
Neutro Abs: 3.1 10*3/uL (ref 1.4–7.7)
Platelets: 175 10*3/uL (ref 150.0–400.0)
RBC: 4.74 Mil/uL (ref 4.22–5.81)
RDW: 12.7 % (ref 11.5–15.5)
WBC: 5.5 10*3/uL (ref 4.0–10.5)

## 2017-04-23 LAB — TSH: TSH: 0.97 u[IU]/mL (ref 0.35–4.50)

## 2017-04-23 NOTE — Patient Instructions (Signed)
Decrease amlodipine to 5mg  per day.  Take in the am.    Continue lisinopril in the evening.    Zantac (ranitidine) 150mg  - take 30 minutes before breakfast.

## 2017-04-23 NOTE — Progress Notes (Signed)
Patient ID: Rodney Benjamin, male   DOB: Jun 06, 1961, 56 y.o.   MRN: [id number]   Subjective:    Patient ID: Rodney Benjamin, male    DOB: Jun 06, 1961, 56 y.o.   MRN: [id number]  HPI  Patient here as a work in with concerns regarding feeling "foggy" at times.  On questioning him, the sensation is hard to describe.  States is not to the point of dizziness.  Maybe a light headed sensation.  No vision change.  No headache.  No other neurological symptoms.  Has noticed some acid reflux.  Take TUMS. Has noticed what feels like a lump in his upper chest.  No problems swallowing.  No abdominal pain.  Bowels moving.  No sob.  No chest pain.  Is exercising.  Will feel the sensation sometimes when he is exercising.  Also has noticed at night waking up with increased heart rate. Reports wife concerned he has sleep apnea.  Has had increased snoring.  Increased fatigue.  Blood pressure has been doing better.  He noticed the above sensation after starting lisinopril.     Past Medical History:  Diagnosis Date  . History of chicken pox   . Hypercholesterolemia   . Hypertension    Past Surgical History:  Procedure Laterality Date  . INNER EAR SURGERY     cholesteatoma removed x 2  . KNEE SURGERY  1982   right  . TONSILLECTOMY     Family History  Problem Relation Age of Onset  . Prostate cancer Unknown   . Hypertension Unknown   . Breast cancer Unknown    Social History   Social History  . Marital status: Married    Spouse name: N/A  . Number of children: N/A  . Years of education: N/A   Social History Main Topics  . Smoking status: Never Smoker  . Smokeless tobacco: Never Used  . Alcohol use No  . Drug use: No  . Sexual activity: Not Asked   Other Topics Concern  . None   Social History Narrative  . None    Outpatient Encounter Prescriptions as of 04/23/2017  Medication Sig  . amLODipine (NORVASC) 10 MG tablet TAKE ONE TABLET EVERY DAY  . lisinopril (PRINIVIL,ZESTRIL) 10 MG  tablet TAKE ONE TABLET BY MOUTH DAILY   No facility-administered encounter medications on file as of 04/23/2017.     Review of Systems  Constitutional: Negative for appetite change and unexpected weight change.  HENT: Negative for congestion and sinus pressure.   Respiratory: Negative for cough, chest tightness and shortness of breath.   Cardiovascular: Negative for chest pain and palpitations.  Gastrointestinal: Negative for abdominal pain, diarrhea, nausea and vomiting.       Positive acid reflux.    Genitourinary: Negative for difficulty urinating and dysuria.  Musculoskeletal: Negative for joint swelling and myalgias.  Skin: Negative for color change and rash.  Neurological: Positive for light-headedness. Negative for headaches.  Psychiatric/Behavioral: Negative for agitation and dysphoric mood.       Objective:     Blood pressure rechecked by me:  136/74  Physical Exam  Constitutional: He appears well-developed and well-nourished. No distress.  HENT:  Nose: Nose normal.  Mouth/Throat: Oropharynx is clear and moist.  Neck: Neck supple. No thyromegaly present.  Cardiovascular: Normal rate and regular rhythm.   Pulmonary/Chest: Effort normal and breath sounds normal. No respiratory distress.  Abdominal: Soft. Bowel sounds are normal. There is no tenderness.  Musculoskeletal: He exhibits no edema or tenderness.  Lymphadenopathy:    He has no cervical adenopathy.  Skin: No rash noted. No erythema.  Psychiatric: He has a normal mood and affect. His behavior is normal.    BP 136/74   Pulse 90   Temp 98.5 F (36.9 C) (Oral)   Resp 18   Wt 215 lb 9.6 oz (97.8 kg)   SpO2 97%   BMI 29.24 kg/m  Wt Readings from Last 3 Encounters:  04/23/17 215 lb 9.6 oz (97.8 kg)  02/12/17 221 lb 8 oz (100.5 kg)  09/29/16 222 lb 3.2 oz (100.8 kg)     Lab Results  Component Value Date   WBC 5.5 04/23/2017   HGB 14.6 04/23/2017   HCT 43.5 04/23/2017   PLT 175.0 04/23/2017   GLUCOSE  107 (H) 04/23/2017   CHOL 142 02/09/2017   TRIG 87.0 02/09/2017   HDL 35.90 (L) 02/09/2017   LDLCALC 88 02/09/2017   ALT 16 02/09/2017   AST 17 02/09/2017   NA 138 04/23/2017   K 4.1 04/23/2017   CL 103 04/23/2017   CREATININE 1.61 (H) 04/23/2017   BUN 20 04/23/2017   CO2 30 04/23/2017   TSH 0.97 04/23/2017   PSA 1.19 10/06/2016       Assessment & Plan:   Problem List Items Addressed This Visit    CKD (chronic kidney disease), stage III (HCC)    Avoid antiinflammatories.  Recheck metabolic panel today.        GERD (gastroesophageal reflux disease)    Increased acid reflux.  Start zantac as directed.        Hypercholesterolemia    Low cholesterol diet and exercise.  Follow lipid panel.        Hypertension    Blood pressure on recheck wnl.  Has been under better control on his outside checks.  Will decrease amlodipine to 5mg  q day.  Continue lisinopril.  Follow metabolic panel.        Relevant Orders   CBC with Differential/Platelet (Completed)   Basic metabolic panel (Completed)   Stress    Increased stress.  Discussed with him today.  Does not feel needs anything more at this time.  Follow.        Tachycardia - Primary    Some increased heart rate at night.  Wakes him up.  EKG - SR with no acute ischemic changes.  Discussed further cardiac w/up.  Refer to cardiology for further evaluation.  Pt in agreement.  Also discussed concern regarding sleep apnea given increased snoring, wakes up with tachycardia, etc.  Will schedule split night sleep study.  Also has intermittent episodes of the foggy headed sensation, etc - as outlined.  Plan cardiac w/up as outlined.  Treat acid reflux.        Relevant Orders   TSH (Completed)   EKG 12-Lead (Completed)   Ambulatory referral to Sleep Studies   Ambulatory referral to Cardiology    Other Visit Diagnoses    Snoring       Relevant Orders   Ambulatory referral to Sleep Studies       Dale [place], MD

## 2017-04-24 ENCOUNTER — Encounter: Payer: Self-pay | Admitting: Internal Medicine

## 2017-04-25 ENCOUNTER — Encounter: Payer: Self-pay | Admitting: Internal Medicine

## 2017-04-25 DIAGNOSIS — K219 Gastro-esophageal reflux disease without esophagitis: Secondary | ICD-10-CM | POA: Insufficient documentation

## 2017-04-25 NOTE — Assessment & Plan Note (Signed)
Increased acid reflux.  Start zantac as directed.

## 2017-04-25 NOTE — Assessment & Plan Note (Signed)
Increased stress.  Discussed with him today.  Does not feel needs anything more at this time.  Follow.

## 2017-04-25 NOTE — Assessment & Plan Note (Signed)
Blood pressure on recheck wnl.  Has been under better control on his outside checks.  Will decrease amlodipine to 5mg  q day.  Continue lisinopril.  Follow metabolic panel.

## 2017-04-25 NOTE — Assessment & Plan Note (Signed)
Low cholesterol diet and exercise.  Follow lipid panel.   

## 2017-04-25 NOTE — Assessment & Plan Note (Addendum)
Some increased heart rate at night.  Wakes him up.  EKG - SR with no acute ischemic changes.  Discussed further cardiac w/up.  Refer to cardiology for further evaluation.  Pt in agreement.  Also discussed concern regarding sleep apnea given increased snoring, wakes up with tachycardia, etc.  Will schedule split night sleep study.  Also has intermittent episodes of the foggy headed sensation, etc - as outlined.  Plan cardiac w/up as outlined.  Treat acid reflux.

## 2017-04-25 NOTE — Assessment & Plan Note (Signed)
Avoid antiinflammatories.  Recheck metabolic panel today.   

## 2017-05-07 ENCOUNTER — Telehealth: Payer: Self-pay | Admitting: Cardiovascular Disease

## 2017-05-07 NOTE — Telephone Encounter (Signed)
**  [facility]

## 2017-05-13 ENCOUNTER — Other Ambulatory Visit: Payer: Self-pay | Admitting: Internal Medicine

## 2017-06-04 ENCOUNTER — Ambulatory Visit: Payer: Managed Care, Other (non HMO) | Attending: Neurology

## 2017-06-04 DIAGNOSIS — I10 Essential (primary) hypertension: Secondary | ICD-10-CM | POA: Diagnosis not present

## 2017-06-04 DIAGNOSIS — R0683 Snoring: Secondary | ICD-10-CM | POA: Insufficient documentation

## 2017-06-04 DIAGNOSIS — G4733 Obstructive sleep apnea (adult) (pediatric): Secondary | ICD-10-CM | POA: Insufficient documentation

## 2017-06-11 ENCOUNTER — Encounter: Payer: Self-pay | Admitting: Internal Medicine

## 2017-06-11 ENCOUNTER — Ambulatory Visit (INDEPENDENT_AMBULATORY_CARE_PROVIDER_SITE_OTHER): Payer: Managed Care, Other (non HMO) | Admitting: Internal Medicine

## 2017-06-11 VITALS — BP 134/76 | HR 71 | Temp 97.8°F | Ht 72.0 in | Wt 221.4 lb

## 2017-06-11 DIAGNOSIS — E7800 Pure hypercholesterolemia, unspecified: Secondary | ICD-10-CM | POA: Diagnosis not present

## 2017-06-11 DIAGNOSIS — N183 Chronic kidney disease, stage 3 unspecified: Secondary | ICD-10-CM

## 2017-06-11 DIAGNOSIS — I10 Essential (primary) hypertension: Secondary | ICD-10-CM

## 2017-06-11 DIAGNOSIS — R269 Unspecified abnormalities of gait and mobility: Secondary | ICD-10-CM

## 2017-06-11 DIAGNOSIS — F439 Reaction to severe stress, unspecified: Secondary | ICD-10-CM

## 2017-06-11 LAB — BASIC METABOLIC PANEL
BUN: 18 mg/dL (ref 6–23)
CALCIUM: 9.1 mg/dL (ref 8.4–10.5)
CHLORIDE: 102 meq/L (ref 96–112)
CO2: 29 meq/L (ref 19–32)
CREATININE: 1.5 mg/dL (ref 0.40–1.50)
GFR: 51.27 mL/min — ABNORMAL LOW (ref >60.00)
Glucose, Bld: 78 mg/dL (ref 70–99)
Potassium: 4.6 mEq/L (ref 3.5–5.1)
Sodium: 136 mEq/L (ref 135–145)

## 2017-06-11 MED ORDER — AMLODIPINE BESYLATE 5 MG PO TABS: 5.0000 mg | ORAL_TABLET | Freq: Every day | ORAL | 3 refills | Status: DC

## 2017-06-11 NOTE — Progress Notes (Signed)
Patient ID: Rodney Benjamin, male   DOB: 30-Aug-1960, 56 y.o.   MRN: [id number]   Subjective:    Patient ID: Rodney Benjamin, male    DOB: 30-Aug-1960, 56 y.o.   MRN: [id number]  HPI  Patient here for a scheduled follow up.  Here to f/u on his blood pressure.  Taking 1/2 amlodipine and 10mg  of lisinopril.  Sates blood pressures averaging 120-low 130s systolic.  No chest pain.  No sob.  No acid reflux.  No abdominal pain.  Bowels moving.  No urinary complaints.  Following kidney function.  Did get his glasses changed.  Feels better.  Was noticing intermittent drifting to the left - when walking.  Since glasses changed, feels better.  Has not had any issues in the last few days.     Past Medical History:  Diagnosis Date  . History of chicken pox   . Hypercholesterolemia   . Hypertension    Past Surgical History:  Procedure Laterality Date  . INNER EAR SURGERY     cholesteatoma removed x 2  . KNEE SURGERY  1982   right  . TONSILLECTOMY     Family History  Problem Relation Age of Onset  . Prostate cancer Unknown   . Hypertension Unknown   . Breast cancer Unknown    Social History   Socioeconomic History  . Marital status: Married    Spouse name: None  . Number of children: None  . Years of education: None  . Highest education level: None  Social Needs  . Financial resource strain: None  . Food insecurity - worry: None  . Food insecurity - inability: None  . Transportation needs - medical: None  . Transportation needs - non-medical: None  Occupational History  . None  Tobacco Use  . Smoking status: Never Smoker  . Smokeless tobacco: Never Used  Substance and Sexual Activity  . Alcohol use: No    Alcohol/week: 0.0 oz  . Drug use: No  . Sexual activity: None  Other Topics Concern  . None  Social History Narrative  . None    Outpatient Encounter Medications as of 06/11/2017  Medication Sig  . lisinopril (PRINIVIL,ZESTRIL) 10 MG tablet TAKE ONE TABLET BY  MOUTH DAILY  . [DISCONTINUED] amLODipine (NORVASC) 10 MG tablet TAKE ONE TABLET EVERY DAY  . amLODipine (NORVASC) 5 MG tablet Take 1 tablet (5 mg total) by mouth daily.   No facility-administered encounter medications on file as of 06/11/2017.     Review of Systems  Constitutional: Negative for appetite change and unexpected weight change.  HENT: Negative for congestion and sinus pressure.   Respiratory: Negative for cough, chest tightness and shortness of breath.   Cardiovascular: Negative for chest pain, palpitations and leg swelling.  Gastrointestinal: Negative for abdominal pain, diarrhea and vomiting.  Genitourinary: Negative for difficulty urinating and dysuria.  Musculoskeletal: Negative for joint swelling and myalgias.  Skin: Negative for color change and rash.  Neurological: Negative for dizziness, light-headedness and headaches.  Psychiatric/Behavioral: Negative for agitation and dysphoric mood.       Objective:    Physical Exam  Constitutional: He appears well-developed and well-nourished. No distress.  HENT:  Nose: Nose normal.  Mouth/Throat: Oropharynx is clear and moist.  Neck: Neck supple. No thyromegaly present.  Cardiovascular: Normal rate and regular rhythm.  Pulmonary/Chest: Effort normal and breath sounds normal. No respiratory distress.  Abdominal: Soft. Bowel sounds are normal. There is no tenderness.  Musculoskeletal: He exhibits no edema or tenderness.  Lymphadenopathy:    He has no cervical adenopathy.  Skin: No rash noted. No erythema.  Psychiatric: He has a normal mood and affect. His behavior is normal.    BP 134/76 (BP Location: Right Arm, Patient Position: Sitting, Cuff Size: Normal)   Pulse 71   Temp 97.8 F (36.6 C) (Oral)   Ht 6' (1.829 m)   Wt 221 lb 6.4 oz (100.4 kg)   SpO2 95%   BMI 30.03 kg/m  Wt Readings from Last 3 Encounters:  06/11/17 221 lb 6.4 oz (100.4 kg)  04/23/17 215 lb 9.6 oz (97.8 kg)  02/12/17 221 lb 8 oz (100.5 kg)       Lab Results  Component Value Date   WBC 5.5 04/23/2017   HGB 14.6 04/23/2017   HCT 43.5 04/23/2017   PLT 175.0 04/23/2017   GLUCOSE 78 06/11/2017   CHOL 142 02/09/2017   TRIG 87.0 02/09/2017   HDL 35.90 (L) 02/09/2017   LDLCALC 88 02/09/2017   ALT 16 02/09/2017   AST 17 02/09/2017   NA 136 06/11/2017   K 4.6 06/11/2017   CL 102 06/11/2017   CREATININE 1.50 06/11/2017   BUN 18 06/11/2017   CO2 29 06/11/2017   TSH 0.97 04/23/2017   PSA 1.19 10/06/2016       Assessment & Plan:   Problem List Items Addressed This Visit    CKD (chronic kidney disease), stage III (HCC) - Primary    Avoid antiinflammatories.  Blood pressure doing well.  Same medication regimen.  Check metabolic panel.       Relevant Orders   Basic metabolic panel (Completed)   Hypercholesterolemia    Low cholesterol diet and exercise.  Follow lipid panel.        Relevant Medications   amLODipine (NORVASC) 5 MG tablet   Hypertension    Blood pressure under good control.  Continue same medication regimen.  Follow pressures.  Follow metabolic panel.        Relevant Medications   amLODipine (NORVASC) 5 MG tablet   Stress    Overall he feels he is handling things well.  Follow.        Other Visit Diagnoses    Gait abnormality       Occasional drifting.  Appears to be better now.  Had glasses changed.  Discussed further w/up.  Wants to monitor. Will notify me if persistent/recurs.         Dale DurhamSCOTT, Daneesha Quinteros, MD

## 2017-06-12 ENCOUNTER — Other Ambulatory Visit: Payer: Self-pay | Admitting: Internal Medicine

## 2017-06-12 ENCOUNTER — Telehealth: Payer: Self-pay

## 2017-06-12 NOTE — Telephone Encounter (Signed)
Left message with someone for patient to return call  Called to r/s appt due to provider out of office

## 2017-06-14 ENCOUNTER — Encounter: Payer: Self-pay | Admitting: Internal Medicine

## 2017-06-14 NOTE — Assessment & Plan Note (Signed)
Low cholesterol diet and exercise.  Follow lipid panel.   

## 2017-06-14 NOTE — Assessment & Plan Note (Signed)
Overall he feels he is handling things well.  Follow.  

## 2017-06-14 NOTE — Assessment & Plan Note (Signed)
Avoid antiinflammatories.  Blood pressure doing well.  Same medication regimen.  Check metabolic panel.

## 2017-06-14 NOTE — Assessment & Plan Note (Signed)
Blood pressure under good control.  Continue same medication regimen.  Follow pressures.  Follow metabolic panel.   

## 2017-06-29 ENCOUNTER — Encounter: Payer: Self-pay | Admitting: Internal Medicine

## 2017-06-29 DIAGNOSIS — R232 Flushing: Secondary | ICD-10-CM

## 2017-06-29 DIAGNOSIS — R2681 Unsteadiness on feet: Secondary | ICD-10-CM

## 2017-06-29 NOTE — Telephone Encounter (Signed)
See pts my chart message.  Please call pt and let him know that I have placed the order for the carotid ultrasound.  Someone should be contacting him with an appt date and time.  Regarding the sleep study, the original sleep study revealed sleep apnea.  He needs the cpap titration now.  Has he had this done?  I had placed a signed order in the box on this pt.  Do you have this order?

## 2017-06-30 NOTE — Telephone Encounter (Signed)
I signed an order regarding his sleep study.  Need to find copy of this and contact sleep med and get this scheduled.

## 2017-06-30 NOTE — Telephone Encounter (Signed)
This was sent to me twice.  See other note.

## 2017-06-30 NOTE — Telephone Encounter (Signed)
I have not received anything for a cpap titration. Can you order it? Thanks,MT

## 2017-07-02 ENCOUNTER — Ambulatory Visit: Payer: Managed Care, Other (non HMO) | Attending: Neurology

## 2017-07-02 DIAGNOSIS — G4733 Obstructive sleep apnea (adult) (pediatric): Secondary | ICD-10-CM | POA: Diagnosis present

## 2017-07-03 ENCOUNTER — Encounter: Payer: Self-pay | Admitting: Internal Medicine

## 2017-07-06 NOTE — Telephone Encounter (Signed)
There is a vascular ultrasound ordered under CV procedure.  I am sending this note to you and to [facility].  Let me know if there is something more I need to do to schedule.

## 2017-07-10 ENCOUNTER — Ambulatory Visit (INDEPENDENT_AMBULATORY_CARE_PROVIDER_SITE_OTHER): Payer: Managed Care, Other (non HMO)

## 2017-07-10 DIAGNOSIS — R232 Flushing: Secondary | ICD-10-CM

## 2017-07-10 DIAGNOSIS — R2681 Unsteadiness on feet: Secondary | ICD-10-CM

## 2017-07-13 ENCOUNTER — Ambulatory Visit: Payer: Managed Care, Other (non HMO) | Admitting: Cardiovascular Disease

## 2017-07-15 ENCOUNTER — Encounter: Payer: Self-pay | Admitting: Internal Medicine

## 2017-07-22 ENCOUNTER — Telehealth: Payer: Self-pay | Admitting: Internal Medicine

## 2017-07-22 NOTE — Telephone Encounter (Signed)
Notified patient of sleep study results and need for CPAP. Order faxed.

## 2017-07-23 ENCOUNTER — Encounter: Payer: Self-pay | Admitting: Cardiovascular Disease

## 2017-07-23 ENCOUNTER — Ambulatory Visit (INDEPENDENT_AMBULATORY_CARE_PROVIDER_SITE_OTHER): Payer: Managed Care, Other (non HMO) | Admitting: Cardiovascular Disease

## 2017-07-23 VITALS — BP 146/82 | HR 79 | Ht 73.0 in | Wt 225.5 lb

## 2017-07-23 DIAGNOSIS — R002 Palpitations: Secondary | ICD-10-CM

## 2017-07-23 DIAGNOSIS — I10 Essential (primary) hypertension: Secondary | ICD-10-CM

## 2017-07-23 NOTE — Progress Notes (Signed)
Cardiology Office Note   Date:  07/23/2017   ID:  Toma DeitersDarryl D Deere, DOB 1960-11-23, MRN [id number]  PCP:  Dale DurhamScott, Charlene, MD  Cardiologist:   Lorine BearsMuhammad Louis Ivery, MD   Chief Complaint  Patient presents with  . New Patient (Initial Visit)    Referred by Dr. Lorin PicketScott for Tachycardia and a flutter. Meds reviewed verbally with patient.       History of Present Illness: Amadu D Buford Dresserennington is a 57 y.o. male who was referred by Dr. Lorin PicketScott for evaluation of palpitation and tachycardia.  He has known history of hypertension, stage III chronic kidney disease, GERD and hyperlipidemia. He reports nighttime tachycardia that wakes him up from sleep.  He reported symptoms suggestive of sleep apnea.  Sleep study showed moderate sleep apnea.  He has not started using CPAP yet. The patient had previous cardiac workup done in November 2016 at [facility] before donating his kidney to his son.  He had an echocardiogram that showed normal LV systolic function without significant valvular abnormalities.  A treadmill stress test was normal and he was able to exercise for 10 minutes. He reports fluttering and skipping in his heart which is random and can happen any time.  This is associated with mild dizziness but no syncope or presyncope.  No chest pain or shortness of breath.  He tries to exercise daily and denies exertional symptoms. He has normal blood pressure readings at home likely has a component of whitecoat syndrome. He is not a smoker and does not drink caffeinated products.  He has family history of atrial fibrillation but no coronary artery disease.    Past Medical History:  Diagnosis Date  . History of chicken pox   . Hypercholesterolemia   . Hypertension     Past Surgical History:  Procedure Laterality Date  . INNER EAR SURGERY     cholesteatoma removed x 2  . KNEE SURGERY  1982   right  . TONSILLECTOMY       Current Outpatient Medications  Medication Sig Dispense Refill  . amLODipine  (NORVASC) 5 MG tablet Take 1 tablet (5 mg total) by mouth daily. 30 tablet 3  . lisinopril (PRINIVIL,ZESTRIL) 10 MG tablet TAKE ONE TABLET EVERY DAY 30 tablet 2   No current facility-administered medications for this visit.     Allergies:   Patient has no known allergies.    Social History:  The patient  reports that  has never smoked. he has never used smokeless tobacco. He reports that he does not drink alcohol or use drugs.   Family History:  The patient's family history includes Breast cancer in his unknown relative; Hypertension in his unknown relative; Prostate cancer in his unknown relative.    ROS:  Please see the history of present illness.   Otherwise, review of systems are positive for none.   All other systems are reviewed and negative.    PHYSICAL EXAM: VS:  BP (!) 146/82 (BP Location: Right Arm, Patient Position: Sitting, Cuff Size: Normal)   Pulse 79   Ht 6\' 1"  (1.854 m)   Wt 225 lb 8 oz (102.3 kg)   BMI 29.75 kg/m  , BMI Body mass index is 29.75 kg/m. GEN: Well nourished, well developed, in no acute distress  HEENT: normal  Neck: no JVD, carotid bruits, or masses Cardiac: RRR; no murmurs, rubs, or gallops,no edema  Respiratory:  clear to auscultation bilaterally, normal work of breathing GI: soft, nontender, nondistended, + BS MS: no deformity or  atrophy  Skin: warm and dry, no rash Neuro:  Strength and sensation are intact Psych: euthymic mood, full affect   EKG:  EKG is ordered today. The ekg ordered today demonstrates normal sinus rhythm with nonspecific ST changes.   Recent Labs: 02/09/2017: ALT 16 04/23/2017: Hemoglobin 14.6; Platelets 175.0; TSH 0.97 06/11/2017: BUN 18; Creatinine, Ser 1.50; Potassium 4.6; Sodium 136    Lipid Panel    Component Value Date/Time   CHOL 142 02/09/2017 0812   TRIG 87.0 02/09/2017 0812   HDL 35.90 (L) 02/09/2017 0812   CHOLHDL 4 02/09/2017 0812   VLDL 17.4 02/09/2017 0812   LDLCALC 88 02/09/2017 0812      Wt  Readings from Last 3 Encounters:  07/23/17 225 lb 8 oz (102.3 kg)  06/11/17 221 lb 6.4 oz (100.4 kg)  04/23/17 215 lb 9.6 oz (97.8 kg)       PAD Screen 07/23/2017  Previous PAD dx? No  Previous surgical procedure? No  Pain with walking? No  Feet/toe relief with dangling? No  Painful, non-healing ulcers? No  Extremities discolored? No      ASSESSMENT AND PLAN:  1.  Palpitations: Likely due to premature beats based on his description.  The patient had previous cardiac workup in November 2016 including stress test and echocardiogram.  Both were unremarkable.  His cardiac exam currently is normal and baseline ECG does not show significant abnormalities. I requested a 48-hour Holter monitor but overall I do not suspect serious arrhythmia.  Some of his tachycardia at night might be related to sleep apnea and that hopefully should improve with CPAP.  2.  Essential hypertension: His blood pressure is mildly elevated today but his home blood pressure readings are optimal.  Continue amlodipine and lisinopril.    Disposition:   FU with me as needed.  Signed,  Lorine Bears, MD  07/23/2017 12:46 PM    [facility]

## 2017-07-23 NOTE — Patient Instructions (Addendum)
Medication Instructions:  Your physician recommends that you continue on your current medications as directed. Please refer to the Current Medication list given to you today.   Labwork: none  Testing/Procedures: Your physician has recommended that you wear a holter monitor. Holter monitors are medical devices that record the heart's electrical activity. Doctors most often use these monitors to diagnose arrhythmias. Arrhythmias are problems with the speed or rhythm of the heartbeat. The monitor is a small, portable device. You can wear one while you do your normal daily activities. This is usually used to diagnose what is causing palpitations/syncope (passing out).    Follow-Up: Your physician recommends that you schedule a follow-up appointment as needed.    Any Other Special Instructions Will Be Listed Below (If Applicable).     If you need a refill on your cardiac medications before your next appointment, please call your pharmacy.   Holter Monitoring A Holter monitor is a small device that is used to detect abnormal heart rhythms. It clips to your clothing and is connected by wires to flat, sticky disks (electrodes) that attach to your chest. It is worn continuously for 24-48 hours. Follow these instructions at home:  Wear your Holter monitor at all times, even while exercising and sleeping, for as long as directed by your health care provider.  Make sure that the Holter monitor is safely clipped to your clothing or close to your body as recommended by your health care provider.  Do not get the monitor or wires wet.  Do not put body lotion or moisturizer on your chest.  Keep your skin clean.  Keep a diary of your daily activities, such as walking and doing chores. If you feel that your heartbeat is abnormal or that your heart is fluttering or skipping a beat: ? Record what you are doing when it happens. ? Record what time of day the symptoms occur.  Return your Holter  monitor as directed by your health care provider.  Keep all follow-up visits as directed by your health care provider. This is important. Get help right away if:  You feel lightheaded or you faint.  You have trouble breathing.  You feel pain in your chest, upper arm, or jaw.  You feel sick to your stomach and your skin is pale, cool, or damp.  You heartbeat feels unusual or abnormal. This information is not intended to replace advice given to you by your health care provider. Make sure you discuss any questions you have with your health care provider. Document Released: 03/07/2004 Document Revised: 11/15/2015 Document Reviewed: 01/16/2014 Elsevier Interactive Patient Education  2018 [employer]vier Inc[employer]  

## 2017-07-28 ENCOUNTER — Ambulatory Visit (INDEPENDENT_AMBULATORY_CARE_PROVIDER_SITE_OTHER): Payer: Managed Care, Other (non HMO)

## 2017-07-28 DIAGNOSIS — R002 Palpitations: Secondary | ICD-10-CM | POA: Diagnosis not present

## 2017-08-03 ENCOUNTER — Ambulatory Visit
Admission: RE | Admit: 2017-08-03 | Discharge: 2017-08-03 | Disposition: A | Discharge status: Home / Self Care | Payer: Managed Care, Other (non HMO) | Source: Ambulatory Visit | Attending: Cardiovascular Disease | Admitting: Cardiovascular Disease

## 2017-08-03 DIAGNOSIS — R002 Palpitations: Secondary | ICD-10-CM | POA: Insufficient documentation

## 2017-09-21 ENCOUNTER — Other Ambulatory Visit: Payer: Self-pay | Admitting: Internal Medicine

## 2017-12-08 ENCOUNTER — Other Ambulatory Visit: Payer: Self-pay | Admitting: Internal Medicine

## 2017-12-15 ENCOUNTER — Ambulatory Visit (INDEPENDENT_AMBULATORY_CARE_PROVIDER_SITE_OTHER): Payer: Managed Care, Other (non HMO) | Admitting: Internal Medicine

## 2017-12-15 ENCOUNTER — Encounter: Payer: Self-pay | Admitting: Internal Medicine

## 2017-12-15 ENCOUNTER — Ambulatory Visit (INDEPENDENT_AMBULATORY_CARE_PROVIDER_SITE_OTHER): Payer: Managed Care, Other (non HMO)

## 2017-12-15 VITALS — BP 134/86 | HR 72 | Temp 98.0°F | Resp 16 | Wt 230.0 lb

## 2017-12-15 DIAGNOSIS — E7800 Pure hypercholesterolemia, unspecified: Secondary | ICD-10-CM | POA: Diagnosis not present

## 2017-12-15 DIAGNOSIS — R51 Headache: Secondary | ICD-10-CM

## 2017-12-15 DIAGNOSIS — I10 Essential (primary) hypertension: Secondary | ICD-10-CM

## 2017-12-15 DIAGNOSIS — N183 Chronic kidney disease, stage 3 unspecified: Secondary | ICD-10-CM

## 2017-12-15 DIAGNOSIS — M542 Cervicalgia: Secondary | ICD-10-CM | POA: Diagnosis not present

## 2017-12-15 DIAGNOSIS — M5020 Other cervical disc displacement, unspecified cervical region: Secondary | ICD-10-CM

## 2017-12-15 DIAGNOSIS — Z125 Encounter for screening for malignant neoplasm of prostate: Secondary | ICD-10-CM | POA: Diagnosis not present

## 2017-12-15 DIAGNOSIS — R519 Headache, unspecified: Secondary | ICD-10-CM

## 2017-12-15 LAB — CBC WITH DIFFERENTIAL/PLATELET
BASOS PCT: 0.6 % (ref 0.0–3.0)
Basophils Absolute: 0 10*3/uL (ref 0.0–0.1)
EOS PCT: 2 % (ref 0.0–5.0)
Eosinophils Absolute: 0.1 10*3/uL (ref 0.0–0.7)
HEMATOCRIT: 42.6 % (ref 39.0–52.0)
Hemoglobin: 14.5 g/dL (ref 13.0–17.0)
LYMPHS PCT: 29.2 % (ref 12.0–46.0)
Lymphs Abs: 1.7 10*3/uL (ref 0.7–4.0)
MCHC: 34.1 g/dL (ref 30.0–36.0)
MCV: 92.1 fl (ref 78.0–100.0)
MONOS PCT: 8.4 % (ref 3.0–12.0)
Monocytes Absolute: 0.5 10*3/uL (ref 0.1–1.0)
NEUTROS ABS: 3.4 10*3/uL (ref 1.4–7.7)
Neutrophils Relative %: 59.8 % (ref 43.0–77.0)
PLATELETS: 169 10*3/uL (ref 150.0–400.0)
RBC: 4.63 Mil/uL (ref 4.22–5.81)
RDW: 12.8 % (ref 11.5–15.5)
WBC: 5.7 10*3/uL (ref 4.0–10.5)

## 2017-12-15 LAB — HEPATIC FUNCTION PANEL
ALBUMIN: 4.3 g/dL (ref 3.5–5.2)
ALK PHOS: 43 U/L (ref 39–117)
ALT: 20 U/L (ref 0–53)
AST: 18 U/L (ref 0–37)
Bilirubin, Direct: 0.1 mg/dL (ref 0.0–0.3)
TOTAL PROTEIN: 7.2 g/dL (ref 6.0–8.3)
Total Bilirubin: 0.7 mg/dL (ref 0.2–1.2)

## 2017-12-15 LAB — LIPID PANEL
Cholesterol: 170 mg/dL (ref 0–200)
HDL: 37.2 mg/dL — AB (ref >39.00)
LDL Cholesterol: 113 mg/dL — ABNORMAL HIGH (ref 0–99)
NONHDL: 132.71
TRIGLYCERIDES: 98 mg/dL (ref 0.0–149.0)
Total CHOL/HDL Ratio: 5
VLDL: 19.6 mg/dL (ref 0.0–40.0)

## 2017-12-15 LAB — SEDIMENTATION RATE: Sed Rate: 9 mm/hr (ref 0–20)

## 2017-12-15 LAB — BASIC METABOLIC PANEL
BUN: 21 mg/dL (ref 6–23)
CHLORIDE: 102 meq/L (ref 96–112)
CO2: 28 mEq/L (ref 19–32)
Calcium: 9.3 mg/dL (ref 8.4–10.5)
Creatinine, Ser: 1.6 mg/dL — ABNORMAL HIGH (ref 0.40–1.50)
GFR: 47.5 mL/min — ABNORMAL LOW (ref >60.00)
Glucose, Bld: 99 mg/dL (ref 70–99)
POTASSIUM: 4.4 meq/L (ref 3.5–5.1)
Sodium: 136 mEq/L (ref 135–145)

## 2017-12-15 LAB — PSA: PSA: 1.87 ng/mL (ref 0.10–4.00)

## 2017-12-15 NOTE — Progress Notes (Signed)
Patient ID: Rodney Benjamin, male   DOB: September 20, 1960, 57 y.o.   MRN: [id number]   Subjective:    Patient ID: Rodney Benjamin, male    DOB: 1960/07/25, 57 y.o.   MRN: [id number]  HPI  Patient here as an acute visit to discuss persistent headaches.  He reports he has been having issues now for a while.  More noticeable over the last month.  States discomfort will start in the back of his neck (right side) and move up the right side of his head.  Will then notice a dull sensation on the top of his head.  No vision change.  No light sensitivity.  Has noticed some discomfort in the same area when looking up and to the right.  Occurs most days.  Feels better after working out.  Not present when first wakes up.  Starts after he is up and moving around.  No actual dizziness or light headedness.  Blood pressure has been doing well - averaging 120/70s.  No chest pain.  No sob.  No nausea or vomiting.  No rash or joint aches.  No fever.  No increased sinus issues.  No injury or trauma.  Does have history of cholesteatoma.  Previous surgery.     Past Medical History:  Diagnosis Date  . History of chicken pox   . Hypercholesterolemia   . Hypertension    Past Surgical History:  Procedure Laterality Date  . INNER EAR SURGERY     cholesteatoma removed x 2  . [place]   right  . TONSILLECTOMY     Family History  Problem Relation Age of Onset  . Prostate cancer Unknown   . Hypertension Unknown   . Breast cancer Unknown    Social History   Socioeconomic History  . Marital status: Married    Spouse name: Not on file  . Number of children: Not on file  . Years of education: Not on file  . Highest education level: Not on file  Occupational History  . Not on file  Social Needs  . Financial resource strain: Not on file  . Food insecurity:    Worry: Not on file    Inability: Not on file  . Transportation needs:    Medical: Not on file    Non-medical: Not on file  Tobacco Use    . Smoking status: Never Smoker  . Smokeless tobacco: Never Used  Substance and Sexual Activity  . Alcohol use: No    Alcohol/week: 0.0 oz  . Drug use: No  . Sexual activity: Not on file  Lifestyle  . Physical activity:    Days per week: Not on file    Minutes per session: Not on file  . Stress: Not on file  Relationships  . Social connections:    Talks on phone: Not on file    Gets together: Not on file    Attends religious service: Not on file    Active member of club or organization: Not on file    Attends meetings of clubs or organizations: Not on file    Relationship status: Not on file  Other Topics Concern  . Not on file  Social History Narrative  . Not on file    Outpatient Encounter Medications as of 12/15/2017  Medication Sig  . amLODipine (NORVASC) 5 MG tablet Take 1 tablet (5 mg total) by mouth daily.  Marland Kitchen lisinopril (PRINIVIL,ZESTRIL) 10 MG tablet TAKE 1 TABLET DAILY   No  facility-administered encounter medications on file as of 12/15/2017.     Review of Systems  Constitutional: Negative for appetite change and fever.  HENT: Negative for congestion and sinus pressure.   Respiratory: Negative for chest tightness and shortness of breath.   Cardiovascular: Negative for chest pain and leg swelling.  Gastrointestinal: Negative for abdominal pain, diarrhea and nausea.  Musculoskeletal: Negative for joint swelling and myalgias.       Neck pain as outlined.    Skin: Negative for color change and rash.  Neurological: Positive for headaches. Negative for dizziness and light-headedness.  Psychiatric/Behavioral: Negative for agitation and dysphoric mood.       Objective:     Blood pressure rechecked by me:  136/82  Physical Exam  Constitutional: He appears well-developed and well-nourished. No distress.  HENT:  Nose: Nose normal.  Mouth/Throat: Oropharynx is clear and moist.  Eyes: Pupils are equal, round, and reactive to light.  Fundi visualized appeared to be  benign.    Neck: Neck supple.  Cardiovascular: Normal rate and regular rhythm.  Pulmonary/Chest: Effort normal and breath sounds normal. No respiratory distress.  Abdominal: Soft. Bowel sounds are normal. There is no tenderness.  Musculoskeletal: He exhibits no edema or tenderness.  Some increased discomfort with looking up and to the right - noted right lateral neck.    Lymphadenopathy:    He has no cervical adenopathy.  Neurological:  No focal neuro deficits noted.    Skin: No rash noted. No erythema.  Psychiatric: He has a normal mood and affect. His behavior is normal.    BP 134/86 (BP Location: Left Arm, Patient Position: Sitting, Cuff Size: Large)   Pulse 72   Temp 98 F (36.7 C) (Oral)   Resp 16   Wt 230 lb (104.3 kg)   SpO2 97%   BMI 30.34 kg/m  Wt Readings from Last 3 Encounters:  12/15/17 230 lb (104.3 kg)  07/23/17 225 lb 8 oz (102.3 kg)  06/11/17 221 lb 6.4 oz (100.4 kg)     Lab Results  Component Value Date   WBC 5.7 12/15/2017   HGB 14.5 12/15/2017   HCT 42.6 12/15/2017   PLT 169.0 12/15/2017   GLUCOSE 99 12/15/2017   CHOL 170 12/15/2017   TRIG 98.0 12/15/2017   HDL 37.20 (L) 12/15/2017   LDLCALC 113 (H) 12/15/2017   ALT 20 12/15/2017   AST 18 12/15/2017   NA 136 12/15/2017   K 4.4 12/15/2017   CL 102 12/15/2017   CREATININE 1.60 (H) 12/15/2017   BUN 21 12/15/2017   CO2 28 12/15/2017   TSH 0.97 04/23/2017   PSA 1.87 12/15/2017       Assessment & Plan:   Problem List Items Addressed This Visit    Cervical disc herniation    Is s/p discectomy and fusion.  With some neck issues as outlined.  Question if contributing to headache.  Notices some discomfort with looking up and to the right.  Tylenol as directed.  Check c-spine xray. Avoid antiinflammatories.        CKD (chronic kidney disease), stage III (HCC)    Avoid antiinflammatories.  Blood pressure stable.  Check metabolic panel.       Headache    Persistent issue as outlined.  Not  present when first wakes up.  Some discomfort with looking up and to the right.  Headache - dull over top of head.  No focal neuro deficits noted on exam.  Will check c-spine xray.  Tylenol  as directed.  Also check esr.  Has a history of cholesteatoma - surgery.  Discussed possible scanning.  Will review above labs and xray first.  Pt in agreement.        Relevant Orders   DG Cervical Spine 2 or 3 views (Completed)   CBC with Differential/Platelet (Completed)   Sedimentation rate (Completed)   Hypercholesterolemia    Low cholesterol diet and exercise.  Check lipid panel.       Relevant Orders   Hepatic function panel (Completed)   Lipid panel (Completed)   Hypertension    Blood pressure as outlined.  Continue current medication regimen.  Check metabolic panel.       Relevant Orders   Basic metabolic panel (Completed)    Other Visit Diagnoses    Neck pain    -  Primary   Relevant Orders   DG Cervical Spine 2 or 3 views (Completed)   Prostate cancer screening       Relevant Orders   PSA (Completed)       Einar Pheasant, MD

## 2017-12-16 ENCOUNTER — Encounter: Payer: Self-pay | Admitting: Internal Medicine

## 2017-12-16 NOTE — Assessment & Plan Note (Signed)
Blood pressure as outlined.  Continue current medication regimen.  Check metabolic panel.

## 2017-12-16 NOTE — Assessment & Plan Note (Signed)
Low cholesterol diet and exercise.  Check lipid panel.   

## 2017-12-16 NOTE — Assessment & Plan Note (Signed)
Is s/p discectomy and fusion.  With some neck issues as outlined.  Question if contributing to headache.  Notices some discomfort with looking up and to the right.  Tylenol as directed.  Check c-spine xray. Avoid antiinflammatories.

## 2017-12-16 NOTE — Assessment & Plan Note (Signed)
Persistent issue as outlined.  Not present when first wakes up.  Some discomfort with looking up and to the right.  Headache - dull over top of head.  No focal neuro deficits noted on exam.  Will check c-spine xray.  Tylenol as directed.  Also check esr.  Has a history of cholesteatoma - surgery.  Discussed possible scanning.  Will review above labs and xray first.  Pt in agreement.

## 2017-12-16 NOTE — Assessment & Plan Note (Signed)
Avoid antiinflammatories.  Blood pressure stable.  Check metabolic panel.

## 2017-12-21 ENCOUNTER — Encounter: Payer: Self-pay | Admitting: Internal Medicine

## 2018-01-07 ENCOUNTER — Other Ambulatory Visit: Payer: Self-pay | Admitting: Internal Medicine

## 2018-01-20 ENCOUNTER — Encounter: Payer: Self-pay | Admitting: Internal Medicine

## 2018-03-18 ENCOUNTER — Other Ambulatory Visit: Payer: Self-pay | Admitting: Internal Medicine

## 2018-04-15 ENCOUNTER — Encounter: Payer: Self-pay | Admitting: Internal Medicine

## 2018-04-15 ENCOUNTER — Ambulatory Visit (INDEPENDENT_AMBULATORY_CARE_PROVIDER_SITE_OTHER): Payer: Managed Care, Other (non HMO) | Admitting: Internal Medicine

## 2018-04-15 ENCOUNTER — Ambulatory Visit (INDEPENDENT_AMBULATORY_CARE_PROVIDER_SITE_OTHER): Payer: Managed Care, Other (non HMO)

## 2018-04-15 VITALS — BP 120/74 | HR 84 | Temp 98.0°F | Resp 18 | Ht 73.0 in | Wt 216.8 lb

## 2018-04-15 DIAGNOSIS — I10 Essential (primary) hypertension: Secondary | ICD-10-CM

## 2018-04-15 DIAGNOSIS — M5020 Other cervical disc displacement, unspecified cervical region: Secondary | ICD-10-CM | POA: Diagnosis not present

## 2018-04-15 DIAGNOSIS — E7800 Pure hypercholesterolemia, unspecified: Secondary | ICD-10-CM

## 2018-04-15 DIAGNOSIS — R200 Anesthesia of skin: Secondary | ICD-10-CM

## 2018-04-15 DIAGNOSIS — R634 Abnormal weight loss: Secondary | ICD-10-CM

## 2018-04-15 DIAGNOSIS — N183 Chronic kidney disease, stage 3 unspecified: Secondary | ICD-10-CM

## 2018-04-15 DIAGNOSIS — Z0000 Encounter for general adult medical examination without abnormal findings: Secondary | ICD-10-CM

## 2018-04-15 NOTE — Progress Notes (Signed)
Patient ID: Rodney Benjamin, male   DOB: 03/31/1961, 57 y.o.   MRN: [id number]   Subjective:    Patient ID: Rodney Benjamin, male    DOB: 1961/06/21, 57 y.o.   MRN: [id number]  HPI  Patient here for his physical exam.  Is s/p right modified radical mastoidectomy with meatoplasty by Dr Thelma Barge 02/01/18.  Doing well.  Does have vertigo if he does not wear a cotton ball in his ear.  If wears a cotton ball, no problems.  Continues f/u with ENT.  Has been doing a lot of lifting and moving up and down.  Started noticing some numbness in his left thumb and fingers.  Some discomfort in his neck (left) that extends to his left clavicle.  Soreness worse in the am.  He did fall from a chair recently.  Fell back.  Wants to make sure he did not do anything to injure his neck.  No headache.  No dizziness.  No chest pain.  No sob.  No acid reflux.  No abdominal pain.  Bowels moving.  Has adjusted diet.  Lost weight. States blood pressures averaging 115-120/75-80.     Past Medical History:  Diagnosis Date  . History of chicken pox   . Hypercholesterolemia   . Hypertension    Past Surgical History:  Procedure Laterality Date  . INNER EAR SURGERY     cholesteatoma removed x 2  . KNEE SURGERY  1982   right  . TONSILLECTOMY     Family History  Problem Relation Age of Onset  . Prostate cancer Unknown   . Hypertension Unknown   . Breast cancer Unknown    Social History   Socioeconomic History  . Marital status: Married    Spouse name: Not on file  . Number of children: Not on file  . Years of education: Not on file  . Highest education level: Not on file  Occupational History  . Not on file  Social Needs  . Financial resource strain: Not on file  . Food insecurity:    Worry: Not on file    Inability: Not on file  . Transportation needs:    Medical: Not on file    Non-medical: Not on file  Tobacco Use  . Smoking status: Never Smoker  . Smokeless tobacco: Never Used  Substance and  Sexual Activity  . Alcohol use: No    Alcohol/week: 0.0 standard drinks  . Drug use: No  . Sexual activity: Not on file  Lifestyle  . Physical activity:    Days per week: Not on file    Minutes per session: Not on file  . Stress: Not on file  Relationships  . Social connections:    Talks on phone: Not on file    Gets together: Not on file    Attends religious service: Not on file    Active member of club or organization: Not on file    Attends meetings of clubs or organizations: Not on file    Relationship status: Not on file  Other Topics Concern  . Not on file  Social History Narrative  . Not on file    Outpatient Encounter Medications as of 04/15/2018  Medication Sig  . amLODipine (NORVASC) 5 MG tablet TAKE ONE TABLET BY MOUTH EVERY DAY  . lisinopril (PRINIVIL,ZESTRIL) 5 MG tablet Take by mouth.  . [DISCONTINUED] lisinopril (PRINIVIL,ZESTRIL) 10 MG tablet TAKE 1 TABLET BY MOUTH DAILY   No facility-administered encounter medications on file as  of 04/15/2018.     Review of Systems  Constitutional: Negative for appetite change and unexpected weight change.  HENT: Negative for congestion and sinus pressure.   Eyes: Negative for pain and visual disturbance.  Respiratory: Negative for cough, chest tightness and shortness of breath.   Cardiovascular: Negative for chest pain, palpitations and leg swelling.  Gastrointestinal: Negative for abdominal pain, diarrhea and nausea.  Genitourinary: Negative for difficulty urinating.  Musculoskeletal: Negative for joint swelling and myalgias.       Neck discomfort - extends into his clavicle.    Skin: Negative for color change and rash.  Neurological: Negative for dizziness, light-headedness and headaches.  Hematological: Negative for adenopathy. Does not bruise/bleed easily.  Psychiatric/Behavioral: Negative for agitation and dysphoric mood.       Objective:    Physical Exam  Constitutional: He is oriented to person, place, and  time. He appears well-developed and well-nourished. No distress.  HENT:  Head: Normocephalic and atraumatic.  Nose: Nose normal.  Mouth/Throat: Oropharynx is clear and moist. No oropharyngeal exudate.  Eyes: Conjunctivae are normal. Right eye exhibits no discharge. Left eye exhibits no discharge.  Neck: Neck supple. No thyromegaly present.  Cardiovascular: Normal rate and regular rhythm.  Pulmonary/Chest: Effort normal and breath sounds normal. No respiratory distress. He has no wheezes.  Abdominal: Soft. Bowel sounds are normal. There is no tenderness.  Genitourinary:  Genitourinary Comments: Not performed.    Musculoskeletal: He exhibits no edema or tenderness.  Some increased discomfort - neck - rotation of head from left to right.    Lymphadenopathy:    He has no cervical adenopathy.  Neurological: He is alert and oriented to person, place, and time.  Skin: No rash noted. No erythema.  Psychiatric: He has a normal mood and affect. His behavior is normal.    BP 120/74 (BP Location: Left Arm, Patient Position: Sitting, Cuff Size: Normal)   Pulse 84   Temp 98 F (36.7 C) (Oral)   Resp 18   Ht 6\' 1"  (1.854 m)   Wt 216 lb 12.8 oz (98.3 kg)   SpO2 97%   BMI 28.60 kg/m  Wt Readings from Last 3 Encounters:  04/15/18 216 lb 12.8 oz (98.3 kg)  12/15/17 230 lb (104.3 kg)  07/23/17 225 lb 8 oz (102.3 kg)     Lab Results  Component Value Date   WBC 5.7 12/15/2017   HGB 14.5 12/15/2017   HCT 42.6 12/15/2017   PLT 169.0 12/15/2017   GLUCOSE 99 12/15/2017   CHOL 170 12/15/2017   TRIG 98.0 12/15/2017   HDL 37.20 (L) 12/15/2017   LDLCALC 113 (H) 12/15/2017   ALT 20 12/15/2017   AST 18 12/15/2017   NA 136 12/15/2017   K 4.4 12/15/2017   CL 102 12/15/2017   CREATININE 1.60 (H) 12/15/2017   BUN 21 12/15/2017   CO2 28 12/15/2017   TSH 0.97 04/23/2017   PSA 1.87 12/15/2017       Assessment & Plan:   Problem List Items Addressed This Visit    Cervical disc herniation     Is s/p discectomy and fusion.  Doing a lot of up and down movement and lifting boxes.  Now with some increased discomfort in his neck and some numbness in his thumbs and fingers.  Had recent fall.  Recheck c-spin xray.  May need f/u with Dr Rise Mu.        CKD (chronic kidney disease), stage III (HCC)    Avoid antiinflammatories.  Follow  metabolic panel.        Health care maintenance    Physical today 04/15/18.  Colonoscopy due 08/2020.  Follow psa.       Hypercholesterolemia    Low cholesterol diet and exercise.  Follow lipid panel.       Relevant Medications   lisinopril (PRINIVIL,ZESTRIL) 5 MG tablet   Other Relevant Orders   Lipid panel   Hepatic function panel   Hypertension    Blood pressure under good control.  Continue same medication regimen.  Follow pressures.  Follow metabolic panel.        Relevant Medications   lisinopril (PRINIVIL,ZESTRIL) 5 MG tablet   Other Relevant Orders   TSH   Basic metabolic panel   Left arm numbness    Had left arm numbness prior to his neck surgery.  Some discomfort in his neck and numbness in his thumb and fingers as outlined.  Discussed f/u with Dr Rise Mu.  Given recent fall, recheck c-spine xray.  Further w/up pending results.  Pt wants to start with xray first.        Loss of weight    Has lost weight.  Has adjusted his diet.  Follow.         Other Visit Diagnoses    Routine general medical examination at a health care facility    -  Primary   Finger numbness       Relevant Orders   DG Cervical Spine 2 or 3 views (Completed)       Dale [place], MD

## 2018-04-18 ENCOUNTER — Encounter: Payer: Self-pay | Admitting: Internal Medicine

## 2018-04-18 NOTE — Assessment & Plan Note (Signed)
Physical today 04/15/18.  Colonoscopy due 08/2020.  Follow psa.

## 2018-04-18 NOTE — Assessment & Plan Note (Signed)
Blood pressure under good control.  Continue same medication regimen.  Follow pressures.  Follow metabolic panel.   

## 2018-04-18 NOTE — Assessment & Plan Note (Signed)
Has lost weight.  Has adjusted his diet.  Follow.

## 2018-04-18 NOTE — Assessment & Plan Note (Signed)
Avoid antiinflammatories.  Follow metabolic panel.  

## 2018-04-18 NOTE — Assessment & Plan Note (Signed)
Low cholesterol diet and exercise.  Follow lipid panel.   

## 2018-04-18 NOTE — Assessment & Plan Note (Signed)
Is s/p discectomy and fusion.  Doing a lot of up and down movement and lifting boxes.  Now with some increased discomfort in his neck and some numbness in his thumbs and fingers.  Had recent fall.  Recheck c-spin xray.  May need f/u with Dr Rise Mu.

## 2018-04-18 NOTE — Assessment & Plan Note (Signed)
Had left arm numbness prior to his neck surgery.  Some discomfort in his neck and numbness in his thumb and fingers as outlined.  Discussed f/u with Dr Rise Mu.  Given recent fall, recheck c-spine xray.  Further w/up pending results.  Pt wants to start with xray first.

## 2018-04-19 ENCOUNTER — Encounter: Payer: Self-pay | Admitting: Internal Medicine

## 2018-04-22 ENCOUNTER — Other Ambulatory Visit (INDEPENDENT_AMBULATORY_CARE_PROVIDER_SITE_OTHER): Payer: Managed Care, Other (non HMO)

## 2018-04-22 DIAGNOSIS — E7800 Pure hypercholesterolemia, unspecified: Secondary | ICD-10-CM | POA: Diagnosis not present

## 2018-04-22 DIAGNOSIS — I10 Essential (primary) hypertension: Secondary | ICD-10-CM | POA: Diagnosis not present

## 2018-04-22 LAB — LIPID PANEL
CHOLESTEROL: 153 mg/dL (ref 0–200)
HDL: 36.4 mg/dL — AB (ref >39.00)
LDL Cholesterol: 102 mg/dL — ABNORMAL HIGH (ref 0–99)
NONHDL: 116.4
Total CHOL/HDL Ratio: 4
Triglycerides: 72 mg/dL (ref 0.0–149.0)
VLDL: 14.4 mg/dL (ref 0.0–40.0)

## 2018-04-22 LAB — BASIC METABOLIC PANEL
BUN: 22 mg/dL (ref 6–23)
CALCIUM: 9.3 mg/dL (ref 8.4–10.5)
CO2: 31 meq/L (ref 19–32)
Chloride: 103 mEq/L (ref 96–112)
Creatinine, Ser: 1.6 mg/dL — ABNORMAL HIGH (ref 0.40–1.50)
GFR: 47.44 mL/min — AB (ref >60.00)
GLUCOSE: 95 mg/dL (ref 70–99)
Potassium: 4.5 mEq/L (ref 3.5–5.1)
Sodium: 137 mEq/L (ref 135–145)

## 2018-04-22 LAB — HEPATIC FUNCTION PANEL
ALBUMIN: 4.3 g/dL (ref 3.5–5.2)
ALT: 18 U/L (ref 0–53)
AST: 14 U/L (ref 0–37)
Alkaline Phosphatase: 47 U/L (ref 39–117)
Bilirubin, Direct: 0.1 mg/dL (ref 0.0–0.3)
Total Bilirubin: 0.7 mg/dL (ref 0.2–1.2)
Total Protein: 6.8 g/dL (ref 6.0–8.3)

## 2018-04-22 LAB — TSH: TSH: 1 u[IU]/mL (ref 0.35–4.50)

## 2018-04-28 ENCOUNTER — Encounter: Payer: Self-pay | Admitting: Internal Medicine

## 2018-05-14 ENCOUNTER — Encounter: Payer: Self-pay | Admitting: Internal Medicine

## 2018-05-14 DIAGNOSIS — N183 Chronic kidney disease, stage 3 unspecified: Secondary | ICD-10-CM

## 2018-05-18 NOTE — Telephone Encounter (Signed)
Pt aware and scheduled.

## 2018-05-18 NOTE — Telephone Encounter (Signed)
Called patient to clarify what was needed. I have faxed over his last office note to Amy. He is wanting to come to our office to do the urinalysis they are requiring and also wanted to know if he needed his PSA checked? Advised that he just had it checked 5 months ago but would ask you just to be sure he does not need to have it checked to send to them

## 2018-05-18 NOTE — Telephone Encounter (Signed)
I have placed the order for f/u urinalysis and psa.  Ok to schedule.  His psa was wnl, but did increase some from previous checks.  Will recheck when he comes in for urine test to confirm stable.

## 2018-05-19 ENCOUNTER — Other Ambulatory Visit: Payer: Managed Care, Other (non HMO)

## 2018-05-24 ENCOUNTER — Encounter: Payer: Self-pay | Admitting: Internal Medicine

## 2018-05-24 ENCOUNTER — Other Ambulatory Visit (INDEPENDENT_AMBULATORY_CARE_PROVIDER_SITE_OTHER): Payer: 59

## 2018-05-24 DIAGNOSIS — N183 Chronic kidney disease, stage 3 unspecified: Secondary | ICD-10-CM

## 2018-05-24 LAB — URINALYSIS, ROUTINE W REFLEX MICROSCOPIC
Bilirubin Urine: NEGATIVE
Ketones, ur: NEGATIVE
Leukocytes, UA: NEGATIVE
Nitrite: NEGATIVE
PH: 6.5 (ref 5.0–8.0)
RBC / HPF: NONE SEEN (ref 0–?)
Specific Gravity, Urine: <=1.005 — AB (ref 1.000–1.030)
TOTAL PROTEIN, URINE-UPE24: NEGATIVE
URINE GLUCOSE: NEGATIVE
Urobilinogen, UA: 0.2 (ref 0.0–1.0)

## 2018-05-24 LAB — PSA: PSA: 1.83 ng/mL (ref 0.10–4.00)

## 2018-05-25 ENCOUNTER — Encounter: Payer: Self-pay | Admitting: Internal Medicine

## 2018-07-07 ENCOUNTER — Other Ambulatory Visit: Payer: Self-pay | Admitting: Internal Medicine

## 2018-07-29 ENCOUNTER — Other Ambulatory Visit: Payer: Self-pay | Admitting: Internal Medicine

## 2018-07-29 MED ORDER — OSELTAMIVIR PHOSPHATE 75 MG PO CAPS: 75.0000 mg | ORAL_CAPSULE | Freq: Every day | ORAL | 0 refills | Status: DC

## 2018-07-29 NOTE — Progress Notes (Signed)
Son diagnosed with flu.  Prophylaxis tamiflu sent in.

## 2018-08-16 ENCOUNTER — Ambulatory Visit: Payer: Managed Care, Other (non HMO) | Admitting: Internal Medicine

## 2018-08-20 ENCOUNTER — Ambulatory Visit (INDEPENDENT_AMBULATORY_CARE_PROVIDER_SITE_OTHER): Payer: 59 | Admitting: Internal Medicine

## 2018-08-20 ENCOUNTER — Encounter: Payer: Self-pay | Admitting: Internal Medicine

## 2018-08-20 DIAGNOSIS — N183 Chronic kidney disease, stage 3 unspecified: Secondary | ICD-10-CM

## 2018-08-20 DIAGNOSIS — E7800 Pure hypercholesterolemia, unspecified: Secondary | ICD-10-CM | POA: Diagnosis not present

## 2018-08-20 DIAGNOSIS — I10 Essential (primary) hypertension: Secondary | ICD-10-CM

## 2018-08-20 NOTE — Progress Notes (Signed)
Patient ID: Rodney Benjamin, male   DOB: 06-Apr-1961, 58 y.o.   MRN: [id number]   Subjective:    Patient ID: Rodney Benjamin, male    DOB: 1960/10/19, 58 y.o.   MRN: [id number]  HPI  Patient here for a scheduled follow up.  States he is doing relatively well.  Trying to stay active.  No chest pain.  No sob.  No acid reflux.  No abdominal pain.  Bowels moving.  No urine change.  Blood pressures doing well.  Outside checks averaging 115/70-75.  Handling stress.  No increased cough or congestion.     Past Medical History:  Diagnosis Date  . History of chicken pox   . Hypercholesterolemia   . Hypertension    Past Surgical History:  Procedure Laterality Date  . INNER EAR SURGERY     cholesteatoma removed x 2  . KNEE SURGERY  1982   right  . TONSILLECTOMY     Family History  Problem Relation Age of Onset  . Prostate cancer Other   . Hypertension Other   . Breast cancer Other    Social History   Socioeconomic History  . Marital status: Married    Spouse name: Not on file  . Number of children: Not on file  . Years of education: Not on file  . Highest education level: Not on file  Occupational History  . Not on file  Social Needs  . Financial resource strain: Not on file  . Food insecurity:    Worry: Not on file    Inability: Not on file  . Transportation needs:    Medical: Not on file    Non-medical: Not on file  Tobacco Use  . Smoking status: Never Smoker  . Smokeless tobacco: Never Used  Substance and Sexual Activity  . Alcohol use: No    Alcohol/week: 0.0 standard drinks  . Drug use: No  . Sexual activity: Not on file  Lifestyle  . Physical activity:    Days per week: Not on file    Minutes per session: Not on file  . Stress: Not on file  Relationships  . Social connections:    Talks on phone: Not on file    Gets together: Not on file    Attends religious service: Not on file    Active member of club or organization: Not on file    Attends  meetings of clubs or organizations: Not on file    Relationship status: Not on file  Other Topics Concern  . Not on file  Social History Narrative  . Not on file    Outpatient Encounter Medications as of 08/20/2018  Medication Sig  . amLODipine (NORVASC) 5 MG tablet TAKE ONE TABLET BY MOUTH EVERY DAY  . [DISCONTINUED] lisinopril (PRINIVIL,ZESTRIL) 5 MG tablet Take by mouth.  . [DISCONTINUED] oseltamivir (TAMIFLU) 75 MG capsule Take 1 capsule (75 mg total) by mouth daily.   No facility-administered encounter medications on file as of 08/20/2018.     Review of Systems  Constitutional: Negative for appetite change and unexpected weight change.  HENT: Negative for congestion and sinus pressure.   Respiratory: Negative for cough, chest tightness and shortness of breath.   Cardiovascular: Negative for chest pain, palpitations and leg swelling.  Gastrointestinal: Negative for abdominal pain, diarrhea, nausea and vomiting.  Genitourinary: Negative for difficulty urinating and dysuria.  Musculoskeletal: Negative for joint swelling and myalgias.  Skin: Negative for color change and rash.  Neurological: Negative for dizziness, light-headedness  and headaches.  Psychiatric/Behavioral: Negative for agitation and dysphoric mood.       Objective:    Physical Exam Constitutional:      General: He is not in acute distress.    Appearance: Normal appearance. He is well-developed.  HENT:     Nose: Nose normal. No congestion.     Mouth/Throat:     Pharynx: No oropharyngeal exudate or posterior oropharyngeal erythema.  Cardiovascular:     Rate and Rhythm: Normal rate and regular rhythm.  Pulmonary:     Effort: Pulmonary effort is normal. No respiratory distress.     Breath sounds: Normal breath sounds.  Abdominal:     General: Bowel sounds are normal.     Palpations: Abdomen is soft.     Tenderness: There is no abdominal tenderness.  Musculoskeletal:        General: No swelling or  tenderness.  Skin:    Findings: No erythema or rash.  Neurological:     Mental Status: He is alert.  Psychiatric:        Mood and Affect: Mood normal.        Behavior: Behavior normal.     BP 126/80   Pulse 71   Temp 97.8 F (36.6 C) (Oral)   Resp 16   Wt 219 lb 9.6 oz (99.6 kg)   SpO2 97%   BMI 28.97 kg/m  Wt Readings from Last 3 Encounters:  08/20/18 219 lb 9.6 oz (99.6 kg)  04/15/18 216 lb 12.8 oz (98.3 kg)  12/15/17 230 lb (104.3 kg)     Lab Results  Component Value Date   WBC 5.7 12/15/2017   HGB 14.5 12/15/2017   HCT 42.6 12/15/2017   PLT 169.0 12/15/2017   GLUCOSE 95 04/22/2018   CHOL 153 04/22/2018   TRIG 72.0 04/22/2018   HDL 36.40 (L) 04/22/2018   LDLCALC 102 (H) 04/22/2018   ALT 18 04/22/2018   AST 14 04/22/2018   NA 137 04/22/2018   K 4.5 04/22/2018   CL 103 04/22/2018   CREATININE 1.60 (H) 04/22/2018   BUN 22 04/22/2018   CO2 31 04/22/2018   TSH 1.00 04/22/2018   PSA 1.83 05/24/2018       Assessment & Plan:   Problem List Items Addressed This Visit    CKD (chronic kidney disease), stage III (HCC)    Avoid antiinflammatories.  Follow metabolic panel.        Hypercholesterolemia    Low cholesterol diet and exercise.  Follow lipid panel.       Relevant Orders   Hepatic function panel   Lipid panel   Hypertension    Blood pressure doing well.  Outside checks as outlined.  Will stop lisinopril.  Continue amlodipine.  Follow pressures.  Follow metabolic panel.       Relevant Orders   Basic metabolic panel       Dale [place], MD

## 2018-08-22 ENCOUNTER — Encounter: Payer: Self-pay | Admitting: Internal Medicine

## 2018-08-22 NOTE — Assessment & Plan Note (Signed)
Blood pressure doing well.  Outside checks as outlined.  Will stop lisinopril.  Continue amlodipine.  Follow pressures.  Follow metabolic panel.

## 2018-08-22 NOTE — Assessment & Plan Note (Signed)
Avoid antiinflammatories.  Follow metabolic panel.  

## 2018-08-22 NOTE — Assessment & Plan Note (Signed)
Low cholesterol diet and exercise.  Follow lipid panel.   

## 2018-09-09 ENCOUNTER — Encounter: Payer: Self-pay | Admitting: Internal Medicine

## 2018-09-09 NOTE — Telephone Encounter (Signed)
Spoke with patient to confirm no acute issues. He stated that at his last visit he was told to update because we held one of his medications to see how his pressures did. They started out great but have began to trend back up. He said he does not feel that he needs to be seen but wanted to give you an update and get your opinion.

## 2018-09-17 ENCOUNTER — Other Ambulatory Visit: Payer: 59

## 2018-10-05 ENCOUNTER — Telehealth: Payer: Self-pay

## 2018-10-05 NOTE — Telephone Encounter (Signed)
Copied from CRM [id number]. Topic: Quick Communication - See Telephone Encounter >> Oct 05, 2018  8:27 AM Aretta Nip wrote: CRM for notification. See Telephone encounter for: 10/05/18.[facility] Lab appt  Please call at [phone]. Prefers end of May

## 2018-10-06 ENCOUNTER — Encounter: Payer: Self-pay | Admitting: Internal Medicine

## 2018-10-06 NOTE — Telephone Encounter (Signed)
Pt calling to schedule lab appt.  oders are in from Dr Lorin Picket. Please call him back. No answer at the office.

## 2018-10-06 NOTE — Telephone Encounter (Signed)
Left message asking the patient to call the office. 

## 2018-10-07 ENCOUNTER — Other Ambulatory Visit: Payer: 59

## 2018-10-11 ENCOUNTER — Other Ambulatory Visit: Payer: Self-pay | Admitting: Internal Medicine

## 2018-11-09 ENCOUNTER — Other Ambulatory Visit: Payer: Self-pay

## 2018-11-09 ENCOUNTER — Other Ambulatory Visit (INDEPENDENT_AMBULATORY_CARE_PROVIDER_SITE_OTHER): Payer: 59

## 2018-11-09 DIAGNOSIS — I10 Essential (primary) hypertension: Secondary | ICD-10-CM | POA: Diagnosis not present

## 2018-11-09 DIAGNOSIS — E7800 Pure hypercholesterolemia, unspecified: Secondary | ICD-10-CM | POA: Diagnosis not present

## 2018-11-09 LAB — LIPID PANEL
Cholesterol: 152 mg/dL (ref 0–200)
HDL: 37.7 mg/dL — ABNORMAL LOW (ref >39.00)
LDL Cholesterol: 100 mg/dL — ABNORMAL HIGH (ref 0–99)
NonHDL: 113.9
Total CHOL/HDL Ratio: 4
Triglycerides: 68 mg/dL (ref 0.0–149.0)
VLDL: 13.6 mg/dL (ref 0.0–40.0)

## 2018-11-09 LAB — HEPATIC FUNCTION PANEL
ALT: 13 U/L (ref 0–53)
AST: 14 U/L (ref 0–37)
Albumin: 4.2 g/dL (ref 3.5–5.2)
Alkaline Phosphatase: 54 U/L (ref 39–117)
Bilirubin, Direct: 0.1 mg/dL (ref 0.0–0.3)
Total Bilirubin: 0.8 mg/dL (ref 0.2–1.2)
Total Protein: 7 g/dL (ref 6.0–8.3)

## 2018-11-09 LAB — BASIC METABOLIC PANEL
BUN: 18 mg/dL (ref 6–23)
CO2: 32 mEq/L (ref 19–32)
Calcium: 9 mg/dL (ref 8.4–10.5)
Chloride: 103 mEq/L (ref 96–112)
Creatinine, Ser: 1.57 mg/dL — ABNORMAL HIGH (ref 0.40–1.50)
GFR: 45.53 mL/min — ABNORMAL LOW (ref >60.00)
Glucose, Bld: 92 mg/dL (ref 70–99)
Potassium: 4.3 mEq/L (ref 3.5–5.1)
Sodium: 138 mEq/L (ref 135–145)

## 2018-12-03 ENCOUNTER — Encounter: Payer: Self-pay | Admitting: Internal Medicine

## 2018-12-03 NOTE — Telephone Encounter (Signed)
Please schedule pt for doxy Tuesday.  Thanks

## 2018-12-03 NOTE — Telephone Encounter (Signed)
Lm for pt to call back and schedule appt for Tuesday

## 2018-12-07 ENCOUNTER — Ambulatory Visit (INDEPENDENT_AMBULATORY_CARE_PROVIDER_SITE_OTHER): Payer: 59 | Admitting: Internal Medicine

## 2018-12-07 ENCOUNTER — Encounter: Payer: Self-pay | Admitting: Internal Medicine

## 2018-12-07 ENCOUNTER — Other Ambulatory Visit: Payer: Self-pay

## 2018-12-07 DIAGNOSIS — I10 Essential (primary) hypertension: Secondary | ICD-10-CM | POA: Diagnosis not present

## 2018-12-07 DIAGNOSIS — E7800 Pure hypercholesterolemia, unspecified: Secondary | ICD-10-CM | POA: Diagnosis not present

## 2018-12-07 DIAGNOSIS — N183 Chronic kidney disease, stage 3 unspecified: Secondary | ICD-10-CM

## 2018-12-07 DIAGNOSIS — K649 Unspecified hemorrhoids: Secondary | ICD-10-CM

## 2018-12-07 MED ORDER — HYDROCORT-PRAMOXINE (PERIANAL) 2.5-1 % EX CREA: 1.0000 "application " | TOPICAL_CREAM | Freq: Two times a day (BID) | CUTANEOUS | 0 refills | Status: DC

## 2018-12-07 NOTE — Progress Notes (Signed)
Patient ID: Rodney Benjamin, male   DOB: 11-08-1960, 58 y.o.   MRN: [id number]   Virtual Visit via video Note  This visit type was conducted due to national recommendations for restrictions regarding the COVID-19 pandemic (e.g. social distancing).  This format is felt to be most appropriate for this patient at this time.  All issues noted in this document were discussed and addressed.  No physical exam was performed (except for noted visual exam findings with Video Visits).   I connected with Brecken Corlett by a video enabled telemedicine application and verified that I am speaking with the correct person using two identifiers. Location patient: work Location provider: work Persons participating in the virtual visit: patient, provider  I discussed the limitations, risks, security and privacy concerns of performing an evaluation and management service by video and the availability of in person appointments. The patient expressed understanding and agreed to proceed.  Reason for visit: work in appt.   HPI: Appointment scheduled to discuss recent lab results.  Discussed his cholesterol results and calculated cholesterol risk.  Discussed recommendation to start cholesterol medication.  He wants to hold starting at this time.  Had questions about otc supplements.  Tries to stay active.  No chest pain.  No sob.  No acid reflux.  No abdominal pain.  Bowels moving.  Handling stress.  States blood pressure has been doing well.  Reports blood pressures averaging 120-122/70s.  Off lisinopril.  Only taking amlodipine.  Does report problems with hemorrhoids.  Was working outside and riding tractor.  Feels this aggravated.  Bowels are moving.  No bleeding.  Has had hemorrhoids previously and this feels similar to previous flares.     ROS: See pertinent positives and negatives per HPI.  Past Medical History:  Diagnosis Date  . History of chicken pox   . Hypercholesterolemia   . Hypertension      Past Surgical History:  Procedure Laterality Date  . INNER EAR SURGERY     cholesteatoma removed x 2  . [place]   right  . TONSILLECTOMY      Family History  Problem Relation Age of Onset  . Prostate cancer Other   . Hypertension Other   . Breast cancer Other     SOCIAL HX: reviewed.    Current Outpatient Medications:  .  amLODipine (NORVASC) 5 MG tablet, TAKE 1 TABLET BY MOUTH DAILY, Disp: 30 tablet, Rfl: 3 .  hydrocortisone-pramoxine (ANALPRAM HC) 2.5-1 % rectal cream, Place 1 application rectally 2 (two) times a day., Disp: 30 g, Rfl: 0  EXAM:  GENERAL: alert, oriented, appears well and in no acute distress  HEENT: atraumatic, conjunttiva clear, no obvious abnormalities on inspection of external nose and ears  NECK: normal movements of the head and neck  LUNGS: on inspection no signs of respiratory distress, breathing rate appears normal, no obvious gross SOB, gasping or wheezing  CV: no obvious cyanosis  PSYCH/NEURO: pleasant and cooperative, no obvious depression or anxiety, speech and thought processing grossly intact  ASSESSMENT AND PLAN:  Discussed the following assessment and plan:  CKD (chronic kidney disease), stage III Avoid antiinflammatories.  Follow metabolic panel.    Hypercholesterolemia Discussed calculated cholesterol risk.  Discussed recommendation to start a cholesterol medication.  He wants to hold at this time.  Low cholesterol diet and exercise.  Follow lipid panel.    Hypertension Blood pressure doing well on amlodipine.  Follow pressures.  Follow metabolic panel.    Hemorrhoid Recent  hemorrhoid flare as outlined.  analpram as directed.  Follow.  Notify me if persistent problem.      I discussed the assessment and treatment plan with the patient. The patient was provided an opportunity to ask questions and all were answered. The patient agreed with the plan and demonstrated an understanding of the instructions.   The patient  was advised to call back or seek an in-person evaluation if the symptoms worsen or if the condition fails to improve as anticipated.    Dale Durhamharlene Catherene Kaleta, MD

## 2018-12-09 ENCOUNTER — Encounter: Payer: Self-pay | Admitting: Internal Medicine

## 2018-12-09 DIAGNOSIS — K649 Unspecified hemorrhoids: Secondary | ICD-10-CM | POA: Insufficient documentation

## 2018-12-09 NOTE — Assessment & Plan Note (Signed)
Discussed calculated cholesterol risk.  Discussed recommendation to start a cholesterol medication.  He wants to hold at this time.  Low cholesterol diet and exercise.  Follow lipid panel.

## 2018-12-09 NOTE — Assessment & Plan Note (Signed)
Recent hemorrhoid flare as outlined.  analpram as directed.  Follow.  Notify me if persistent problem.

## 2018-12-09 NOTE — Assessment & Plan Note (Signed)
Avoid antiinflammatories.  Follow metabolic panel.  

## 2018-12-09 NOTE — Assessment & Plan Note (Signed)
Blood pressure doing well on amlodipine.  Follow pressures.  Follow metabolic panel.   

## 2018-12-14 ENCOUNTER — Encounter: Payer: Self-pay | Admitting: Internal Medicine

## 2018-12-27 ENCOUNTER — Ambulatory Visit (INDEPENDENT_AMBULATORY_CARE_PROVIDER_SITE_OTHER): Payer: 59 | Admitting: Internal Medicine

## 2018-12-27 ENCOUNTER — Other Ambulatory Visit: Payer: Self-pay

## 2018-12-27 DIAGNOSIS — K649 Unspecified hemorrhoids: Secondary | ICD-10-CM

## 2018-12-27 DIAGNOSIS — Z125 Encounter for screening for malignant neoplasm of prostate: Secondary | ICD-10-CM

## 2018-12-27 DIAGNOSIS — N183 Chronic kidney disease, stage 3 unspecified: Secondary | ICD-10-CM

## 2018-12-27 DIAGNOSIS — E7800 Pure hypercholesterolemia, unspecified: Secondary | ICD-10-CM

## 2018-12-27 DIAGNOSIS — I10 Essential (primary) hypertension: Secondary | ICD-10-CM

## 2018-12-27 NOTE — Progress Notes (Signed)
Patient ID: Rodney Benjamin, male   DOB: 05-Nov-1960, 58 y.o.   MRN: [id number]   Virtual Visit via video Note  This visit type was conducted due to national recommendations for restrictions regarding the COVID-19 pandemic (e.g. social distancing).  This format is felt to be most appropriate for this patient at this time.  All issues noted in this document were discussed and addressed.  No physical exam was performed (except for noted visual exam findings with Video Visits).   I connected with Rodney Benjamin by a video enabled telemedicine application and verified that I am speaking with the correct person using two identifiers. Location patient: home Location provider: work  Persons participating in the virtual visit: patient, provider  I discussed the limitations, risks, security and privacy concerns of performing an evaluation and management service by video and the availability of in person appointments.  The patient expressed understanding and agreed to proceed.   Reason for visit: scheduled follow up.   HPI: He reports he is doing well.  Feels good.  Stays active.  No chest pain.  No sob.  No acid reflux.  No abdominal pain.  Bowels moving.  Discussed cholesterol.  Discussed calculated cholesterol risk.  He declines.  Wants to follow diet and exercise.  Blood pressure doing well.  Hemorrhoid - better.  No bleeding now.     ROS: See pertinent positives and negatives per HPI.  Past Medical History:  Diagnosis Date  . History of chicken pox   . Hypercholesterolemia   . Hypertension     Past Surgical History:  Procedure Laterality Date  . INNER EAR SURGERY     cholesteatoma removed x 2  . [place]   right  . TONSILLECTOMY      Family History  Problem Relation Age of Onset  . Prostate cancer Other   . Hypertension Other   . Breast cancer Other     SOCIAL HX: reviewed.    Current Outpatient Medications:  .  amLODipine (NORVASC) 5 MG tablet, TAKE 1  TABLET BY MOUTH DAILY, Disp: 30 tablet, Rfl: 3 .  hydrocortisone-pramoxine (ANALPRAM HC) 2.5-1 % rectal cream, Place 1 application rectally 2 (two) times a day., Disp: 30 g, Rfl: 0  EXAM:  VITALS per patient if applicable: 629/52  GENERAL: alert, oriented, appears well and in no acute distress  HEENT: atraumatic, conjunttiva clear, no obvious abnormalities on inspection of external nose and ears  NECK: normal movements of the head and neck  LUNGS: on inspection no signs of respiratory distress, breathing rate appears normal, no obvious gross SOB, gasping or wheezing  CV: no obvious cyanosis  PSYCH/NEURO: pleasant and cooperative, no obvious depression or anxiety, speech and thought processing grossly intact  ASSESSMENT AND PLAN:  Discussed the following assessment and plan:  CKD (chronic kidney disease), stage III Avoid antiinflammatories.  Follow metabolic panel.   Hemorrhoid Better.  Follow.    Hypercholesterolemia Discussed cholesterol results.  Discussed calculated cholesterol risk.  Discussed recommendation to start a cholesterol medication.  He declines.  Wants to work on diet and exercise.  Follow lipid panel.   Hypertension Blood pressure under good control.  Continue same medication regimen.  Follow pressures.  Follow metabolic panel.      I discussed the assessment and treatment plan with the patient. The patient was provided an opportunity to ask questions and all were answered. The patient agreed with the plan and demonstrated an understanding of the instructions.   The patient  was advised to call back or seek an in-person evaluation if the symptoms worsen or if the condition fails to improve as anticipated.   Onda Kattner, MD  

## 2019-01-01 ENCOUNTER — Encounter: Payer: Self-pay | Admitting: Internal Medicine

## 2019-01-01 NOTE — Assessment & Plan Note (Signed)
Discussed cholesterol results.  Discussed calculated cholesterol risk.  Discussed recommendation to start a cholesterol medication.  He declines.  Wants to work on diet and exercise.  Follow lipid panel.

## 2019-01-01 NOTE — Assessment & Plan Note (Signed)
Avoid antiinflammatories.  Follow metabolic panel.  

## 2019-01-01 NOTE — Assessment & Plan Note (Signed)
Better.  Follow.  

## 2019-01-01 NOTE — Assessment & Plan Note (Signed)
Blood pressure under good control.  Continue same medication regimen.  Follow pressures.  Follow metabolic panel.   

## 2019-03-15 ENCOUNTER — Other Ambulatory Visit: Payer: Self-pay | Admitting: Internal Medicine

## 2019-05-26 ENCOUNTER — Other Ambulatory Visit: Payer: Self-pay

## 2019-05-26 DIAGNOSIS — Z20822 Contact with and (suspected) exposure to covid-19: Secondary | ICD-10-CM

## 2019-05-29 LAB — NOVEL CORONAVIRUS, NAA: SARS-CoV-2, NAA: NOT DETECTED

## 2019-06-01 ENCOUNTER — Encounter: Payer: Self-pay | Admitting: Internal Medicine

## 2019-06-01 NOTE — Telephone Encounter (Signed)
Patient is going to keep virtual appt and have labs drawn at the out pt lab prior to appt.

## 2019-06-03 ENCOUNTER — Telehealth: Payer: Self-pay | Admitting: *Deleted

## 2019-06-03 DIAGNOSIS — E7800 Pure hypercholesterolemia, unspecified: Secondary | ICD-10-CM

## 2019-06-03 DIAGNOSIS — Z125 Encounter for screening for malignant neoplasm of prostate: Secondary | ICD-10-CM

## 2019-06-03 DIAGNOSIS — Z20822 Contact with and (suspected) exposure to covid-19: Secondary | ICD-10-CM

## 2019-06-03 DIAGNOSIS — I10 Essential (primary) hypertension: Secondary | ICD-10-CM

## 2019-06-03 NOTE — Telephone Encounter (Signed)
Copied from [place] [id number]. Topic: Appointment Scheduling - Scheduling Inquiry for Clinic >> Jun 03, 2019 11:38 AM Erick Blinks wrote: Reason for CRM: Pt returned call, was unable to connect pt to office due to system errors. Please call back

## 2019-06-06 NOTE — Telephone Encounter (Signed)
Called patient. Discussed having labs done at the hospital. Pt agreed . Going to hospital on 12/15. Appt with pcp 12/17

## 2019-06-07 ENCOUNTER — Other Ambulatory Visit: Payer: 59

## 2019-06-07 ENCOUNTER — Other Ambulatory Visit
Admission: RE | Admit: 2019-06-07 | Discharge: 2019-06-07 | Disposition: A | Discharge status: Home / Self Care | Payer: 59 | Source: Ambulatory Visit | Attending: Internal Medicine | Admitting: Internal Medicine

## 2019-06-07 DIAGNOSIS — E7800 Pure hypercholesterolemia, unspecified: Secondary | ICD-10-CM | POA: Insufficient documentation

## 2019-06-07 DIAGNOSIS — I10 Essential (primary) hypertension: Secondary | ICD-10-CM | POA: Diagnosis not present

## 2019-06-07 DIAGNOSIS — Z20828 Contact with and (suspected) exposure to other viral communicable diseases: Secondary | ICD-10-CM | POA: Diagnosis present

## 2019-06-07 DIAGNOSIS — Z125 Encounter for screening for malignant neoplasm of prostate: Secondary | ICD-10-CM | POA: Diagnosis present

## 2019-06-07 DIAGNOSIS — Z0184 Encounter for antibody response examination: Secondary | ICD-10-CM | POA: Diagnosis not present

## 2019-06-07 DIAGNOSIS — Z20822 Contact with and (suspected) exposure to covid-19: Secondary | ICD-10-CM

## 2019-06-07 LAB — BASIC METABOLIC PANEL
Anion gap: 9 (ref 5–15)
BUN: 19 mg/dL (ref 6–20)
CO2: 28 mmol/L (ref 22–32)
Calcium: 9.1 mg/dL (ref 8.9–10.3)
Chloride: 101 mmol/L (ref 98–111)
Creatinine, Ser: 1.52 mg/dL — ABNORMAL HIGH (ref 0.61–1.24)
GFR calc Af Amer: 57 mL/min — ABNORMAL LOW (ref >60)
GFR calc non Af Amer: 49 mL/min — ABNORMAL LOW (ref >60)
Glucose, Bld: 95 mg/dL (ref 70–99)
Potassium: 4.1 mmol/L (ref 3.5–5.1)
Sodium: 138 mmol/L (ref 135–145)

## 2019-06-07 LAB — PSA: Prostatic Specific Antigen: 1.93 ng/mL (ref 0.00–4.00)

## 2019-06-07 LAB — TSH: TSH: 1.255 u[IU]/mL (ref 0.350–4.500)

## 2019-06-07 LAB — CBC WITH DIFFERENTIAL/PLATELET
Abs Immature Granulocytes: 0.03 10*3/uL (ref 0.00–0.07)
Basophils Absolute: 0 10*3/uL (ref 0.0–0.1)
Basophils Relative: 1 %
Eosinophils Absolute: 0.1 10*3/uL (ref 0.0–0.5)
Eosinophils Relative: 2 %
HCT: 44.6 % (ref 39.0–52.0)
Hemoglobin: 15.2 g/dL (ref 13.0–17.0)
Immature Granulocytes: 1 %
Lymphocytes Relative: 29 %
Lymphs Abs: 1.7 10*3/uL (ref 0.7–4.0)
MCH: 30.9 pg (ref 26.0–34.0)
MCHC: 34.1 g/dL (ref 30.0–36.0)
MCV: 90.7 fL (ref 80.0–100.0)
Monocytes Absolute: 0.4 10*3/uL (ref 0.1–1.0)
Monocytes Relative: 7 %
Neutro Abs: 3.7 10*3/uL (ref 1.7–7.7)
Neutrophils Relative %: 60 %
Platelets: 159 10*3/uL (ref 150–400)
RBC: 4.92 MIL/uL (ref 4.22–5.81)
RDW: 11.9 % (ref 11.5–15.5)
WBC: 6 10*3/uL (ref 4.0–10.5)
nRBC: 0 % (ref 0.0–0.2)

## 2019-06-07 LAB — LIPID PANEL
Cholesterol: 195 mg/dL (ref 0–200)
HDL: 44 mg/dL (ref >40)
LDL Cholesterol: 131 mg/dL — ABNORMAL HIGH (ref 0–99)
Total CHOL/HDL Ratio: 4.4 RATIO
Triglycerides: 102 mg/dL (ref <150)
VLDL: 20 mg/dL (ref 0–40)

## 2019-06-07 LAB — HEPATIC FUNCTION PANEL
ALT: 40 U/L (ref 0–44)
AST: 29 U/L (ref 15–41)
Albumin: 4.4 g/dL (ref 3.5–5.0)
Alkaline Phosphatase: 58 U/L (ref 38–126)
Bilirubin, Direct: 0.1 mg/dL (ref 0.0–0.2)
Indirect Bilirubin: 1.1 mg/dL — ABNORMAL HIGH (ref 0.3–0.9)
Total Bilirubin: 1.2 mg/dL (ref 0.3–1.2)
Total Protein: 7.5 g/dL (ref 6.5–8.1)

## 2019-06-07 LAB — SAR COV2 SEROLOGY (COVID19)AB(IGG),IA: SARS-CoV-2 Ab, IgG: NONREACTIVE

## 2019-06-07 NOTE — Telephone Encounter (Signed)
Lab orders changed to the hospital

## 2019-06-08 LAB — MISC LABCORP TEST (SEND OUT)
Labcorp test code: 164072
Labcorp test code: 164034

## 2019-06-09 ENCOUNTER — Ambulatory Visit (INDEPENDENT_AMBULATORY_CARE_PROVIDER_SITE_OTHER): Payer: 59 | Admitting: Internal Medicine

## 2019-06-09 ENCOUNTER — Other Ambulatory Visit: Payer: Self-pay

## 2019-06-09 VITALS — BP 120/78 | Ht 73.0 in | Wt 217.0 lb

## 2019-06-09 DIAGNOSIS — N1831 Chronic kidney disease, stage 3a: Secondary | ICD-10-CM | POA: Diagnosis not present

## 2019-06-09 DIAGNOSIS — I10 Essential (primary) hypertension: Secondary | ICD-10-CM

## 2019-06-09 DIAGNOSIS — Z125 Encounter for screening for malignant neoplasm of prostate: Secondary | ICD-10-CM | POA: Diagnosis not present

## 2019-06-09 DIAGNOSIS — E7800 Pure hypercholesterolemia, unspecified: Secondary | ICD-10-CM

## 2019-06-09 NOTE — Progress Notes (Signed)
Patient ID: Rodney Benjamin, male   DOB: 1961/03/12, 58 y.o.   MRN: [id number]   Virtual Visit via video Note  This visit type was conducted due to national recommendations for restrictions regarding the COVID-19 pandemic (e.g. social distancing).  This format is felt to be most appropriate for this patient at this time.  All issues noted in this document were discussed and addressed.  No physical exam was performed (except for noted visual exam findings with Video Visits).   I connected with Rodney Benjamin by a video enabled telemedicine application and verified that I am speaking with the correct person using two identifiers. Location patient: home Location provider: work  Persons participating in the virtual visit: patient, provider  I discussed the limitations, risks, security and privacy concerns of performing an evaluation and management service by video and the availability of in person appointments.  The patient expressed understanding and agreed to proceed.   Reason for visit: scheduled follow up.   HPI: He reports he is doing well.  Has been working from home.  Planing to go back into the office to work - next week.  Has been eating differently, being at home.  Plans to adjust his diet.  Discussed his labs.  LDL increased some.  No chest pain or tightness reported.  No sob reported.  No abdominal pain or bowel change reported.  States his blood pressure has been averaging 117-125/77-82.  Overall doing well.  Son recently diagnosed with covid.  Doing well.  Handling stress.  He has had no symptoms.  His covid Ab test negative.    ROS: See pertinent positives and negatives per HPI.  Past Medical History:  Diagnosis Date  . History of chicken pox   . Hypercholesterolemia   . Hypertension     Past Surgical History:  Procedure Laterality Date  . INNER EAR SURGERY     cholesteatoma removed x 2  . [place]   right  . TONSILLECTOMY      Family History    Problem Relation Age of Onset  . Prostate cancer Other   . Hypertension Other   . Breast cancer Other     SOCIAL HX: reviewed.    Current Outpatient Medications:  .  amLODipine (NORVASC) 5 MG tablet, TAKE ONE TABLET EVERY DAY, Disp: 30 tablet, Rfl: 3 .  hydrocortisone-pramoxine (ANALPRAM HC) 2.5-1 % rectal cream, Place 1 application rectally 2 (two) times a day., Disp: 30 g, Rfl: 0  EXAM:  GENERAL: alert, oriented, appears well and in no acute distress  HEENT: atraumatic, conjunttiva clear, no obvious abnormalities on inspection of external nose and ears  NECK: normal movements of the head and neck  LUNGS: on inspection no signs of respiratory distress, breathing rate appears normal, no obvious gross SOB, gasping or wheezing  CV: no obvious cyanosis  PSYCH/NEURO: pleasant and cooperative, no obvious depression or anxiety, speech and thought processing grossly intact  ASSESSMENT AND PLAN:  Discussed the following assessment and plan:  CKD (chronic kidney disease), stage III Avoid antiinflammatories.  Kidney function stable.  Follow met b.   Hypercholesterolemia Low cholesterol diet and exercise.  Discussed calculated cholesterol risk.  Discussed recommendation to start a cholesterol medication.  He declines.  Follow lipid panel.   Hypertension Blood pressure under good control.  Continue same medication regimen.  Follow pressures.  Follow metabolic panel.      I discussed the assessment and treatment plan with the patient. The patient was provided an  opportunity to ask questions and all were answered. The patient agreed with the plan and demonstrated an understanding of the instructions.   The patient was advised to call back or seek an in-person evaluation if the symptoms worsen or if the condition fails to improve as anticipated.   Einar Pheasant, MD

## 2019-06-12 ENCOUNTER — Encounter: Payer: Self-pay | Admitting: Internal Medicine

## 2019-06-12 NOTE — Assessment & Plan Note (Signed)
Blood pressure under good control.  Continue same medication regimen.  Follow pressures.  Follow metabolic panel.   

## 2019-06-12 NOTE — Assessment & Plan Note (Signed)
Avoid antiinflammatories.  Kidney function stable.  Follow met b.

## 2019-06-12 NOTE — Assessment & Plan Note (Signed)
Low cholesterol diet and exercise.  Discussed calculated cholesterol risk.  Discussed recommendation to start a cholesterol medication.  He declines.  Follow lipid panel.

## 2019-06-13 ENCOUNTER — Other Ambulatory Visit: Payer: Self-pay | Admitting: Internal Medicine

## 2019-11-09 ENCOUNTER — Other Ambulatory Visit: Payer: Self-pay | Admitting: Internal Medicine

## 2019-12-08 ENCOUNTER — Telehealth: Payer: Self-pay | Admitting: Internal Medicine

## 2019-12-08 NOTE — Telephone Encounter (Signed)
Pt was returning call 

## 2019-12-08 NOTE — Telephone Encounter (Signed)
I did not call patient. Called and let him know that he has a fasting lab appt and a cpe with Dr Lorin Picket next week. Passed screening to come in at this time.

## 2019-12-13 ENCOUNTER — Other Ambulatory Visit: Payer: Self-pay

## 2019-12-13 ENCOUNTER — Other Ambulatory Visit (INDEPENDENT_AMBULATORY_CARE_PROVIDER_SITE_OTHER): Payer: 59

## 2019-12-13 DIAGNOSIS — I10 Essential (primary) hypertension: Secondary | ICD-10-CM

## 2019-12-13 DIAGNOSIS — E7800 Pure hypercholesterolemia, unspecified: Secondary | ICD-10-CM | POA: Diagnosis not present

## 2019-12-13 DIAGNOSIS — Z125 Encounter for screening for malignant neoplasm of prostate: Secondary | ICD-10-CM | POA: Diagnosis not present

## 2019-12-13 LAB — LIPID PANEL
Cholesterol: 168 mg/dL (ref 0–200)
HDL: 40.1 mg/dL (ref >39.00)
LDL Cholesterol: 114 mg/dL — ABNORMAL HIGH (ref 0–99)
NonHDL: 127.54
Total CHOL/HDL Ratio: 4
Triglycerides: 70 mg/dL (ref 0.0–149.0)
VLDL: 14 mg/dL (ref 0.0–40.0)

## 2019-12-13 LAB — HEPATIC FUNCTION PANEL
ALT: 19 U/L (ref 0–53)
AST: 16 U/L (ref 0–37)
Albumin: 4.3 g/dL (ref 3.5–5.2)
Alkaline Phosphatase: 48 U/L (ref 39–117)
Bilirubin, Direct: 0.1 mg/dL (ref 0.0–0.3)
Total Bilirubin: 0.8 mg/dL (ref 0.2–1.2)
Total Protein: 6.8 g/dL (ref 6.0–8.3)

## 2019-12-13 LAB — BASIC METABOLIC PANEL
BUN: 22 mg/dL (ref 6–23)
CO2: 30 mEq/L (ref 19–32)
Calcium: 9.2 mg/dL (ref 8.4–10.5)
Chloride: 105 mEq/L (ref 96–112)
Creatinine, Ser: 1.38 mg/dL (ref 0.40–1.50)
GFR: 52.64 mL/min — ABNORMAL LOW (ref >60.00)
Glucose, Bld: 93 mg/dL (ref 70–99)
Potassium: 4.2 mEq/L (ref 3.5–5.1)
Sodium: 139 mEq/L (ref 135–145)

## 2019-12-13 LAB — PSA: PSA: 2.35 ng/mL (ref 0.10–4.00)

## 2019-12-15 ENCOUNTER — Ambulatory Visit (INDEPENDENT_AMBULATORY_CARE_PROVIDER_SITE_OTHER): Payer: 59 | Admitting: Internal Medicine

## 2019-12-15 ENCOUNTER — Encounter: Payer: Self-pay | Admitting: Internal Medicine

## 2019-12-15 ENCOUNTER — Other Ambulatory Visit: Payer: Self-pay

## 2019-12-15 VITALS — BP 120/77 | HR 75 | Temp 97.3°F | Resp 16 | Ht 73.0 in | Wt 216.0 lb

## 2019-12-15 DIAGNOSIS — E7800 Pure hypercholesterolemia, unspecified: Secondary | ICD-10-CM

## 2019-12-15 DIAGNOSIS — I10 Essential (primary) hypertension: Secondary | ICD-10-CM | POA: Diagnosis not present

## 2019-12-15 DIAGNOSIS — L989 Disorder of the skin and subcutaneous tissue, unspecified: Secondary | ICD-10-CM

## 2019-12-15 DIAGNOSIS — N1831 Chronic kidney disease, stage 3a: Secondary | ICD-10-CM

## 2019-12-15 DIAGNOSIS — F439 Reaction to severe stress, unspecified: Secondary | ICD-10-CM

## 2019-12-15 DIAGNOSIS — Z125 Encounter for screening for malignant neoplasm of prostate: Secondary | ICD-10-CM

## 2019-12-15 DIAGNOSIS — Z0000 Encounter for general adult medical examination without abnormal findings: Secondary | ICD-10-CM

## 2019-12-15 MED ORDER — CEPHALEXIN 500 MG PO CAPS: 500.0000 mg | ORAL_CAPSULE | Freq: Two times a day (BID) | ORAL | 0 refills | Status: DC

## 2019-12-15 NOTE — Patient Instructions (Signed)
Examples of probiotics:  Florastor, culturelle or align 

## 2019-12-15 NOTE — Assessment & Plan Note (Addendum)
Physical today 12/15/19.  Colonoscopy due 08/2020.  PSA 2.35 12/13/19.

## 2019-12-15 NOTE — Assessment & Plan Note (Addendum)
The 10-year ASCVD risk score Denman George DC Montez Hageman., et al., 2013) is: 11.6%   Values used to calculate the score:     Age: 59 years     Sex: Male     Is Non-Hispanic African American: No     Diabetic: No     Tobacco smoker: No     Systolic Blood Pressure: 146 mmHg     Is BP treated: Yes     HDL Cholesterol: 40.1 mg/dL     Total Cholesterol: 168 mg/dL  Discussed calculated cholesterol risk and recommendation to start cholesterol medication.  Desires not to start at this time.  Low cholesterol diet and exercise. Follow lipid panel.

## 2019-12-15 NOTE — Progress Notes (Signed)
Patient ID: Rodney Benjamin, male   DOB: Jun 29, 1960, 59 y.o.   MRN: [id number]   Subjective:    Patient ID: Rodney Benjamin, male    DOB: 04-24-1961, 59 y.o.   MRN: [id number]  HPI This visit occurred during the SARS-CoV-2 public health emergency.  Safety protocols were in place, including screening questions prior to the visit, additional usage of staff PPE, and extensive cleaning of exam room while observing appropriate contact time as indicated for disinfecting solutions.  Patient here for his physical exam.  He reports he is doing relatively well.  Working.  Handling stress. Son recently married.  Tries to stay active. No chest pain or sob reported.  No acid reflux or abdominal pain reported.  Bowels stable. Blood pressure doing well.  Injured leg two weeks ago on tractor.  Some surrounding redness.     Past Medical History:  Diagnosis Date  . History of chicken pox   . Hypercholesterolemia   . Hypertension    Past Surgical History:  Procedure Laterality Date  . INNER EAR SURGERY     cholesteatoma removed x 2  . KNEE SURGERY  1982   right  . TONSILLECTOMY     Family History  Problem Relation Age of Onset  . Prostate cancer Other   . Hypertension Other   . Breast cancer Other    Social History   Socioeconomic History  . Marital status: Married    Spouse name: Not on file  . Number of children: Not on file  . Years of education: Not on file  . Highest education level: Not on file  Occupational History  . Not on file  Tobacco Use  . Smoking status: Never Smoker  . Smokeless tobacco: Never Used  Substance and Sexual Activity  . Alcohol use: No    Alcohol/week: 0.0 standard drinks  . Drug use: No  . Sexual activity: Not on file  Other Topics Concern  . Not on file  Social History Narrative  . Not on file   Social Determinants of Health   Financial Resource Strain:   . Difficulty of Paying Living Expenses:   Food Insecurity:   . Worried About Community education officer in the Last Year:   . Barista in the Last Year:   Transportation Needs:   . Freight forwarder (Medical):   Marland Kitchen Lack of Transportation (Non-Medical):   Physical Activity:   . Days of Exercise per Week:   . Minutes of Exercise per Session:   Stress:   . Feeling of Stress :   Social Connections:   . Frequency of Communication with Friends and Family:   . Frequency of Social Gatherings with Friends and Family:   . Attends Religious Services:   . Active Member of Clubs or Organizations:   . Attends Banker Meetings:   Marland Kitchen Marital Status:     Outpatient Encounter Medications as of 12/15/2019  Medication Sig  . amLODipine (NORVASC) 5 MG tablet TAKE 1 TABLET BY MOUTH DAILY  . cephALEXin (KEFLEX) 500 MG capsule Take 1 capsule (500 mg total) by mouth 2 (two) times daily.  . hydrocortisone-pramoxine (ANALPRAM HC) 2.5-1 % rectal cream Place 1 application rectally 2 (two) times a day.   No facility-administered encounter medications on file as of 12/15/2019.    Review of Systems  Constitutional: Negative for appetite change and unexpected weight change.  HENT: Negative for congestion and sinus pressure.   Eyes: Negative for  pain and visual disturbance.  Respiratory: Negative for cough, chest tightness and shortness of breath.   Cardiovascular: Negative for chest pain, palpitations and leg swelling.  Gastrointestinal: Negative for abdominal pain, diarrhea, nausea and vomiting.  Genitourinary: Negative for difficulty urinating and dysuria.  Musculoskeletal: Negative for joint swelling and myalgias.  Skin: Negative for rash.       Some erythema left anterior leg.    Neurological: Negative for dizziness, light-headedness and headaches.  Hematological: Negative for adenopathy. Does not bruise/bleed easily.  Psychiatric/Behavioral: Negative for agitation and dysphoric mood.       Objective:    Physical Exam Vitals reviewed.  Constitutional:      General:  He is not in acute distress.    Appearance: Normal appearance. He is well-developed.  HENT:     Head: Normocephalic and atraumatic.     Right Ear: External ear normal.     Left Ear: External ear normal.  Eyes:     General:        Right eye: No discharge.        Left eye: No discharge.     Conjunctiva/sclera: Conjunctivae normal.  Neck:     Thyroid: No thyromegaly.  Cardiovascular:     Rate and Rhythm: Normal rate and regular rhythm.  Pulmonary:     Effort: No respiratory distress.     Breath sounds: Normal breath sounds. No wheezing.  Abdominal:     General: Bowel sounds are normal.     Palpations: Abdomen is soft.     Tenderness: There is no abdominal tenderness.  Genitourinary:    Comments: Not performed.   Musculoskeletal:        General: No tenderness.     Cervical back: Neck supple. No tenderness.  Lymphadenopathy:     Cervical: No cervical adenopathy.  Skin:    Findings: No rash.     Comments: Erythema - left anterior shin.   Neurological:     Mental Status: He is alert and oriented to person, place, and time.  Psychiatric:        Mood and Affect: Mood normal.        Behavior: Behavior normal.     BP 120/77   Pulse 75   Temp (!) 97.3 F (36.3 C)   Resp 16   Ht 6\' 1"  (1.854 m)   Wt 216 lb (98 kg)   SpO2 98%   BMI 28.50 kg/m  Wt Readings from Last 3 Encounters:  12/15/19 216 lb (98 kg)  06/09/19 217 lb (98.4 kg)  08/20/18 219 lb 9.6 oz (99.6 kg)     Lab Results  Component Value Date   WBC 6.0 06/07/2019   HGB 15.2 06/07/2019   HCT 44.6 06/07/2019   PLT 159 06/07/2019   GLUCOSE 93 12/13/2019   CHOL 168 12/13/2019   TRIG 70.0 12/13/2019   HDL 40.10 12/13/2019   LDLCALC 114 (H) 12/13/2019   ALT 19 12/13/2019   AST 16 12/13/2019   NA 139 12/13/2019   K 4.2 12/13/2019   CL 105 12/13/2019   CREATININE 1.38 12/13/2019   BUN 22 12/13/2019   CO2 30 12/13/2019   TSH 1.255 06/07/2019   PSA 2.35 12/13/2019       Assessment & Plan:   Problem  List Items Addressed This Visit    CKD (chronic kidney disease), stage III    GFR improved on recent check - 52.  Avoid antiinflammatories.  Follow metabolic panel.  Health care maintenance    Physical today 12/15/19.  Colonoscopy due 08/2020.  PSA 2.35 12/13/19.        Hypercholesterolemia    The 10-year ASCVD risk score Denman George DC Montez Hageman., et al., 2013) is: 11.6%   Values used to calculate the score:     Age: 26 years     Sex: Male     Is Non-Hispanic African American: No     Diabetic: No     Tobacco smoker: No     Systolic Blood Pressure: 146 mmHg     Is BP treated: Yes     HDL Cholesterol: 40.1 mg/dL     Total Cholesterol: 168 mg/dL  Discussed calculated cholesterol risk and recommendation to start cholesterol medication.  Desires not to start at this time.  Low cholesterol diet and exercise. Follow lipid panel.        Relevant Orders   Hepatic function panel   Lipid panel   Hypertension    Blood pressure on recheck improved.  Continue amlodipine 5mg  q day.  Follow pressures.  Follow metabolic panel.        Relevant Orders   CBC with Differential/Platelet   TSH   Basic metabolic panel   Leg skin lesion, left    Given erythema present, treat with keflex.  Follow.  Call with update.        Stress    Overall appears to be handling things relatively well.  Follow.         Other Visit Diagnoses    Routine general medical examination at a health care facility    -  Primary   Prostate cancer screening       Relevant Orders   PSA       , MD

## 2019-12-16 ENCOUNTER — Encounter: Payer: Self-pay | Admitting: Internal Medicine

## 2019-12-25 ENCOUNTER — Encounter: Payer: Self-pay | Admitting: Internal Medicine

## 2019-12-25 DIAGNOSIS — L989 Disorder of the skin and subcutaneous tissue, unspecified: Secondary | ICD-10-CM | POA: Insufficient documentation

## 2019-12-25 NOTE — Assessment & Plan Note (Signed)
Blood pressure on recheck improved.  Continue amlodipine 5mg  q day.  Follow pressures.  Follow metabolic panel.

## 2019-12-25 NOTE — Assessment & Plan Note (Signed)
Given erythema present, treat with keflex.  Follow.  Call with update.

## 2019-12-25 NOTE — Assessment & Plan Note (Signed)
GFR improved on recent check - 52.  Avoid antiinflammatories.  Follow metabolic panel.

## 2019-12-25 NOTE — Assessment & Plan Note (Signed)
Overall appears to be handling things relatively well.  Follow.   

## 2020-02-09 ENCOUNTER — Other Ambulatory Visit: Payer: Self-pay | Admitting: Internal Medicine

## 2020-05-21 ENCOUNTER — Encounter: Payer: Self-pay | Admitting: Internal Medicine

## 2020-06-07 ENCOUNTER — Other Ambulatory Visit: Payer: 59

## 2020-06-08 ENCOUNTER — Other Ambulatory Visit (INDEPENDENT_AMBULATORY_CARE_PROVIDER_SITE_OTHER): Payer: 59

## 2020-06-08 ENCOUNTER — Other Ambulatory Visit: Payer: Self-pay

## 2020-06-08 DIAGNOSIS — Z125 Encounter for screening for malignant neoplasm of prostate: Secondary | ICD-10-CM | POA: Diagnosis not present

## 2020-06-08 DIAGNOSIS — E7800 Pure hypercholesterolemia, unspecified: Secondary | ICD-10-CM

## 2020-06-08 DIAGNOSIS — I10 Essential (primary) hypertension: Secondary | ICD-10-CM | POA: Diagnosis not present

## 2020-06-08 LAB — CBC WITH DIFFERENTIAL/PLATELET
Basophils Absolute: 0 10*3/uL (ref 0.0–0.1)
Basophils Relative: 0.6 % (ref 0.0–3.0)
Eosinophils Absolute: 0.1 10*3/uL (ref 0.0–0.7)
Eosinophils Relative: 1.4 % (ref 0.0–5.0)
HCT: 42.7 % (ref 39.0–52.0)
Hemoglobin: 14.7 g/dL (ref 13.0–17.0)
Lymphocytes Relative: 32.1 % (ref 12.0–46.0)
Lymphs Abs: 1.6 10*3/uL (ref 0.7–4.0)
MCHC: 34.3 g/dL (ref 30.0–36.0)
MCV: 90.4 fl (ref 78.0–100.0)
Monocytes Absolute: 0.4 10*3/uL (ref 0.1–1.0)
Monocytes Relative: 8.8 % (ref 3.0–12.0)
Neutro Abs: 2.8 10*3/uL (ref 1.4–7.7)
Neutrophils Relative %: 57.1 % (ref 43.0–77.0)
Platelets: 160 10*3/uL (ref 150.0–400.0)
RBC: 4.73 Mil/uL (ref 4.22–5.81)
RDW: 12.4 % (ref 11.5–15.5)
WBC: 4.9 10*3/uL (ref 4.0–10.5)

## 2020-06-08 LAB — BASIC METABOLIC PANEL
BUN: 22 mg/dL (ref 6–23)
CO2: 27 mEq/L (ref 19–32)
Calcium: 9.1 mg/dL (ref 8.4–10.5)
Chloride: 105 mEq/L (ref 96–112)
Creatinine, Ser: 1.53 mg/dL — ABNORMAL HIGH (ref 0.40–1.50)
GFR: 49.28 mL/min — ABNORMAL LOW (ref >60.00)
Glucose, Bld: 86 mg/dL (ref 70–99)
Potassium: 4.2 mEq/L (ref 3.5–5.1)
Sodium: 139 mEq/L (ref 135–145)

## 2020-06-08 LAB — PSA: PSA: 1.75 ng/mL (ref 0.10–4.00)

## 2020-06-08 LAB — TSH: TSH: 1.4 u[IU]/mL (ref 0.35–4.50)

## 2020-06-08 LAB — LIPID PANEL
Cholesterol: 176 mg/dL (ref 0–200)
HDL: 36.8 mg/dL — ABNORMAL LOW (ref >39.00)
LDL Cholesterol: 121 mg/dL — ABNORMAL HIGH (ref 0–99)
NonHDL: 139.5
Total CHOL/HDL Ratio: 5
Triglycerides: 94 mg/dL (ref 0.0–149.0)
VLDL: 18.8 mg/dL (ref 0.0–40.0)

## 2020-06-08 LAB — HEPATIC FUNCTION PANEL
ALT: 27 U/L (ref 0–53)
AST: 19 U/L (ref 0–37)
Albumin: 4.3 g/dL (ref 3.5–5.2)
Alkaline Phosphatase: 49 U/L (ref 39–117)
Bilirubin, Direct: 0.1 mg/dL (ref 0.0–0.3)
Total Bilirubin: 0.9 mg/dL (ref 0.2–1.2)
Total Protein: 7 g/dL (ref 6.0–8.3)

## 2020-06-11 ENCOUNTER — Other Ambulatory Visit: Payer: Self-pay

## 2020-06-11 ENCOUNTER — Encounter: Payer: Self-pay | Admitting: Internal Medicine

## 2020-06-11 ENCOUNTER — Ambulatory Visit (INDEPENDENT_AMBULATORY_CARE_PROVIDER_SITE_OTHER): Payer: 59 | Admitting: Internal Medicine

## 2020-06-11 DIAGNOSIS — I10 Essential (primary) hypertension: Secondary | ICD-10-CM

## 2020-06-11 DIAGNOSIS — E7800 Pure hypercholesterolemia, unspecified: Secondary | ICD-10-CM

## 2020-06-11 DIAGNOSIS — F439 Reaction to severe stress, unspecified: Secondary | ICD-10-CM

## 2020-06-11 DIAGNOSIS — N1831 Chronic kidney disease, stage 3a: Secondary | ICD-10-CM | POA: Diagnosis not present

## 2020-06-11 DIAGNOSIS — M546 Pain in thoracic spine: Secondary | ICD-10-CM

## 2020-06-11 NOTE — Assessment & Plan Note (Addendum)
The 10-year ASCVD risk score Denman George DC Montez Hageman., et al., 2013) is: 12.6%   Values used to calculate the score:     Age: 59 years     Sex: Male     Is Non-Hispanic African American: No     Diabetic: No     Tobacco smoker: No     Systolic Blood Pressure: 138 mmHg     Is BP treated: Yes     HDL Cholesterol: 36.8 mg/dL     Total Cholesterol: 176 mg/dL  Discussed calculated cholesterol risk.  Discussed recommendation to start a cholesterol medication.  He declines.  Will notify me if changes his mind.  Low cholesterol diet and exercise.  Follow lipid panel.

## 2020-06-11 NOTE — Progress Notes (Signed)
Patient ID: Rodney Benjamin, male   DOB: 07-11-1960, 59 y.o.   MRN: [id number]   Subjective:    Patient ID: Rodney Benjamin, male    DOB: 04/19/1961, 59 y.o.   MRN: [id number]  HPI This visit occurred during the SARS-CoV-2 public health emergency.  Safety protocols were in place, including screening questions prior to the visit, additional usage of staff PPE, and extensive cleaning of exam room while observing appropriate contact time as indicated for disinfecting solutions.  Patient here for a scheduled follow up.  He is doing well.  Here to follow up regarding his blood pressure and cholesterol.  Tries to stay active.  Not exercising as much.  No chest pain or sob with increased activity or exertion.  No acid reflux. No abdominal pain or bowel change.  Working.  Has noticed a bump in his ear.  Just noticed yesterday.  No drainage.  No earache.  warm compresses.  Follow.  Previously had flare with sciatica.  Stretches helped.  Resolved now.  Increased stress.  Son just had second kidney transplant.  Doing well.    Past Medical History:  Diagnosis Date  . History of chicken pox   . Hypercholesterolemia   . Hypertension    Past Surgical History:  Procedure Laterality Date  . INNER EAR SURGERY     cholesteatoma removed x 2  . KNEE SURGERY  1982   right  . TONSILLECTOMY     Family History  Problem Relation Age of Onset  . Prostate cancer Other   . Hypertension Other   . Breast cancer Other    Social History   Socioeconomic History  . Marital status: Married    Spouse name: Not on file  . Number of children: Not on file  . Years of education: Not on file  . Highest education level: Not on file  Occupational History  . Not on file  Tobacco Use  . Smoking status: Never Smoker  . Smokeless tobacco: Never Used  Substance and Sexual Activity  . Alcohol use: No    Alcohol/week: 0.0 standard drinks  . Drug use: No  . Sexual activity: Not on file  Other Topics Concern  .  Not on file  Social History Narrative  . Not on file   Social Determinants of Health   Financial Resource Strain: Not on file  Food Insecurity: Not on file  Transportation Needs: Not on file  Physical Activity: Not on file  Stress: Not on file  Social Connections: Not on file    Outpatient Encounter Medications as of 06/11/2020  Medication Sig  . amLODipine (NORVASC) 5 MG tablet TAKE 1 TABLET BY MOUTH DAILY  . [DISCONTINUED] cephALEXin (KEFLEX) 500 MG capsule Take 1 capsule (500 mg total) by mouth 2 (two) times daily.  . [DISCONTINUED] hydrocortisone-pramoxine (ANALPRAM HC) 2.5-1 % rectal cream Place 1 application rectally 2 (two) times a day.   No facility-administered encounter medications on file as of 06/11/2020.    Review of Systems  Constitutional: Negative for appetite change and unexpected weight change.  HENT: Negative for congestion and sinus pressure.   Respiratory: Negative for cough, chest tightness and shortness of breath.   Cardiovascular: Negative for chest pain, palpitations and leg swelling.  Gastrointestinal: Negative for abdominal pain, diarrhea, nausea and vomiting.  Genitourinary: Negative for difficulty urinating and dysuria.  Musculoskeletal: Negative for joint swelling and myalgias.       Previous issues with sciatica.  Doing better now.  Skin: Negative for color change and rash.  Neurological: Negative for dizziness and headaches.  Psychiatric/Behavioral: Negative for agitation and dysphoric mood.       Objective:    Physical Exam Vitals reviewed.  Constitutional:      General: He is not in acute distress.    Appearance: Normal appearance. He is well-developed and well-nourished.  HENT:     Head: Normocephalic and atraumatic.     Right Ear: External ear normal.     Left Ear: External ear normal.  Eyes:     General: No scleral icterus.       Right eye: No discharge.        Left eye: No discharge.     Conjunctiva/sclera: Conjunctivae  normal.  Cardiovascular:     Rate and Rhythm: Normal rate and regular rhythm.  Pulmonary:     Effort: Pulmonary effort is normal. No respiratory distress.     Breath sounds: Normal breath sounds.  Abdominal:     General: Bowel sounds are normal.     Palpations: Abdomen is soft.     Tenderness: There is no abdominal tenderness.  Musculoskeletal:        General: No swelling, tenderness or edema.     Cervical back: Neck supple. No tenderness.  Lymphadenopathy:     Cervical: No cervical adenopathy.  Skin:    Findings: No erythema or rash.  Neurological:     Mental Status: He is alert.  Psychiatric:        Mood and Affect: Mood and affect and mood normal.        Behavior: Behavior normal.     BP 132/78   Pulse 66   Temp (!) 97.5 F (36.4 C) (Oral)   Resp 16   Ht 6\' 1"  (1.854 m)   Wt 222 lb (100.7 kg)   SpO2 98%   BMI 29.29 kg/m  Wt Readings from Last 3 Encounters:  06/11/20 222 lb (100.7 kg)  12/15/19 216 lb (98 kg)  06/09/19 217 lb (98.4 kg)     Lab Results  Component Value Date   WBC 4.9 06/08/2020   HGB 14.7 06/08/2020   HCT 42.7 06/08/2020   PLT 160.0 06/08/2020   GLUCOSE 86 06/08/2020   CHOL 176 06/08/2020   TRIG 94.0 06/08/2020   HDL 36.80 (L) 06/08/2020   LDLCALC 121 (H) 06/08/2020   ALT 27 06/08/2020   AST 19 06/08/2020   NA 139 06/08/2020   K 4.2 06/08/2020   CL 105 06/08/2020   CREATININE 1.53 (H) 06/08/2020   BUN 22 06/08/2020   CO2 27 06/08/2020   TSH 1.40 06/08/2020   PSA 1.75 06/08/2020       Assessment & Plan:   Problem List Items Addressed This Visit    Hypertension    Blood pressure as outlined.  On amlodipine.  His averaging 123/80.  Continue amlodipine.  Follow pressures.  Follow metabolic panel.       Relevant Orders   Basic metabolic panel   Hypercholesterolemia    The 10-year ASCVD risk score 06/10/2020 DC Jr., et al., 2013) is: 12.6%   Values used to calculate the score:     Age: 65 years     Sex: Male     Is Non-Hispanic  African American: No     Diabetic: No     Tobacco smoker: No     Systolic Blood Pressure: 138 mmHg     Is BP treated: Yes     HDL  Cholesterol: 36.8 mg/dL     Total Cholesterol: 176 mg/dL  Discussed calculated cholesterol risk.  Discussed recommendation to start a cholesterol medication.  He declines.  Will notify me if changes his mind.  Low cholesterol diet and exercise.  Follow lipid panel.       Relevant Orders   Hepatic function panel   Lipid panel   Back pain    Had recent flare - sciatica.  Stretches.  Better.  Follow.        Stress    Overall handling things well.  Follow.        CKD (chronic kidney disease), stage III (HCC)    GFR stable on recent check.  One kidney.  Avoid antiinflammatories.  Follow metabolic panel.           Dale Hubbard, MD

## 2020-06-19 ENCOUNTER — Encounter: Payer: Self-pay | Admitting: Internal Medicine

## 2020-06-19 NOTE — Assessment & Plan Note (Signed)
Had recent flare - sciatica.  Stretches.  Better.  Follow.

## 2020-06-19 NOTE — Assessment & Plan Note (Signed)
Blood pressure as outlined.  On amlodipine.  His averaging 123/80.  Continue amlodipine.  Follow pressures.  Follow metabolic panel.

## 2020-06-19 NOTE — Assessment & Plan Note (Signed)
GFR stable on recent check.  One kidney.  Avoid antiinflammatories.  Follow metabolic panel.

## 2020-06-19 NOTE — Assessment & Plan Note (Signed)
Overall handling things well.  Follow.   

## 2020-07-03 ENCOUNTER — Other Ambulatory Visit: Payer: Self-pay | Admitting: Internal Medicine

## 2020-09-26 ENCOUNTER — Other Ambulatory Visit: Payer: Self-pay | Admitting: Internal Medicine

## 2020-12-06 ENCOUNTER — Other Ambulatory Visit: Payer: Self-pay

## 2020-12-06 ENCOUNTER — Other Ambulatory Visit (INDEPENDENT_AMBULATORY_CARE_PROVIDER_SITE_OTHER): Payer: 59

## 2020-12-06 DIAGNOSIS — E7800 Pure hypercholesterolemia, unspecified: Secondary | ICD-10-CM | POA: Diagnosis not present

## 2020-12-06 DIAGNOSIS — I10 Essential (primary) hypertension: Secondary | ICD-10-CM

## 2020-12-06 LAB — BASIC METABOLIC PANEL
BUN: 25 mg/dL — ABNORMAL HIGH (ref 6–23)
CO2: 27 mEq/L (ref 19–32)
Calcium: 9.4 mg/dL (ref 8.4–10.5)
Chloride: 105 mEq/L (ref 96–112)
Creatinine, Ser: 1.53 mg/dL — ABNORMAL HIGH (ref 0.40–1.50)
GFR: 49.11 mL/min — ABNORMAL LOW (ref >60.00)
Glucose, Bld: 95 mg/dL (ref 70–99)
Potassium: 4.1 mEq/L (ref 3.5–5.1)
Sodium: 138 mEq/L (ref 135–145)

## 2020-12-06 LAB — HEPATIC FUNCTION PANEL
ALT: 16 U/L (ref 0–53)
AST: 17 U/L (ref 0–37)
Albumin: 4.4 g/dL (ref 3.5–5.2)
Alkaline Phosphatase: 46 U/L (ref 39–117)
Bilirubin, Direct: 0.2 mg/dL (ref 0.0–0.3)
Total Bilirubin: 0.9 mg/dL (ref 0.2–1.2)
Total Protein: 7.2 g/dL (ref 6.0–8.3)

## 2020-12-06 LAB — LIPID PANEL
Cholesterol: 146 mg/dL (ref 0–200)
HDL: 37.3 mg/dL — ABNORMAL LOW (ref >39.00)
LDL Cholesterol: 96 mg/dL (ref 0–99)
NonHDL: 108.25
Total CHOL/HDL Ratio: 4
Triglycerides: 62 mg/dL (ref 0.0–149.0)
VLDL: 12.4 mg/dL (ref 0.0–40.0)

## 2020-12-10 ENCOUNTER — Encounter: Payer: Self-pay | Admitting: Internal Medicine

## 2020-12-10 ENCOUNTER — Ambulatory Visit (INDEPENDENT_AMBULATORY_CARE_PROVIDER_SITE_OTHER): Payer: 59 | Admitting: Internal Medicine

## 2020-12-10 ENCOUNTER — Other Ambulatory Visit: Payer: Self-pay

## 2020-12-10 VITALS — BP 126/72 | HR 61 | Temp 97.8°F | Resp 16 | Ht 73.0 in | Wt 218.0 lb

## 2020-12-10 DIAGNOSIS — E7800 Pure hypercholesterolemia, unspecified: Secondary | ICD-10-CM | POA: Diagnosis not present

## 2020-12-10 DIAGNOSIS — Z0000 Encounter for general adult medical examination without abnormal findings: Secondary | ICD-10-CM

## 2020-12-10 DIAGNOSIS — N1831 Chronic kidney disease, stage 3a: Secondary | ICD-10-CM

## 2020-12-10 DIAGNOSIS — I10 Essential (primary) hypertension: Secondary | ICD-10-CM

## 2020-12-10 DIAGNOSIS — M546 Pain in thoracic spine: Secondary | ICD-10-CM

## 2020-12-10 DIAGNOSIS — Z1211 Encounter for screening for malignant neoplasm of colon: Secondary | ICD-10-CM

## 2020-12-10 DIAGNOSIS — F439 Reaction to severe stress, unspecified: Secondary | ICD-10-CM

## 2020-12-10 NOTE — Assessment & Plan Note (Addendum)
The 10-year ASCVD risk score Denman George DC Montez Hageman., et al., 2013) is: 9.1%   Values used to calculate the score:     Age: 60 years     Sex: Male     Is Non-Hispanic African American: No     Diabetic: No     Tobacco smoker: No     Systolic Blood Pressure: 126 mmHg     Is BP treated: Yes     HDL Cholesterol: 37.3 mg/dL     Total Cholesterol: 146 mg/dL  Discussed calculated cholesterol risk.  He declines to start cholesterol medication.  Cholesterol improved.  Wants to continue with diet and exercise.  Follow lipid panel.

## 2020-12-10 NOTE — Assessment & Plan Note (Addendum)
Physical today 12/10/20.  Colonoscopy due.  PSA 1.75 - 06/08/20.

## 2020-12-10 NOTE — Progress Notes (Signed)
Patient ID: Rodney Benjamin, male   DOB: 03/17/1961, 60 y.o.   MRN: [id number]   Subjective:    Patient ID: Rodney Benjamin, male    DOB: 17-Oct-1960, 60 y.o.   MRN: [id number]  HPI This visit occurred during the SARS-CoV-2 public health emergency.  Safety protocols were in place, including screening questions prior to the visit, additional usage of staff PPE, and extensive cleaning of exam room while observing appropriate contact time as indicated for disinfecting solutions.   Patient here for his physical exam.  He is doing well.  Feels good.  Stays active.  Exercises.  No chest pain or sob with increased activity or exertion.  No acid reflux or abdominal pain reported.  Bowels moving.  Due colonoscopy. Discussed.  Handling stress.    Past Medical History:  Diagnosis Date   History of chicken pox    Hypercholesterolemia    Hypertension    Past Surgical History:  Procedure Laterality Date   INNER EAR SURGERY     cholesteatoma removed x 2   KNEE SURGERY  1982   right   TONSILLECTOMY     Family History  Problem Relation Age of Onset   Prostate cancer Other    Hypertension Other    Breast cancer Other    Social History   Socioeconomic History   Marital status: Married    Spouse name: Not on file   Number of children: Not on file   Years of education: Not on file   Highest education level: Not on file  Occupational History   Not on file  Tobacco Use   Smoking status: Never   Smokeless tobacco: Never  Substance and Sexual Activity   Alcohol use: No    Alcohol/week: 0.0 standard drinks   Drug use: No   Sexual activity: Not on file  Other Topics Concern   Not on file  Social History Narrative   Not on file   Social Determinants of Health   Financial Resource Strain: Not on file  Food Insecurity: Not on file  Transportation Needs: Not on file  Physical Activity: Not on file  Stress: Not on file  Social Connections: Not on file    Outpatient Encounter  Medications as of 12/10/2020  Medication Sig   amLODipine (NORVASC) 5 MG tablet TAKE 1 TABLET BY MOUTH DAILY   No facility-administered encounter medications on file as of 12/10/2020.     Review of Systems  Constitutional:  Negative for appetite change and unexpected weight change.  HENT:  Negative for congestion, sinus pressure and sore throat.   Eyes:  Negative for pain and visual disturbance.  Respiratory:  Negative for cough, chest tightness and shortness of breath.   Cardiovascular:  Negative for chest pain, palpitations and leg swelling.  Gastrointestinal:  Negative for abdominal pain, diarrhea, nausea and vomiting.  Genitourinary:  Negative for difficulty urinating and dysuria.  Musculoskeletal:  Negative for joint swelling and myalgias.  Skin:  Negative for rash and wound.  Neurological:  Negative for dizziness, light-headedness and headaches.  Hematological:  Negative for adenopathy. Does not bruise/bleed easily.  Psychiatric/Behavioral:  Negative for agitation and dysphoric mood.       Objective:    Physical Exam Constitutional:      General: He is not in acute distress.    Appearance: Normal appearance. He is well-developed.  HENT:     Head: Normocephalic and atraumatic.     Right Ear: External ear normal.     Left  Ear: External ear normal.  Eyes:     General: No scleral icterus.       Right eye: No discharge.        Left eye: No discharge.     Conjunctiva/sclera: Conjunctivae normal.  Neck:     Thyroid: No thyromegaly.  Cardiovascular:     Rate and Rhythm: Normal rate and regular rhythm.  Pulmonary:     Effort: No respiratory distress.     Breath sounds: Normal breath sounds. No wheezing.  Abdominal:     General: Bowel sounds are normal.     Palpations: Abdomen is soft.     Tenderness: There is no abdominal tenderness.  Musculoskeletal:        General: No swelling or tenderness.     Cervical back: Neck supple. No tenderness.  Lymphadenopathy:      Cervical: No cervical adenopathy.  Skin:    Findings: No erythema or rash.  Neurological:     Mental Status: He is alert and oriented to person, place, and time.  Psychiatric:        Mood and Affect: Mood normal.        Behavior: Behavior normal.    BP 126/72   Pulse 61   Temp 97.8 F (36.6 C)   Resp 16   Ht 6\' 1"  (1.854 m)   Wt 218 lb (98.9 kg)   SpO2 99%   BMI 28.76 kg/m  Wt Readings from Last 3 Encounters:  12/10/20 218 lb (98.9 kg)  06/11/20 222 lb (100.7 kg)  12/15/19 216 lb (98 kg)     Lab Results  Component Value Date   WBC 4.9 06/08/2020   HGB 14.7 06/08/2020   HCT 42.7 06/08/2020   PLT 160.0 06/08/2020   GLUCOSE 95 12/06/2020   CHOL 146 12/06/2020   TRIG 62.0 12/06/2020   HDL 37.30 (L) 12/06/2020   LDLCALC 96 12/06/2020   ALT 16 12/06/2020   AST 17 12/06/2020   NA 138 12/06/2020   K 4.1 12/06/2020   CL 105 12/06/2020   CREATININE 1.53 (H) 12/06/2020   BUN 25 (H) 12/06/2020   CO2 27 12/06/2020   TSH 1.40 06/08/2020   PSA 1.75 06/08/2020       Assessment & Plan:   Problem List Items Addressed This Visit     Back pain    No significant issue today.  Follow.        CKD (chronic kidney disease), stage III (HCC)    GFR 49 on recent check.  Has 1 kidney.  Avoid anti-inflammatories.  Stay hydrated.  Follow metabolic panel.       Health care maintenance    Physical today 12/10/20.  Colonoscopy due.  PSA 1.75 - 06/08/20.        Hypercholesterolemia    The 10-year ASCVD risk score 06/10/20 DC Denman George., et al., 2013) is: 9.1%   Values used to calculate the score:     Age: 37 years     Sex: Male     Is Non-Hispanic African American: No     Diabetic: No     Tobacco smoker: No     Systolic Blood Pressure: 126 mmHg     Is BP treated: Yes     HDL Cholesterol: 37.3 mg/dL     Total Cholesterol: 146 mg/dL  Discussed calculated cholesterol risk.  He declines to start cholesterol medication.  Cholesterol improved.  Wants to continue with diet and exercise.   Follow lipid panel.  Hypertension    Blood pressure as outlined.  On amlodipine.   Continue amlodipine.  Follow pressures.  Follow metabolic panel.        Relevant Orders   Basic metabolic panel   Stress    Overall appears to be doing well.  Follow-up.       Other Visit Diagnoses     Routine general medical examination at a health care facility    -  Primary   Colon cancer screening       Relevant Orders   Ambulatory referral to Gastroenterology        Dale [place], MD

## 2020-12-16 ENCOUNTER — Encounter: Payer: Self-pay | Admitting: Internal Medicine

## 2020-12-16 NOTE — Assessment & Plan Note (Signed)
Blood pressure as outlined.  On amlodipine.   Continue amlodipine.  Follow pressures.  Follow metabolic panel.  

## 2020-12-16 NOTE — Assessment & Plan Note (Addendum)
No significant issue today.  Follow.

## 2020-12-16 NOTE — Assessment & Plan Note (Signed)
Overall appears to be doing well.  Follow-up.

## 2020-12-16 NOTE — Assessment & Plan Note (Signed)
GFR 49 on recent check.  Has 1 kidney.  Avoid anti-inflammatories.  Stay hydrated.  Follow metabolic panel.

## 2021-01-23 ENCOUNTER — Other Ambulatory Visit: Payer: Self-pay | Admitting: Internal Medicine

## 2021-03-13 ENCOUNTER — Other Ambulatory Visit: Payer: 59

## 2021-03-21 ENCOUNTER — Other Ambulatory Visit: Payer: 59

## 2021-03-25 ENCOUNTER — Other Ambulatory Visit: Payer: Self-pay

## 2021-03-25 ENCOUNTER — Other Ambulatory Visit (INDEPENDENT_AMBULATORY_CARE_PROVIDER_SITE_OTHER): Payer: 59

## 2021-03-25 DIAGNOSIS — I10 Essential (primary) hypertension: Secondary | ICD-10-CM

## 2021-03-25 LAB — BASIC METABOLIC PANEL
BUN: 15 mg/dL (ref 6–23)
CO2: 28 mEq/L (ref 19–32)
Calcium: 9.4 mg/dL (ref 8.4–10.5)
Chloride: 101 mEq/L (ref 96–112)
Creatinine, Ser: 1.48 mg/dL (ref 0.40–1.50)
GFR: 51 mL/min — ABNORMAL LOW (ref >60.00)
Glucose, Bld: 91 mg/dL (ref 70–99)
Potassium: 4.6 mEq/L (ref 3.5–5.1)
Sodium: 136 mEq/L (ref 135–145)

## 2021-05-14 ENCOUNTER — Other Ambulatory Visit: Payer: Self-pay | Admitting: Internal Medicine

## 2021-06-13 ENCOUNTER — Ambulatory Visit (INDEPENDENT_AMBULATORY_CARE_PROVIDER_SITE_OTHER): Payer: 59 | Admitting: Internal Medicine

## 2021-06-13 ENCOUNTER — Other Ambulatory Visit: Payer: Self-pay

## 2021-06-13 ENCOUNTER — Encounter: Payer: Self-pay | Admitting: Internal Medicine

## 2021-06-13 VITALS — BP 138/80 | HR 69 | Temp 98.1°F | Resp 16 | Ht 73.0 in | Wt 222.2 lb

## 2021-06-13 DIAGNOSIS — Z1211 Encounter for screening for malignant neoplasm of colon: Secondary | ICD-10-CM

## 2021-06-13 DIAGNOSIS — I10 Essential (primary) hypertension: Secondary | ICD-10-CM

## 2021-06-13 DIAGNOSIS — Z125 Encounter for screening for malignant neoplasm of prostate: Secondary | ICD-10-CM

## 2021-06-13 DIAGNOSIS — N1831 Chronic kidney disease, stage 3a: Secondary | ICD-10-CM | POA: Diagnosis not present

## 2021-06-13 DIAGNOSIS — E7800 Pure hypercholesterolemia, unspecified: Secondary | ICD-10-CM | POA: Diagnosis not present

## 2021-06-13 DIAGNOSIS — F439 Reaction to severe stress, unspecified: Secondary | ICD-10-CM

## 2021-06-13 NOTE — Progress Notes (Signed)
Patient ID: Rodney Benjamin, male   DOB: Sep 26, 1960, 60 y.o.   MRN: [id number]   Subjective:    Patient ID: Rodney Benjamin, male    DOB: [birth year], 60 y.o.   MRN: [id number]  This visit occurred during the SARS-CoV-2 public health emergency.  Safety protocols were in place, including screening questions prior to the visit, additional usage of staff PPE, and extensive cleaning of exam room while observing appropriate contact time as indicated for disinfecting solutions.   Patient here for a scheduled follow up.   Chief Complaint  Patient presents with   Hypertension   .   Hypertension Pertinent negatives include no chest pain, headaches, palpitations or shortness of breath.  He is doing well.  Increased stress with work.  Handling things well.  No chest pain or sob reported.  No abdominal pain or bowel change reported.  Had colonoscopy.  Blood pressure this am at home 122/83.  Discussed immunizations.    Past Medical History:  Diagnosis Date   History of chicken pox    Hypercholesterolemia    Hypertension    Past Surgical History:  Procedure Laterality Date   INNER EAR SURGERY     cholesteatoma removed x 2   KNEE SURGERY  1982   right   TONSILLECTOMY     Family History  Problem Relation Age of Onset   Prostate cancer Other    Hypertension Other    Breast cancer Other    Social History   Socioeconomic History   Marital status: Married    Spouse name: Not on file   Number of children: Not on file   Years of education: Not on file   Highest education level: Not on file  Occupational History   Not on file  Tobacco Use   Smoking status: Never   Smokeless tobacco: Never  Substance and Sexual Activity   Alcohol use: No    Alcohol/week: 0.0 standard drinks   Drug use: No   Sexual activity: Not on file  Other Topics Concern   Not on file  Social History Narrative   Not on file   Social Determinants of Health   Financial Resource Strain: Not on file   Food Insecurity: Not on file  Transportation Needs: Not on file  Physical Activity: Not on file  Stress: Not on file  Social Connections: Not on file     Review of Systems  Constitutional:  Negative for appetite change and unexpected weight change.  HENT:  Negative for congestion and sinus pressure.   Respiratory:  Negative for cough, chest tightness and shortness of breath.   Cardiovascular:  Negative for chest pain, palpitations and leg swelling.  Gastrointestinal:  Negative for abdominal pain, diarrhea, nausea and vomiting.  Genitourinary:  Negative for difficulty urinating and dysuria.  Musculoskeletal:  Negative for joint swelling and myalgias.  Skin:  Negative for color change and rash.  Neurological:  Negative for dizziness, light-headedness and headaches.  Psychiatric/Behavioral:  Negative for agitation and dysphoric mood.       Objective:     BP 138/80    Pulse 69    Temp 98.1 F (36.7 C)    Resp 16    Ht 6\' 1"  (1.854 m)    Wt 222 lb 3.2 oz (100.8 kg)    SpO2 98%    BMI 29.32 kg/m  Wt Readings from Last 3 Encounters:  06/13/21 222 lb 3.2 oz (100.8 kg)  12/10/20 218 lb (98.9 kg)  06/11/20 222  lb (100.7 kg)    Physical Exam Vitals reviewed.  Constitutional:      General: He is not in acute distress.    Appearance: Normal appearance. He is well-developed.  HENT:     Head: Normocephalic and atraumatic.     Right Ear: External ear normal.     Left Ear: External ear normal.  Eyes:     General: No scleral icterus.       Right eye: No discharge.        Left eye: No discharge.     Conjunctiva/sclera: Conjunctivae normal.  Cardiovascular:     Rate and Rhythm: Normal rate and regular rhythm.  Pulmonary:     Effort: Pulmonary effort is normal. No respiratory distress.     Breath sounds: Normal breath sounds.  Abdominal:     General: Bowel sounds are normal.     Palpations: Abdomen is soft.     Tenderness: There is no abdominal tenderness.  Musculoskeletal:         General: No swelling or tenderness.     Cervical back: Neck supple. No tenderness.  Lymphadenopathy:     Cervical: No cervical adenopathy.  Skin:    Findings: No erythema or rash.  Neurological:     Mental Status: He is alert.  Psychiatric:        Mood and Affect: Mood normal.        Behavior: Behavior normal.     Outpatient Encounter Medications as of 06/13/2021  Medication Sig   amLODipine (NORVASC) 5 MG tablet TAKE 1 TABLET BY MOUTH DAILY   No facility-administered encounter medications on file as of 06/13/2021.     Lab Results  Component Value Date   WBC 4.9 06/08/2020   HGB 14.7 06/08/2020   HCT 42.7 06/08/2020   PLT 160.0 06/08/2020   GLUCOSE 91 03/25/2021   CHOL 146 12/06/2020   TRIG 62.0 12/06/2020   HDL 37.30 (L) 12/06/2020   LDLCALC 96 12/06/2020   ALT 16 12/06/2020   AST 17 12/06/2020   NA 136 03/25/2021   K 4.6 03/25/2021   CL 101 03/25/2021   CREATININE 1.48 03/25/2021   BUN 15 03/25/2021   CO2 28 03/25/2021   TSH 1.40 06/08/2020   PSA 1.75 06/08/2020       Assessment & Plan:   Problem List Items Addressed This Visit     CKD (chronic kidney disease), stage III ([place])    GFR 51 - 03/2021.  Avoid antiinflammatories.  Follow metabolic panel.       Colon cancer screening    Had colonoscopy this month.  Obtain report.       Hypercholesterolemia - Primary    Have discussed calculated cholesterol risk.  He has desired to hold on starting cholesterol medication.  Low cholesterol diet and exercise.  Follow lipid panel.       Relevant Orders   Hepatic function panel   Lipid panel   Hypertension    Blood pressure as outlined.  On amlodipine.   Continue amlodipine.  Follow pressures.  Follow metabolic panel.       Stress    Appears to be doing well.  Follow.       Other Visit Diagnoses     Essential hypertension       Relevant Orders   CBC with Differential/Platelet   Basic metabolic panel   TSH   Prostate cancer screening        Relevant Orders   PSA  Einar Pheasant, MD

## 2021-06-22 ENCOUNTER — Encounter: Payer: Self-pay | Admitting: Internal Medicine

## 2021-06-22 DIAGNOSIS — Z1211 Encounter for screening for malignant neoplasm of colon: Secondary | ICD-10-CM | POA: Insufficient documentation

## 2021-06-22 NOTE — Assessment & Plan Note (Signed)
Blood pressure as outlined.  On amlodipine.   Continue amlodipine.  Follow pressures.  Follow metabolic panel.  

## 2021-06-22 NOTE — Assessment & Plan Note (Signed)
Had colonoscopy this month.  Obtain report.

## 2021-06-22 NOTE — Assessment & Plan Note (Signed)
GFR 51 - 03/2021.  Avoid antiinflammatories.  Follow metabolic panel.  

## 2021-06-22 NOTE — Assessment & Plan Note (Signed)
Appears to be doing well.  Follow.   

## 2021-06-22 NOTE — Assessment & Plan Note (Signed)
Have discussed calculated cholesterol risk.  He has desired to hold on starting cholesterol medication.  Low cholesterol diet and exercise.  Follow lipid panel.

## 2021-08-01 ENCOUNTER — Other Ambulatory Visit (INDEPENDENT_AMBULATORY_CARE_PROVIDER_SITE_OTHER): Payer: 59

## 2021-08-01 ENCOUNTER — Other Ambulatory Visit: Payer: Self-pay

## 2021-08-01 DIAGNOSIS — Z125 Encounter for screening for malignant neoplasm of prostate: Secondary | ICD-10-CM | POA: Diagnosis not present

## 2021-08-01 DIAGNOSIS — I10 Essential (primary) hypertension: Secondary | ICD-10-CM | POA: Diagnosis not present

## 2021-08-01 DIAGNOSIS — E7800 Pure hypercholesterolemia, unspecified: Secondary | ICD-10-CM | POA: Diagnosis not present

## 2021-08-01 LAB — BASIC METABOLIC PANEL
BUN: 20 mg/dL (ref 6–23)
CO2: 29 mEq/L (ref 19–32)
Calcium: 9.1 mg/dL (ref 8.4–10.5)
Chloride: 108 mEq/L (ref 96–112)
Creatinine, Ser: 1.46 mg/dL (ref 0.40–1.50)
GFR: 51.71 mL/min — ABNORMAL LOW (ref >60.00)
Glucose, Bld: 88 mg/dL (ref 70–99)
Potassium: 4.2 mEq/L (ref 3.5–5.1)
Sodium: 141 mEq/L (ref 135–145)

## 2021-08-01 LAB — CBC WITH DIFFERENTIAL/PLATELET
Basophils Absolute: 0 10*3/uL (ref 0.0–0.1)
Basophils Relative: 0.5 % (ref 0.0–3.0)
Eosinophils Absolute: 0.1 10*3/uL (ref 0.0–0.7)
Eosinophils Relative: 1.6 % (ref 0.0–5.0)
HCT: 42.7 % (ref 39.0–52.0)
Hemoglobin: 14.4 g/dL (ref 13.0–17.0)
Lymphocytes Relative: 33.1 % (ref 12.0–46.0)
Lymphs Abs: 1.5 10*3/uL (ref 0.7–4.0)
MCHC: 33.8 g/dL (ref 30.0–36.0)
MCV: 90.2 fl (ref 78.0–100.0)
Monocytes Absolute: 0.4 10*3/uL (ref 0.1–1.0)
Monocytes Relative: 9.1 % (ref 3.0–12.0)
Neutro Abs: 2.5 10*3/uL (ref 1.4–7.7)
Neutrophils Relative %: 55.7 % (ref 43.0–77.0)
Platelets: 163 10*3/uL (ref 150.0–400.0)
RBC: 4.74 Mil/uL (ref 4.22–5.81)
RDW: 12.4 % (ref 11.5–15.5)
WBC: 4.5 10*3/uL (ref 4.0–10.5)

## 2021-08-01 LAB — LIPID PANEL
Cholesterol: 147 mg/dL (ref 0–200)
HDL: 36.6 mg/dL — ABNORMAL LOW (ref >39.00)
LDL Cholesterol: 95 mg/dL (ref 0–99)
NonHDL: 110.06
Total CHOL/HDL Ratio: 4
Triglycerides: 76 mg/dL (ref 0.0–149.0)
VLDL: 15.2 mg/dL (ref 0.0–40.0)

## 2021-08-01 LAB — HEPATIC FUNCTION PANEL
ALT: 15 U/L (ref 0–53)
AST: 15 U/L (ref 0–37)
Albumin: 4 g/dL (ref 3.5–5.2)
Alkaline Phosphatase: 45 U/L (ref 39–117)
Bilirubin, Direct: 0.1 mg/dL (ref 0.0–0.3)
Total Bilirubin: 0.9 mg/dL (ref 0.2–1.2)
Total Protein: 6.9 g/dL (ref 6.0–8.3)

## 2021-08-01 LAB — TSH: TSH: 1.5 u[IU]/mL (ref 0.35–5.50)

## 2021-08-01 LAB — PSA: PSA: 2.15 ng/mL (ref 0.10–4.00)

## 2021-09-12 ENCOUNTER — Other Ambulatory Visit: Payer: Self-pay | Admitting: Internal Medicine

## 2021-12-11 ENCOUNTER — Encounter: Payer: Self-pay | Admitting: Internal Medicine

## 2021-12-12 ENCOUNTER — Encounter: Payer: Self-pay | Admitting: Internal Medicine

## 2021-12-12 ENCOUNTER — Ambulatory Visit (INDEPENDENT_AMBULATORY_CARE_PROVIDER_SITE_OTHER): Payer: 59 | Admitting: Internal Medicine

## 2021-12-12 VITALS — BP 128/82 | HR 75 | Temp 98.6°F | Resp 15 | Ht 74.0 in | Wt 224.6 lb

## 2021-12-12 DIAGNOSIS — Z125 Encounter for screening for malignant neoplasm of prostate: Secondary | ICD-10-CM | POA: Diagnosis not present

## 2021-12-12 DIAGNOSIS — Z0000 Encounter for general adult medical examination without abnormal findings: Secondary | ICD-10-CM | POA: Diagnosis not present

## 2021-12-12 DIAGNOSIS — I10 Essential (primary) hypertension: Secondary | ICD-10-CM

## 2021-12-12 DIAGNOSIS — E7800 Pure hypercholesterolemia, unspecified: Secondary | ICD-10-CM | POA: Diagnosis not present

## 2021-12-12 DIAGNOSIS — N1831 Chronic kidney disease, stage 3a: Secondary | ICD-10-CM

## 2021-12-12 DIAGNOSIS — F439 Reaction to severe stress, unspecified: Secondary | ICD-10-CM

## 2021-12-12 LAB — BASIC METABOLIC PANEL
BUN: 22 mg/dL (ref 6–23)
CO2: 29 mEq/L (ref 19–32)
Calcium: 9 mg/dL (ref 8.4–10.5)
Chloride: 106 mEq/L (ref 96–112)
Creatinine, Ser: 1.48 mg/dL (ref 0.40–1.50)
GFR: 50.74 mL/min — ABNORMAL LOW (ref >60.00)
Glucose, Bld: 93 mg/dL (ref 70–99)
Potassium: 4.3 mEq/L (ref 3.5–5.1)
Sodium: 137 mEq/L (ref 135–145)

## 2021-12-12 LAB — HEPATIC FUNCTION PANEL
ALT: 20 U/L (ref 0–53)
AST: 18 U/L (ref 0–37)
Albumin: 4 g/dL (ref 3.5–5.2)
Alkaline Phosphatase: 51 U/L (ref 39–117)
Bilirubin, Direct: 0.1 mg/dL (ref 0.0–0.3)
Total Bilirubin: 0.7 mg/dL (ref 0.2–1.2)
Total Protein: 6.7 g/dL (ref 6.0–8.3)

## 2021-12-12 LAB — LIPID PANEL
Cholesterol: 160 mg/dL (ref 0–200)
HDL: 43.1 mg/dL (ref >39.00)
LDL Cholesterol: 106 mg/dL — ABNORMAL HIGH (ref 0–99)
NonHDL: 117.38
Total CHOL/HDL Ratio: 4
Triglycerides: 58 mg/dL (ref 0.0–149.0)
VLDL: 11.6 mg/dL (ref 0.0–40.0)

## 2021-12-12 LAB — PSA: PSA: 1.95 ng/mL (ref 0.10–4.00)

## 2021-12-12 NOTE — Addendum Note (Signed)
Addended by: Charm Barges on: 12/12/2021 07:57 AM   Modules accepted: Orders

## 2021-12-12 NOTE — Progress Notes (Addendum)
Patient ID: ZACHORY MANGUAL, male   DOB: 08/26/1960, 61 y.o.   MRN: [id number]   Subjective:    Patient ID: ANTWIAN SANTAANA, male    DOB: 04-12-1961, 61 y.o.   MRN: [id number]   Patient here for his physical exam.   Chief Complaint  Patient presents with   Annual Exam   .   HPI Saw ENT 07/2021 - removal of debris.  Hearing overall stable.  Some decrease, especially in a crowded room.  Recommended f/u in 6-8 months with audiogram.  He is overall doing well.  Stays active.  Handling stress at work.  No chest pain or sob reported.  No abdominal pain or bowel change reported.    Past Medical History:  Diagnosis Date   History of chicken pox    Hypercholesterolemia    Hypertension    Past Surgical History:  Procedure Laterality Date   INNER EAR SURGERY     cholesteatoma removed x 2   KNEE SURGERY  1982   right   TONSILLECTOMY     Family History  Problem Relation Age of Onset   Prostate cancer Other    Hypertension Other    Breast cancer Other    Social History   Socioeconomic History   Marital status: Married    Spouse name: Not on file   Number of children: Not on file   Years of education: Not on file   Highest education level: Not on file  Occupational History   Not on file  Tobacco Use   Smoking status: Never   Smokeless tobacco: Never  Substance and Sexual Activity   Alcohol use: No    Alcohol/week: 0.0 standard drinks of alcohol   Drug use: No   Sexual activity: Not on file  Other Topics Concern   Not on file  Social History Narrative   Not on file   Social Determinants of Health   Financial Resource Strain: Not on file  Food Insecurity: Not on file  Transportation Needs: Not on file  Physical Activity: Not on file  Stress: Not on file  Social Connections: Not on file     Review of Systems  Constitutional:  Negative for appetite change and unexpected weight change.  HENT:  Negative for congestion, sinus pressure and sore throat.    Eyes:  Negative for pain and visual disturbance.  Respiratory:  Negative for cough, chest tightness and shortness of breath.   Cardiovascular:  Negative for chest pain, palpitations and leg swelling.  Gastrointestinal:  Negative for abdominal pain, diarrhea, nausea and vomiting.  Genitourinary:  Negative for difficulty urinating and dysuria.  Musculoskeletal:  Negative for joint swelling and myalgias.  Skin:  Negative for color change and rash.  Neurological:  Negative for dizziness, light-headedness and headaches.  Hematological:  Negative for adenopathy. Does not bruise/bleed easily.  Psychiatric/Behavioral:  Negative for agitation and dysphoric mood.        Objective:     BP 128/82   Pulse 75   Temp 98.6 F (37 C) (Temporal)   Resp 15   Ht 6\' 2"  (1.88 m)   Wt 224 lb 9.6 oz (101.9 kg)   SpO2 99%   BMI 28.84 kg/m  Wt Readings from Last 3 Encounters:  12/11/21 224 lb 9.6 oz (101.9 kg)  06/13/21 222 lb 3.2 oz (100.8 kg)  12/10/20 218 lb (98.9 kg)    Physical Exam Constitutional:      General: He is not in acute distress.  Appearance: He is well-developed.  HENT:     Head: Normocephalic and atraumatic.     Nose: Nose normal.     Mouth/Throat:     Pharynx: No oropharyngeal exudate.  Eyes:     General:        Right eye: No discharge.        Left eye: No discharge.     Conjunctiva/sclera: Conjunctivae normal.  Neck:     Thyroid: No thyromegaly.  Cardiovascular:     Rate and Rhythm: Normal rate and regular rhythm.  Pulmonary:     Effort: No respiratory distress.     Breath sounds: Normal breath sounds. No wheezing.  Abdominal:     General: Bowel sounds are normal.     Palpations: Abdomen is soft.     Tenderness: There is no abdominal tenderness.  Genitourinary:    Penis: Normal.      Rectum: Normal.  Musculoskeletal:        General: No tenderness.     Cervical back: Neck supple.  Lymphadenopathy:     Cervical: No cervical adenopathy.  Skin:    General:  Skin is dry.     Findings: No rash.  Neurological:     Mental Status: He is alert and oriented to person, place, and time.  Psychiatric:        Behavior: Behavior normal.      Outpatient Encounter Medications as of 12/12/2021  Medication Sig   amLODipine (NORVASC) 5 MG tablet TAKE 1 TABLET BY MOUTH DAILY   No facility-administered encounter medications on file as of 12/12/2021.     Lab Results  Component Value Date   WBC 4.5 08/01/2021   HGB 14.4 08/01/2021   HCT 42.7 08/01/2021   PLT 163.0 08/01/2021   GLUCOSE 88 08/01/2021   CHOL 147 08/01/2021   TRIG 76.0 08/01/2021   HDL 36.60 (L) 08/01/2021   LDLCALC 95 08/01/2021   ALT 15 08/01/2021   AST 15 08/01/2021   NA 141 08/01/2021   K 4.2 08/01/2021   CL 108 08/01/2021   CREATININE 1.46 08/01/2021   BUN 20 08/01/2021   CO2 29 08/01/2021   TSH 1.50 08/01/2021   PSA 2.15 08/01/2021      Assessment & Plan:   Problem List Items Addressed This Visit     CKD (chronic kidney disease), stage III (HCC)    GFR 51 - 03/2021.  Avoid antiinflammatories.  Follow metabolic panel.       Health care maintenance    Physical today 12/12/21.  Had colonoscopy recently.  Need results.  PSA 08/01/21 - 2.15. check psa today.       Hypercholesterolemia    Have discussed calculated cholesterol risk.  He has desired to hold on starting cholesterol medication.  Low cholesterol diet and exercise.  Follow lipid panel. Check lipid panel today.       Relevant Orders   Lipid panel   Hepatic function panel   Hypertension    Blood pressure as outlined.  On amlodipine.   Continue amlodipine.  Follow pressures.  Follow metabolic panel.       Relevant Orders   Basic metabolic panel   Stress    Appears to be doing well.  Follow.       Other Visit Diagnoses     Routine general medical examination at a health care facility    -  Primary   Prostate cancer screening       Relevant Orders   PSA  Stephenson Cichy, MD  

## 2021-12-12 NOTE — Assessment & Plan Note (Addendum)
Physical today 12/12/21.  Had colonoscopy recently.  Need results.  PSA 08/01/21 - 2.15. check psa today.

## 2021-12-12 NOTE — Assessment & Plan Note (Signed)
GFR 51 - 03/2021.  Avoid antiinflammatories.  Follow metabolic panel.

## 2021-12-12 NOTE — Assessment & Plan Note (Signed)
Have discussed calculated cholesterol risk.  He has desired to hold on starting cholesterol medication.  Low cholesterol diet and exercise.  Follow lipid panel. Check lipid panel today.  

## 2021-12-12 NOTE — Assessment & Plan Note (Signed)
Appears to be doing well.  Follow.   

## 2022-02-08 ENCOUNTER — Other Ambulatory Visit: Payer: Self-pay | Admitting: Internal Medicine

## 2022-05-07 ENCOUNTER — Other Ambulatory Visit: Payer: Self-pay | Admitting: Family

## 2022-06-13 ENCOUNTER — Encounter: Payer: Self-pay | Admitting: Internal Medicine

## 2022-06-13 ENCOUNTER — Ambulatory Visit (INDEPENDENT_AMBULATORY_CARE_PROVIDER_SITE_OTHER): Payer: 59 | Admitting: Internal Medicine

## 2022-06-13 VITALS — BP 124/80 | HR 65 | Temp 98.3°F | Resp 16 | Ht 73.0 in | Wt 229.0 lb

## 2022-06-13 DIAGNOSIS — I10 Essential (primary) hypertension: Secondary | ICD-10-CM

## 2022-06-13 DIAGNOSIS — E7800 Pure hypercholesterolemia, unspecified: Secondary | ICD-10-CM

## 2022-06-13 DIAGNOSIS — N1831 Chronic kidney disease, stage 3a: Secondary | ICD-10-CM | POA: Diagnosis not present

## 2022-06-13 MED ORDER — AMLODIPINE BESYLATE 5 MG PO TABS: 5.0000 mg | ORAL_TABLET | Freq: Every day | ORAL | 3 refills | Status: DC

## 2022-06-13 MED ORDER — METRONIDAZOLE 1 % EX GEL: Freq: Every day | CUTANEOUS | 0 refills | Status: DC

## 2022-06-13 NOTE — Progress Notes (Unsigned)
Subjective:    Patient ID: Rodney Benjamin, male    DOB: 1961/06/21, 61 y.o.   MRN: [id number]  Patient here for  Chief Complaint  Patient presents with   Medical Management of Chronic Issues   Hyperlipidemia   Hypertension    HPI Here to follow up regarding his blood pressure and cholesterol.  Doing well.  Feels good.  Stays active.  No chest pain or sob reported.  No abdominal pain.  Bowels moving. Blood pressures (outside checks - 115/78 and 125/82).     Past Medical History:  Diagnosis Date   History of chicken pox    Hypercholesterolemia    Hypertension    Past Surgical History:  Procedure Laterality Date   INNER EAR SURGERY     cholesteatoma removed x 2   KNEE SURGERY  1982   right   TONSILLECTOMY     Family History  Problem Relation Age of Onset   Prostate cancer Other    Hypertension Other    Breast cancer Other    Social History   Socioeconomic History   Marital status: Married    Spouse name: Not on file   Number of children: Not on file   Years of education: Not on file   Highest education level: Not on file  Occupational History   Not on file  Tobacco Use   Smoking status: Never   Smokeless tobacco: Never  Substance and Sexual Activity   Alcohol use: No    Alcohol/week: 0.0 standard drinks of alcohol   Drug use: No   Sexual activity: Not on file  Other Topics Concern   Not on file  Social History Narrative   Not on file   Social Determinants of Health   Financial Resource Strain: Not on file  Food Insecurity: Not on file  Transportation Needs: Not on file  Physical Activity: Not on file  Stress: Not on file  Social Connections: Not on file     Review of Systems  Constitutional:  Negative for appetite change and unexpected weight change.  HENT:  Negative for congestion and sinus pressure.   Respiratory:  Negative for cough, chest tightness and shortness of breath.   Cardiovascular:  Negative for chest pain, palpitations and  leg swelling.  Gastrointestinal:  Negative for abdominal pain, diarrhea, nausea and vomiting.  Genitourinary:  Negative for difficulty urinating and dysuria.  Musculoskeletal:  Negative for joint swelling and myalgias.  Skin:  Negative for color change and rash.  Neurological:  Negative for dizziness and headaches.  Psychiatric/Behavioral:  Negative for agitation and dysphoric mood.        Objective:     BP 124/80 (BP Location: Left Arm, Patient Position: Sitting, Cuff Size: Large)   Pulse 65   Temp 98.3 F (36.8 C) (Temporal)   Resp 16   Ht 6\' 1"  (1.854 m)   Wt 229 lb (103.9 kg)   SpO2 100%   BMI 30.21 kg/m  Wt Readings from Last 3 Encounters:  06/13/22 229 lb (103.9 kg)  12/11/21 224 lb 9.6 oz (101.9 kg)  06/13/21 222 lb 3.2 oz (100.8 kg)    Physical Exam Vitals reviewed.  Constitutional:      General: He is not in acute distress.    Appearance: Normal appearance. He is well-developed.  HENT:     Head: Normocephalic and atraumatic.     Right Ear: External ear normal.     Left Ear: External ear normal.  Eyes:  General: No scleral icterus.       Right eye: No discharge.        Left eye: No discharge.     Conjunctiva/sclera: Conjunctivae normal.  Cardiovascular:     Rate and Rhythm: Normal rate and regular rhythm.  Pulmonary:     Effort: Pulmonary effort is normal. No respiratory distress.     Breath sounds: Normal breath sounds.  Abdominal:     General: Bowel sounds are normal.     Palpations: Abdomen is soft.     Tenderness: There is no abdominal tenderness.  Musculoskeletal:        General: No swelling or tenderness.     Cervical back: Neck supple. No tenderness.  Lymphadenopathy:     Cervical: No cervical adenopathy.  Skin:    Findings: No erythema or rash.  Neurological:     Mental Status: He is alert.  Psychiatric:        Mood and Affect: Mood normal.        Behavior: Behavior normal.      Outpatient Encounter Medications as of 06/13/2022   Medication Sig   metroNIDAZOLE (METROGEL) 1 % gel Apply topically daily.   [DISCONTINUED] amLODipine (NORVASC) 5 MG tablet TAKE 1 TABLET BY MOUTH DAILY   amLODipine (NORVASC) 5 MG tablet Take 1 tablet (5 mg total) by mouth daily.   No facility-administered encounter medications on file as of 06/13/2022.     Lab Results  Component Value Date   WBC 4.5 08/01/2021   HGB 14.4 08/01/2021   HCT 42.7 08/01/2021   PLT 163.0 08/01/2021   GLUCOSE 93 12/12/2021   CHOL 160 12/12/2021   TRIG 58.0 12/12/2021   HDL 43.10 12/12/2021   LDLCALC 106 (H) 12/12/2021   ALT 20 12/12/2021   AST 18 12/12/2021   NA 137 12/12/2021   K 4.3 12/12/2021   CL 106 12/12/2021   CREATININE 1.48 12/12/2021   BUN 22 12/12/2021   CO2 29 12/12/2021   TSH 1.50 08/01/2021   PSA 1.95 12/12/2021    No results found.     Assessment & Plan:  Primary hypertension Assessment & Plan: Blood pressure as outlined.  On amlodipine.   Continue amlodipine.  Follow pressures.  Follow metabolic panel.   Orders: -     Basic metabolic panel; Future  Hypercholesterolemia Assessment & Plan: Have discussed calculated cholesterol risk.  He has desired to hold on starting cholesterol medication.  Low cholesterol diet and exercise.  Follow lipid panel. Check lipid panel today.   Orders: -     Lipid panel; Future -     Hepatic function panel; Future -     CBC with Differential/Platelet; Future -     TSH; Future  Stage 3a chronic kidney disease (HCC) Assessment & Plan: Stay hydrated. Avoid antiinflammatories.  Follow metabolic panel.    Other orders -     amLODIPine Besylate; Take 1 tablet (5 mg total) by mouth daily.  Dispense: 90 tablet; Refill: 3 -     metroNIDAZOLE; Apply topically daily.  Dispense: 45 g; Refill: 0     Dale Fidelity, MD

## 2022-06-16 ENCOUNTER — Encounter: Payer: Self-pay | Admitting: Internal Medicine

## 2022-06-16 NOTE — Assessment & Plan Note (Signed)
Stay hydrated.  Avoid antiinflammatories.  Follow metabolic panel.   

## 2022-06-16 NOTE — Assessment & Plan Note (Signed)
Have discussed calculated cholesterol risk.  He has desired to hold on starting cholesterol medication.  Low cholesterol diet and exercise.  Follow lipid panel. Check lipid panel today.  

## 2022-06-16 NOTE — Assessment & Plan Note (Signed)
Blood pressure as outlined.  On amlodipine.   Continue amlodipine.  Follow pressures.  Follow metabolic panel.

## 2022-06-24 ENCOUNTER — Encounter: Payer: Self-pay | Admitting: Internal Medicine

## 2022-06-26 ENCOUNTER — Encounter: Payer: Self-pay | Admitting: Nurse Practitioner

## 2022-06-26 ENCOUNTER — Ambulatory Visit (INDEPENDENT_AMBULATORY_CARE_PROVIDER_SITE_OTHER): Payer: 59 | Admitting: Nurse Practitioner

## 2022-06-26 VITALS — BP 126/80 | HR 75 | Temp 98.6°F | Ht 73.0 in | Wt 223.6 lb

## 2022-06-26 DIAGNOSIS — S70261A Insect bite (nonvenomous), right hip, initial encounter: Secondary | ICD-10-CM | POA: Insufficient documentation

## 2022-06-26 DIAGNOSIS — W57XXXA Bitten or stung by nonvenomous insect and other nonvenomous arthropods, initial encounter: Secondary | ICD-10-CM | POA: Diagnosis not present

## 2022-06-26 NOTE — Progress Notes (Signed)
  Tomasita Morrow, NP-C Phone: [phone]  Rodney Benjamin is a 62 y.o. male who presents today for tick bite  Patient reports working outside on Saturday where he believes he was bitten by a tick on his right hip/flank. He noticed the tick on Tuesday. He was able to remove it with no complications. He reports mild redness and inflammation when it was first removed however it had resolved the next day. He denies a "bull's-eye" like rash. Denies fever, chills, fatigue, joint pain, muscle pain, and headache.   Social History   Tobacco Use  Smoking Status Never  Smokeless Tobacco Never    Current Outpatient Medications on File Prior to Visit  Medication Sig Dispense Refill   amLODipine (NORVASC) 5 MG tablet Take 1 tablet (5 mg total) by mouth daily. 90 tablet 3   metroNIDAZOLE (METROGEL) 1 % gel Apply topically daily. 45 g 0   No current facility-administered medications on file prior to visit.     ROS see history of present illness  Objective  Physical Exam Vitals:   06/26/22 1608  BP: 126/80  Pulse: 75  Temp: 98.6 F (37 C)  SpO2: 99%    BP Readings from Last 3 Encounters:  06/26/22 126/80  06/13/22 124/80  12/12/21 128/82   Wt Readings from Last 3 Encounters:  06/26/22 223 lb 9.6 oz (101.4 kg)  06/13/22 229 lb (103.9 kg)  12/11/21 224 lb 9.6 oz (101.9 kg)    Physical Exam Constitutional:      General: He is not in acute distress.    Appearance: Normal appearance.  HENT:     Head: Normocephalic.  Cardiovascular:     Rate and Rhythm: Normal rate and regular rhythm.     Heart sounds: Normal heart sounds.  Pulmonary:     Effort: Pulmonary effort is normal.     Breath sounds: Normal breath sounds.  Skin:    General: Skin is warm and dry.     Findings: No erythema or rash.          Comments: Small abrasion noted on right hip/flank. No erythema or inflammation noted.   Neurological:     Mental Status: He is alert.  Psychiatric:        Mood and Affect:  Mood normal.        Behavior: Behavior normal.      Assessment/Plan: Please see individual problem list.  Tick bite of right hip, initial encounter Assessment & Plan: Discussed with patient that a rash could take anywhere from 3-30 days to appear, at this point he does not have one. No erythema or inflammation noted on exam. Advised to monitor for flu like symptoms and return to care of they start to appear. He does not want to take antibiotics unless he has to. Encouraged him to monitor the area closely, keep clean and dry and let me know if his symptoms change or a rash develops.      Return if symptoms worsen or fail to improve.   Tomasita Morrow, NP-C [facility]

## 2022-06-26 NOTE — Assessment & Plan Note (Addendum)
Discussed with patient that a rash could take anywhere from 3-30 days to appear, at this point he does not have one. No erythema or inflammation noted on exam. Advised to monitor for flu like symptoms and return to care of they start to appear. He does not want to take antibiotics unless he has to. Encouraged him to monitor the area closely, keep clean and dry and let me know if his symptoms change or a rash develops.

## 2022-07-18 ENCOUNTER — Other Ambulatory Visit (INDEPENDENT_AMBULATORY_CARE_PROVIDER_SITE_OTHER): Payer: 59

## 2022-07-18 DIAGNOSIS — E7800 Pure hypercholesterolemia, unspecified: Secondary | ICD-10-CM | POA: Diagnosis not present

## 2022-07-18 DIAGNOSIS — I10 Essential (primary) hypertension: Secondary | ICD-10-CM

## 2022-07-18 LAB — CBC WITH DIFFERENTIAL/PLATELET
Basophils Absolute: 0 10*3/uL (ref 0.0–0.1)
Basophils Relative: 0.4 % (ref 0.0–3.0)
Eosinophils Absolute: 0 10*3/uL (ref 0.0–0.7)
Eosinophils Relative: 0.8 % (ref 0.0–5.0)
HCT: 43.2 % (ref 39.0–52.0)
Hemoglobin: 14.8 g/dL (ref 13.0–17.0)
Lymphocytes Relative: 25.6 % (ref 12.0–46.0)
Lymphs Abs: 1.5 10*3/uL (ref 0.7–4.0)
MCHC: 34.3 g/dL (ref 30.0–36.0)
MCV: 90.7 fl (ref 78.0–100.0)
Monocytes Absolute: 0.5 10*3/uL (ref 0.1–1.0)
Monocytes Relative: 9.1 % (ref 3.0–12.0)
Neutro Abs: 3.7 10*3/uL (ref 1.4–7.7)
Neutrophils Relative %: 64.1 % (ref 43.0–77.0)
Platelets: 163 10*3/uL (ref 150.0–400.0)
RBC: 4.76 Mil/uL (ref 4.22–5.81)
RDW: 12.6 % (ref 11.5–15.5)
WBC: 5.8 10*3/uL (ref 4.0–10.5)

## 2022-07-18 LAB — TSH: TSH: 1.31 u[IU]/mL (ref 0.35–5.50)

## 2022-07-18 LAB — LIPID PANEL
Cholesterol: 145 mg/dL (ref 0–200)
HDL: 35.8 mg/dL — ABNORMAL LOW (ref >39.00)
LDL Cholesterol: 94 mg/dL (ref 0–99)
NonHDL: 109.38
Total CHOL/HDL Ratio: 4
Triglycerides: 76 mg/dL (ref 0.0–149.0)
VLDL: 15.2 mg/dL (ref 0.0–40.0)

## 2022-07-18 LAB — BASIC METABOLIC PANEL
BUN: 19 mg/dL (ref 6–23)
CO2: 28 mEq/L (ref 19–32)
Calcium: 9 mg/dL (ref 8.4–10.5)
Chloride: 104 mEq/L (ref 96–112)
Creatinine, Ser: 1.36 mg/dL (ref 0.40–1.50)
GFR: 55.92 mL/min — ABNORMAL LOW (ref >60.00)
Glucose, Bld: 95 mg/dL (ref 70–99)
Potassium: 4.1 mEq/L (ref 3.5–5.1)
Sodium: 138 mEq/L (ref 135–145)

## 2022-07-18 LAB — HEPATIC FUNCTION PANEL
ALT: 26 U/L (ref 0–53)
AST: 19 U/L (ref 0–37)
Albumin: 4.1 g/dL (ref 3.5–5.2)
Alkaline Phosphatase: 51 U/L (ref 39–117)
Bilirubin, Direct: 0.1 mg/dL (ref 0.0–0.3)
Total Bilirubin: 0.8 mg/dL (ref 0.2–1.2)
Total Protein: 7 g/dL (ref 6.0–8.3)

## 2022-08-19 ENCOUNTER — Encounter: Payer: Self-pay | Admitting: Internal Medicine

## 2022-08-19 DIAGNOSIS — R519 Headache, unspecified: Secondary | ICD-10-CM

## 2022-08-19 DIAGNOSIS — Z8669 Personal history of other diseases of the nervous system and sense organs: Secondary | ICD-10-CM

## 2022-08-22 ENCOUNTER — Encounter: Payer: Self-pay | Admitting: Internal Medicine

## 2022-08-22 ENCOUNTER — Ambulatory Visit (INDEPENDENT_AMBULATORY_CARE_PROVIDER_SITE_OTHER): Payer: 59

## 2022-08-22 ENCOUNTER — Ambulatory Visit (INDEPENDENT_AMBULATORY_CARE_PROVIDER_SITE_OTHER): Payer: 59 | Admitting: Internal Medicine

## 2022-08-22 VITALS — BP 128/78 | HR 85 | Temp 98.2°F | Resp 16 | Ht 73.0 in | Wt 224.0 lb

## 2022-08-22 DIAGNOSIS — M542 Cervicalgia: Secondary | ICD-10-CM

## 2022-08-22 DIAGNOSIS — E7800 Pure hypercholesterolemia, unspecified: Secondary | ICD-10-CM | POA: Diagnosis not present

## 2022-08-22 DIAGNOSIS — I10 Essential (primary) hypertension: Secondary | ICD-10-CM | POA: Diagnosis not present

## 2022-08-22 DIAGNOSIS — N1831 Chronic kidney disease, stage 3a: Secondary | ICD-10-CM

## 2022-08-22 MED ORDER — TIZANIDINE HCL 4 MG PO TABS: 4.0000 mg | ORAL_TABLET | Freq: Every evening | ORAL | 0 refills | Status: DC | PRN

## 2022-08-22 NOTE — Progress Notes (Unsigned)
Subjective:    Patient ID: Rodney Benjamin, male    DOB: Feb 15, 1961, 62 y.o.   MRN: [id number]  Patient here for  Chief Complaint  Patient presents with   Neck Pain    Tingling feeling in right side of neck radiating to top of head for 3-4 wks    HPI Here as a work in - neck soreness.  Has a history of right cholesteatoma s/p modified radical mastoidectomy with meatoplasty in 2019 and revision tympanomastoidectomy in 2020 with TORP and cartilage graft.  He was seen by ENT 08/13/22 for right-sided aural fullness. He had noted fullness and sense of pressure in the right ear for one week prior.  This is how his ear felt before he had his initial surgery.  Ear was cleaned and evaluated.  Per review, felt to be more myofascial.  Recommended continued massage and referral to PT.    Past Medical History:  Diagnosis Date   History of chicken pox    Hypercholesterolemia    Hypertension    Past Surgical History:  Procedure Laterality Date   INNER EAR SURGERY     cholesteatoma removed x 2   KNEE SURGERY  1982   right   TONSILLECTOMY     Family History  Problem Relation Age of Onset   Prostate cancer Other    Hypertension Other    Breast cancer Other    Social History   Socioeconomic History   Marital status: Married    Spouse name: Not on file   Number of children: Not on file   Years of education: Not on file   Highest education level: Not on file  Occupational History   Not on file  Tobacco Use   Smoking status: Never   Smokeless tobacco: Never  Substance and Sexual Activity   Alcohol use: No    Alcohol/week: 0.0 standard drinks of alcohol   Drug use: No   Sexual activity: Not on file  Other Topics Concern   Not on file  Social History Narrative   Not on file   Social Determinants of Health   Financial Resource Strain: Not on file  Food Insecurity: Not on file  Transportation Needs: Not on file  Physical Activity: Not on file  Stress: Not on file  Social  Connections: Not on file     Review of Systems     Objective:     BP 128/78   Pulse 85   Temp 98.2 F (36.8 C)   Resp 16   Ht '6\' 1"'$  (1.854 m)   Wt 224 lb (101.6 kg)   SpO2 98%   BMI 29.55 kg/m  Wt Readings from Last 3 Encounters:  08/22/22 224 lb (101.6 kg)  06/26/22 223 lb 9.6 oz (101.4 kg)  06/13/22 229 lb (103.9 kg)    Physical Exam   Outpatient Encounter Medications as of 08/22/2022  Medication Sig   tiZANidine (ZANAFLEX) 4 MG tablet Take 1 tablet (4 mg total) by mouth at bedtime as needed for muscle spasms.   amLODipine (NORVASC) 5 MG tablet Take 1 tablet (5 mg total) by mouth daily.   metroNIDAZOLE (METROGEL) 1 % gel Apply topically daily.   No facility-administered encounter medications on file as of 08/22/2022.     Lab Results  Component Value Date   WBC 5.8 07/18/2022   HGB 14.8 07/18/2022   HCT 43.2 07/18/2022   PLT 163.0 07/18/2022   GLUCOSE 95 07/18/2022   CHOL 145 07/18/2022  TRIG 76.0 07/18/2022   HDL 35.80 (L) 07/18/2022   LDLCALC 94 07/18/2022   ALT 26 07/18/2022   AST 19 07/18/2022   NA 138 07/18/2022   K 4.1 07/18/2022   CL 104 07/18/2022   CREATININE 1.36 07/18/2022   BUN 19 07/18/2022   CO2 28 07/18/2022   TSH 1.31 07/18/2022   PSA 1.95 12/12/2021    No results found.     Assessment & Plan:  Neck pain -     DG Cervical Spine 2 or 3 views; Future  Other orders -     tiZANidine HCl; Take 1 tablet (4 mg total) by mouth at bedtime as needed for muscle spasms.  Dispense: 20 tablet; Refill: 0     Einar Pheasant, MD

## 2022-08-24 ENCOUNTER — Encounter: Payer: Self-pay | Admitting: Internal Medicine

## 2022-08-24 NOTE — Assessment & Plan Note (Signed)
Have discussed calculated cholesterol risk.  He has desired to hold on starting cholesterol medication.  Low cholesterol diet and exercise.  Follow lipid panel. Check lipid panel today.

## 2022-08-24 NOTE — Assessment & Plan Note (Signed)
Stay hydrated.  Avoid antiinflammatories.  Follow metabolic panel.   

## 2022-08-24 NOTE — Assessment & Plan Note (Signed)
On amlodipine.   Continue amlodipine.  Follow pressures.  Follow metabolic panel.

## 2022-08-24 NOTE — Assessment & Plan Note (Signed)
Neck pain and right side head sensation change as outlined.  Discussed possible etiologies.  Appears to be aggravated by certain position changes as outlined.  Appears to be more msk in origin.  Saw ENT as outlined - clear.  Will obtain c-spine xray given history of cervical disc issues.  Also treat with tizanidine as directed.  Follow.  Call with update.

## 2022-09-09 NOTE — Telephone Encounter (Signed)
See me before calling.  Spoke to Rodney Benjamin.  Discussed MRI. Discussed his MRI implant cards.  Has had MRI since his neck surgery.  Need to call Dr Dub Mikes office - ENT - and confirm if Rodney Benjamin can have MRI - any contraindication?    Wyatt Haste, MD  [address]  [place], [place] [address]  [phone] (Work)

## 2022-09-10 ENCOUNTER — Encounter: Payer: Self-pay | Admitting: Internal Medicine

## 2022-09-10 NOTE — Telephone Encounter (Signed)
Called Dr Dub Mikes office. Waiting for response.

## 2022-09-15 NOTE — Telephone Encounter (Signed)
MRI of the brain will not extend down to the neck.  This would be two totally different scans.  If you are agreeable, we will start with the MRI of the brain, given your symptoms and your history.  Let me know if any problems.   Dr Nicki Reaper

## 2022-09-17 ENCOUNTER — Ambulatory Visit
Admission: RE | Admit: 2022-09-17 | Discharge: 2022-09-17 | Disposition: A | Discharge status: Home / Self Care | Payer: 59 | Source: Ambulatory Visit | Attending: Internal Medicine | Admitting: Internal Medicine

## 2022-09-17 DIAGNOSIS — Z8669 Personal history of other diseases of the nervous system and sense organs: Secondary | ICD-10-CM | POA: Insufficient documentation

## 2022-09-17 DIAGNOSIS — R519 Headache, unspecified: Secondary | ICD-10-CM | POA: Insufficient documentation

## 2022-11-24 ENCOUNTER — Encounter: Payer: Self-pay | Admitting: Internal Medicine

## 2022-11-24 DIAGNOSIS — H9209 Otalgia, unspecified ear: Secondary | ICD-10-CM | POA: Insufficient documentation

## 2022-12-15 ENCOUNTER — Encounter: Payer: 59 | Admitting: Internal Medicine

## 2022-12-29 ENCOUNTER — Telehealth: Payer: Self-pay

## 2022-12-29 LAB — CBC AND DIFFERENTIAL
HCT: 45 (ref 41–53)
Hemoglobin: 14.6 (ref 13.5–17.5)
Neutrophils Absolute: 4
Platelets: 157 10*3/uL (ref 150–400)
WBC: 6.2

## 2022-12-29 LAB — POCT ERYTHROCYTE SEDIMENTATION RATE, NON-AUTOMATED: Sed Rate: 5

## 2022-12-29 LAB — CBC: RBC: 4.92 (ref 3.87–5.11)

## 2022-12-29 NOTE — Telephone Encounter (Signed)
LMTCB

## 2022-12-29 NOTE — Telephone Encounter (Signed)
Dr. Valora Piccolo called from [facility] and asked that Dr. Dale Reynoldsburg please call her at the end of her day regarding patient and medication.  Dr. Valora Piccolo states she needs to add a medication, but would like to speak with Dr. Lorin Picket first due to patient's kidney issue.

## 2022-12-30 NOTE — Telephone Encounter (Signed)
Called and spoke to Dr Valora Piccolo.  Plans to start Rodney Benjamin on doxycycline 50mg  bid for 4 weeks and then 50mg  q day.  She also reported some persistent sensation changes - side of head.  She is going to send notes/labs/information.  Please call and notify him that I did speak to her and can schedule an appt to discuss symptoms (as above).

## 2022-12-30 NOTE — Telephone Encounter (Signed)
Called patient. He has been scheduled to see Dr Lorin Picket on 7/17.

## 2023-01-01 NOTE — Therapy (Deleted)
OUTPATIENT PHYSICAL THERAPY EVALUATION   Patient Name: Rodney Benjamin MRN: [id number] DOB:Sep 27, 1960, 62 y.o., male Today's Date: 01/01/2023  END OF SESSION:   Past Medical History:  Diagnosis Date   History of chicken pox    Hypercholesterolemia    Hypertension    Past Surgical History:  Procedure Laterality Date   INNER EAR SURGERY     cholesteatoma removed x 2   KNEE SURGERY  1982   right   TONSILLECTOMY     Patient Active Problem List   Diagnosis Date Noted   Ear pain 11/24/2022   Neck pain 08/22/2022   Tick bite of right hip 06/26/2022   Colon cancer screening 06/22/2021   Leg skin lesion, left 12/25/2019   Hemorrhoid 12/09/2018   Headache 12/15/2017   GERD (gastroesophageal reflux disease) 04/25/2017   Tachycardia 04/23/2017   CKD (chronic kidney disease), stage III (HCC) 10/12/2016   Stress 01/26/2016   Loss of weight 01/31/2015   Cervical disc herniation 12/25/2014   Back pain 10/24/2014   Health care maintenance 10/24/2014   Fluttering heart 06/19/2014   Left arm numbness 06/19/2014   Renal cyst 06/12/2013   Hypercholesterolemia 06/06/2012   Hypertension 03/23/2012    PCP: Dale Burkburnett, MD  REFERRING PROVIDER: Loni Dolly, MP (neurosurgery)  REFERRING DIAG: cervical radiculopathy  THERAPY DIAG:  No diagnosis found.  Rationale for Evaluation and Treatment: Rehabilitation  ONSET DATE: ***  SUBJECTIVE:                                                                                                                                                                                                         SUBJECTIVE STATEMENT: ***  s/p C4-7 ACDF with Dr. Rise Mu 01/09/2015. He is scheduled on Monday for tympanoplasty and mastoidectomy. He is having right ear and surrounding area pain. He is also having pain into the face and occipital nerve territory on the right.   Hand dominance: {MISC; OT HAND DOMINANCE:[phone]}  PERTINENT  HISTORY:  Patient is a 62 y.o. male who presents to outpatient physical therapy with a referral for medical diagnosis cervical radiculopathy. This patient's chief complaints consist of ***, leading to the following functional deficits: ***. Patient has Hypertension; Hypercholesterolemia; Renal cyst; Fluttering heart; Left arm numbness; Back pain; Health care maintenance; Cervical disc herniation; Loss of weight; Stress; CKD (chronic kidney disease), stage III (HCC); Tachycardia; GERD (gastroesophageal reflux disease); Headache; Hemorrhoid; Leg skin lesion, left; Tick bite of right hip; Neck pain; and Ear pain on their problem list. He  has a past medical history of History  of Hypercholesterolemia, and Hypertension. He  has a past surgical history that includes Knee surgery (1982); Inner ear surgery; Tonsillectomy, C4-7 ACDF with Dr. Rise Mu 01/09/2015, surgery on the right ear 11/2022 for recurrent dermoid cyst abutting dehiscent sigmoid, meatoplasty performed and area around the sigmoid plate drilled down to exteriorize the cholesteatoma - matrix left over the sigmoid sinus due to risk of hemorrhage with removal.      Patient denies hx of {redflags:27294}    PAIN:  Are you having pain? Yes NPRS: Current: ***/10,  Best: ***/10, Worst: ***/10. Pain location: *** Pain description: *** Aggravating factors: *** Relieving factors: ***   FUNCTIONAL LIMITATIONS: ***  LEISURE: ***  PRECAUTIONS: {Therapy precautions:24002}   WEIGHT BEARING RESTRICTIONS: {Yes ***/No:24003}  FALLS:  Has patient fallen in last 6 months? {fallsyesno:27318}  LIVING ENVIRONMENT: Lives with: {[facility] lives with:25569::"lives with their family"} Lives in: {Lives in:25570} Stairs: {opstairs:27293} Has following equipment at home: {Assistive devices:23999}  OCCUPATION: ***  PLOF: {PLOF:24004}  PATIENT GOALS: ***  NEXT MD VISIT: ***  OBJECTIVE  DIAGNOSTIC FINDINGS:  Cervical spine xray from 11/19/2022:  4 view  cervical spine including flexion and extension views.   Comparison: 05/26/2018.   History:  M54.2 Cervicalgia.   Normal soft tissues.   Moderate list to the right. Loss of cervical lordosis with straightening..   No fracture.  C4-C7 ACDF redemonstrated..   C3-4 mild degenerative changes with loss of disc space height and early  osteophytes slightly progressed.  Screws overlie the C7-T1 space..          C2-3 small grade 1 anterolisthesis only on flexion.   C4-5 small grade 1 anterolisthesis.C5-6 small grade 1 anterolisthesis.  Neither of these are seen on the flexion and extension views may relate to  projection of the neutral lateral ..   Electronically Signed by:  Tana Conch, MD, [facility]  Electronically Signed on:  11/19/2022 3:29 PM   CT of temporal bone report 09/30/2022:  EXAM: CT TEMPORAL BONE WITHOUT CONTRAST   INDICATION: 62 years old Male with right sided mastoid signal on MRI,  H95.191 Other disorders following mastoidectomy, right ear, H90.A11  Conductive hearing loss, unilateral, right ear with restricted hearing on  the contralateral side.   TECHNIQUE: Axial 0.6 mm images of the temporal bones were obtained.  Sagittal and coronal reformatted images were generated from the axial data  set. Dose reduction was obtained with Automatic Exposure Control (AEC) or,  if AEC could not be utilized, by manual adjustment of the mA and/or kV  according to patient size.   COMPARISON: Brain MRI 01/01/2018   FINDINGS:   Right temporal bone: The external auditory canal is patent with  postsurgical deformity related to prior right mastoidectomy. Tympanic  membrane appears to be intact but there is soft tissue thickening at the  posterior aspect of the tympanic membrane which may be scarring and is  nonspecific. There is deformity of the ossicles with postsurgical change  and surgical prosthesis of the stapes at the oval window. The scutum has  been resected or resorbed.  The cochlea, vestibule, and semicircular canals  appear normally formed. There is no dilation of the endolymphatic duct. No  bony demineralization is seen to suggest spongiosis. No abnormal sclerosis  is seen within the labyrinth.   The facial nerve canal follows a normal course. No evidence of an  endolymphatic fistula or destructive osseous lesion is seen. No evidence of  jugular bulb dehiscence. Internal carotid artery is in expected location.  The residual mastoid air cells and middle ear are clear. Soft tissue at the  superior posterior tympanic membrane may represent recurrent cholesteatoma  or scarring.   Left temporal bone:  The external auditory canal is patent. Tympanic  membrane appears to be intact. The ossicles demonstrate normal alignment  and morphology. The cochlea, vestibule, and semicircular canals appear  normally formed. There is no dilation of the endolymphatic duct. No bony  demineralization is seen to suggest spongiosis. No abnormal sclerosis is  seen within the labyrinth.   The facial nerve canal follows a normal course without dehiscence. No  evidence of an endolymphatic fistula or destructive osseous lesion is seen.  No evidence of jugular bulb dehiscence. Internal carotid artery is in  expected location. The mastoid air cells and middle ear are clear. No  evidence of cholesteatoma.   The temporomandibular joints are unremarkable.   IMPRESSION:  1.  Postsurgical changes on the right status post mastoidectomy,  reconstruction of the external auditory canal and cholesteatoma resection  with prosthetic ossicle at the oval window. The other ossicles appear  deformed which may represent postsurgical change and sequela of prior  cholesteatoma. There is soft tissue thickening involving the superior and  posterior aspect of the tympanic membrane which may represent  scarring/granulation tissue or potentially recurrent small cholesteatoma  measuring 5 x 3 mm.  2.   Normal appearance of the left side structures without acute findings.   Electronically Signed by:  Augusto Gamble, MD, [facility]  Electronically Signed on:  09/30/2022 10:08 AM   SELF- REPORTED FUNCTION FOTO score: ***/100 (neck questionnaire)  OBSERVATION/INSPECTION Posture Posture (seated): forward head, rounded shoulders, slumped in sitting.  Posture (standing): *** Posture correction: *** Anthropometrics Tremor: none Body composition: *** Muscle bulk: *** Skin: The incision sites appear to be healing well with no excessive redness, warmth, drainage or signs of infection present.  *** Edema: *** Functional Mobility Bed mobility: *** Transfers: *** Gait: grossly WFL for household and short community ambulation. More detailed gait analysis deferred to later date as needed. *** Stairs: ***  SPINE MOTION  LUMBAR SPINE AROM *Indicates pain Flexion: *** Extension: *** Side Flexion:   R ***  L *** Rotation:  R *** L *** Side glide:  R *** L ***   NEUROLOGICAL  Upper Motor Neuron Screen Babinski, Hoffman's and Clonus (ankle) negative bilaterally.  Dermatomes C2-T1 appears equal and intact to light touch except the following: *** L2-S2 appears equal and intact to light touch except the following: *** Deep Tendon Reflexes R/L  ***+/***+ Biceps brachii reflex (C5, C6) ***+/***+ Brachioradialis reflex (C6) ***+/***+ Triceps brachii reflex (C7) ***+/***+ Quadriceps reflex (L4) ***+/***+ Achilles reflex (S1)  SPINE MOTION  CERVICAL SPINE AROM *Indicates pain Flexion: *** Extension: *** Side Flexion:   R ***  L *** Rotation:  R *** L ***   PERIPHERAL JOINT MOTION (in degrees)  ACTIVE RANGE OF MOTION (AROM) *Indicates pain Date Date Date  Joint/Motion R/L R/L R/L  Shoulder     Flexion / / /  Extension / / /  Abduction  / / /  External rotation / / /  Internal rotation / / /  Elbow     Flexion  / / /  Extension  / / /  Wrist     Flexion / / /   Extension  / / /  Radial deviation / / /  Ulnar deviation / / /  Pronation / / /  Supination / / /  Hip     Flexion / / /  Extension  / / /  Abduction / / /  Adduction / / /  External rotation / / /  Internal rotation  / / /  Knee     Extension / / /  Flexoin / / /  Ankle/Foot     Dorsiflexion (knee ext) / / /  Dorsiflexion (knee flex) / / /  Plantarflexion / / /  Everison / / /  Inversion / / /  Great toe extension / / /  Great toe flexion / / /  Comments:   PASSIVE RANGE OF MOTION (PROM) *Indicates pain Date Date Date  Joint/Motion R/L R/L R/L  Shoulder     Flexion / / /  Extension / / /  Abduction  / / /  External rotation / / /  Internal rotation / / /  Elbow     Flexion  / / /  Extension  / / /  Wrist     Flexion / / /  Extension  / / /  Radial deviation / / /  Ulnar deviation / / /  Pronation / / /  Supination / / /  Hip     Flexion  / / /  Extension  / / /  Abduction / / /  Adduction / / /  External rotation / / /  Internal rotation  / / /  Knee     Extension / / /  Flexion / / /  Ankle/Foot     Dorsiflexion (knee ext) / / /  Dorsiflexion (knee flex) / / /  Plantarflexion / / /  Everison / / /  Inversion / / /  Great toe extension / / /  Great toe flexion / / /  Comments:   MUSCLE PERFORMANCE (MMT):  *Indicates pain Date Date Date  Joint/Motion R/L R/L R/L  Shoulder     Flexion / / /  Abduction (C5) / / /  External rotation / / /  Internal rotation / / /  Extension / / /  Elbow     Flexion (C6) / / /  Extension (C7) / / /  Wrist     Flexion (C7) / / /  Extension (C6) / / /  Radial deviation / / /  Ulnar deviation (C8) / / /  Pronation / / /  Supination / / /  Hand     Thumb extension (C8) / / /  Finger abduction (T1) / / /  Grip (C8) / / /  Hip     Flexion (L1, L2) / / /  Extension (knee ext) / / /  Extension (knee flex) / / /  Abduction / / /  Adduction / / /  External rotation / / /  Internal rotation  / / /   Knee     Extension (L3) / / /  Flexion (S2) / / /  Ankle/Foot     Dorsiflexion (L4) / / /  Great toe extension (L5) / / /  Eversion (S1) / / /  Plantarflexion (S1) / / /  Inversion / / /  Pronation / / /  Great toe flexion / / /  Comments:   SPECIAL TESTS:  .Neurodynamictests .NeurodynamicUE .NeurodynamicLE .CspineInstability .CSPINESPECIALTESTS .SHOULDERSPECIALTESTCLUSTERS .HIPSPECIALTESTS .SIJSPECIALTESTS   SHOULDER SPECIAL TESTS RTC, Impingement, Anterior Instability (macrotrauma), Labral Tear: Painful arc test: R = ***, L = ***. Drop arm test: R = ***,  L = ***. Hawkins-Kennedy test: R = ***, L = ***. Infraspinatus test: R = ***, L = ***. Apprehension test: R = ***, L = ***. Relocation test: R = ***, L = ***. Active compression test: R = ***, L = ***.  ACCESSORY MOTION: ***  PALPATION: ***  SUSTAINED POSITIONS TESTING:  ***  REPEATED MOTIONS TESTING: ***  FUNCTIONAL/BALANCE TESTS: Five Time Sit to Stand (5TSTS): *** seconds Functional Gait Assessment (FGA): ***/30 (see details above) Ten meter walking trial ( ): *** m/s Six Minute Walk Test ( ): *** feet Timed Up and Go (TUG): *** seconds   Dynamic Gait Index: ***/24 BERG Balance Scale: ***/56 Tinetti/POMA: ***/28 Timed Up and GO: *** seconds (average of 3 trials) Trial 1: *** Trial 2: *** Trial 3: *** Romberg test: -Narrow stance, eyes open: *** seconds -Narrow stance, eyes closed: *** seconds Sharpened Romberg test: -Tandem stance, eyes open: *** seconds -Tandem stance, eyes closed: *** seconds  Narrow stance, firm surface, eyes open: *** seconds Narrow stance, firm surface, eyes closed: *** seconds Narrow stance, compliant surface, eyes open: *** seconds Narrow stance, compliant surface, eyes closed: *** seconds Single leg stance, firm surface, eyes open: R= *** seconds, L= *** seconds Single leg stance, compliant surface, eyes open: R= *** seconds, L= *** seconds Gait speed:  *** m/s Functional reach test: *** inches      TODAY'S TREATMENT:    PATIENT EDUCATION:  Education details: *** Person educated: {Person educated:25204} Education method: {Education Method:25205} Education comprehension: {Education Comprehension:25206}  HOME EXERCISE PROGRAM: ***  ASSESSMENT:  CLINICAL IMPRESSION: Patient is a 62 y.o. male referred to outpatient physical therapy with a medical diagnosis of cervical radiculopathy who presents with signs and symptoms consistent with ***. Patient presents with significant *** impairments that are limiting ability to complete *** without difficulty. Patient will benefit from skilled physical therapy intervention to address current body structure impairments and activity limitations to improve function and work towards goals set in current POC in order to return to prior level of function or maximal functional improvement.    OBJECTIVE IMPAIRMENTS: {opptimpairments:25111}.   ACTIVITY LIMITATIONS: {activitylimitations:27494}  PARTICIPATION LIMITATIONS: {participationrestrictions:25113}  PERSONAL FACTORS: {Personal factors:25162} are also affecting patient's functional outcome.   REHAB POTENTIAL: {rehabpotential:25112}  CLINICAL DECISION MAKING: {clinical decision making:25114}  EVALUATION COMPLEXITY: {Evaluation complexity:25115}   GOALS: Goals reviewed with patient? {yes/no:20286}  SHORT TERM GOALS: Target date: 01/15/2023  Patient will be independent with initial home exercise program for self-management of symptoms. Baseline: {HEPbaseline4:27310} (01/01/23); Goal status: INITIAL   LONG TERM GOALS: Target date: 03/26/2023  Patient will be independent with a long-term home exercise program for self-management of symptoms.  Baseline: {HEPbaseline4:27310} (01/01/23); Goal status: INITIAL  2.  Patient will demonstrate improved FOTO to equal or greater than *** by visit #*** to demonstrate improvement in overall condition  and self-reported functional ability.  Baseline: *** (01/01/23); Goal status: INITIAL  3.  *** Baseline: *** (01/01/23); Goal status: INITIAL  4.  *** Baseline: *** (01/01/23); Goal status: INITIAL  5.  Patient will complete community, work and/or recreational activities without limitation due to current condition.  Baseline: *** (01/01/23); Goal status: INITIAL  6.  *** Baseline: *** Goal status: INITIAL    PLAN:  PT FREQUENCY: {rehab frequency:25116}  PT DURATION: {rehab duration:25117}  PLANNED INTERVENTIONS: {rehab planned interventions:25118::"Therapeutic exercises","Therapeutic activity","Neuromuscular re-education","Balance training","Gait training","Patient/Family education","Self Care","Joint mobilization"}  PLAN FOR NEXT SESSION: ***   Cira Rue, PT,DPT 01/01/2023, 5:25 PM  [facility] [id number] [place], [place] [address] P: [phone] I F: [phone]

## 2023-01-06 NOTE — Therapy (Unsigned)
OUTPATIENT PHYSICAL THERAPY  EVALUATION   Patient Name: Rodney Benjamin MRN: [id number] DOB:11/12/1960, 62 y.o., male Today's Date: 01/07/2023  END OF SESSION:  PT End of Session - 01/07/23 1006     Visit Number 1    Number of Visits 13    Date for PT Re-Evaluation 04/01/23    Authorization Type [employer] reporting period from 01/07/2023    Authorization Time Period VL: 60 per cal yr    Authorization - Visit Number 1    Authorization - Number of Visits 60    Progress Note Due on Visit 10    PT Start Time 0847    PT Stop Time 0947    PT Time Calculation (min) 60 min    Activity Tolerance Patient tolerated treatment well    Behavior During Therapy [facility] for tasks assessed/performed             Past Medical History:  Diagnosis Date   History of chicken pox    Hypercholesterolemia    Hypertension    Past Surgical History:  Procedure Laterality Date   INNER EAR SURGERY     cholesteatoma removed x 2   KNEE SURGERY  1982   right   TONSILLECTOMY     Patient Active Problem List   Diagnosis Date Noted   Ear pain 11/24/2022   Neck pain 08/22/2022   Tick bite of right hip 06/26/2022   Colon cancer screening 06/22/2021   Leg skin lesion, left 12/25/2019   Hemorrhoid 12/09/2018   Headache 12/15/2017   GERD (gastroesophageal reflux disease) 04/25/2017   Tachycardia 04/23/2017   CKD (chronic kidney disease), stage III (HCC) 10/12/2016   Stress 01/26/2016   Loss of weight 01/31/2015   Cervical disc herniation 12/25/2014   Back pain 10/24/2014   Health care maintenance 10/24/2014   Fluttering heart 06/19/2014   Left arm numbness 06/19/2014   Renal cyst 06/12/2013   Hypercholesterolemia 06/06/2012   Hypertension 03/23/2012    PCP: Dale [place], MD   REFERRING PROVIDER: Loni Dolly, MP (neurosurgery)   REFERRING DIAG: cervical radiculopathy   THERAPY DIAG:  Cervicalgia  Neuralgia and neuritis    Rationale for Evaluation and Treatment:  Rehabilitation   ONSET DATE: Approx February 2024   SUBJECTIVE:                                                                                                                                                                                                          SUBJECTIVE STATEMENT: Patient states he is having pain on the right side  of his neck with intermittent paresthesia up the back of his head to the right top of his head when he turns his head right. He also gets pain over his right upper trap when he turns his head to the right. Patient states he had R ear surgery recently (per post op note: "surgery on the right ear 11/2022 for recurrent dermoid cyst abutting dehiscent sigmoid, meatoplasty performed and area around the sigmoid plate drilled down to exteriorize the cholesteatoma - matrix left over the sigmoid sinus due to risk of hemorrhage with removal"), so it has been hard for him to determine symptoms. His ear doctor did not thing what he was feeling up into his head was related to his ear. He did not notice a precipitating event for his neck pain. He thinks his pain has been there since February 2024. He went to his neck surgeon and his PA recommended he wait until his ear surgery, which he had hoped would take care of it. Since the surgery did not resolve the issue, he  is now proceeding with the physical therapy as the PA recommended. Some of the symptoms are better with the ear surgery. He still has packing in the ear. This is his second ear surgery for a cholesteatoma that grew back. He states he had a strange feeling prior to his first ear surgery that resolved with his first cholesteatoma surgery, but this time he feels like there is a combination of symptoms, and his neck symptoms seem separate. He does get headaches from the symptoms that radiate over the right side of his head. He describes it as discomfort more than outright headache.   He reports long history of small RTC on right  shoulder.    s/p C4-7 ACDF with Dr. Rise Mu 01/09/2015. Prior to this surgery he was having tingling in his left thumb. The ACDF largely resolved these symptoms.    Hand dominance: Right   PERTINENT HISTORY:  Patient is a 62 y.o. male who presents to outpatient physical therapy with a referral for medical diagnosis cervical radiculopathy. This patient's chief complaints consist of right sided neck pain with radiation to right upper trap and radiation of paresthesia into the top of the right side of the head, leading to the following functional deficits: difficulty working (doesn't feel as sharp), decreased quality of life. Patient has Hypertension; Hypercholesterolemia; Renal cyst; Fluttering heart; Left arm numbness; Back pain; Health care maintenance; Cervical disc herniation; Loss of weight; Stress; CKD (chronic kidney disease), stage III (HCC); Tachycardia; GERD (gastroesophageal reflux disease); Headache; Hemorrhoid; Leg skin lesion, left; Tick bite of right hip; Neck pain; and Ear pain on their problem list. He  has a past medical history of History of Hypercholesterolemia, and Hypertension. He  has a past surgical history that includes Knee surgery (1982); Inner ear surgery; Tonsillectomy, C4-7 ACDF with Dr. Rise Mu 01/09/2015, surgery on the right ear 11/24/2022 for recurrent dermoid cyst abutting dehiscent sigmoid, meatoplasty performed and area around the sigmoid plate drilled down to exteriorize the cholesteatoma - matrix left over the sigmoid sinus due to risk of hemorrhage with removal. Patient denies hx of cancer, stroke, seizures, lung problems, heart problems, diabetes, unexplained weight loss, unexplained changes in bowel or bladder problems, unexplained stumbling or dropping things, and osteoporosis.    PAIN:  Are you having pain? Yes NPRS: Current: 4/10,  Best: 1/10 (morning), Worst: 5-6/10 (lunch time) Pain location: right neck with radiation up the back of the right side of his head to  the top of his head and towards upper trap Pain description: tingling sensation,  Aggravating factors: worst around lunch time, right rotation with extension,  Relieving factors: morning time, put headphones on (pressure relieves tingling some), the more active he is the better he feels, touching the area of paresthesia   FUNCTIONAL LIMITATIONS: difficulty working (doesn't feel as sharp), decreased quality of life   LEISURE: hobby farm (sheep/cows/chickens/hay), working around the house all the time, works outdoors all day on Saturdays.    PRECAUTIONS: keep the packing in his ear.      WEIGHT BEARING RESTRICTIONS: No   FALLS:  Has patient fallen in last 6 months? No Not worried about falling.    OCCUPATION: full time office job   PLOF: Independent   PATIENT GOALS: "get rid of the pain"   NEXT MD VISIT: no appointment scheduled currently   OBJECTIVE   DIAGNOSTIC FINDINGS:  Cervical spine xray from 11/19/2022:  4 view cervical spine including flexion and extension views.   Comparison: 05/26/2018.   History:  M54.2 Cervicalgia.   Normal soft tissues.   Moderate list to the right. Loss of cervical lordosis with straightening..   No fracture.  C4-C7 ACDF redemonstrated..   C3-4 mild degenerative changes with loss of disc space height and early  osteophytes slightly progressed.  Screws overlie the C7-T1 space..          C2-3 small grade 1 anterolisthesis only on flexion.   C4-5 small grade 1 anterolisthesis.C5-6 small grade 1 anterolisthesis.  Neither of these are seen on the flexion and extension views may relate to  projection of the neutral lateral ..   Electronically Signed by:  Tana Conch, MD, [facility]  Electronically Signed on:  11/19/2022 3:29 PM    CT of temporal bone report 09/30/2022:  EXAM: CT TEMPORAL BONE WITHOUT CONTRAST   INDICATION: 62 years old Male with right sided mastoid signal on MRI,  H95.191 Other disorders following mastoidectomy, right  ear, H90.A11  Conductive hearing loss, unilateral, right ear with restricted hearing on  the contralateral side.   TECHNIQUE: Axial 0.6 mm images of the temporal bones were obtained.  Sagittal and coronal reformatted images were generated from the axial data  set. Dose reduction was obtained with Automatic Exposure Control (AEC) or,  if AEC could not be utilized, by manual adjustment of the mA and/or kV  according to patient size.   COMPARISON: Brain MRI 01/01/2018   FINDINGS:   Right temporal bone: The external auditory canal is patent with  postsurgical deformity related to prior right mastoidectomy. Tympanic  membrane appears to be intact but there is soft tissue thickening at the  posterior aspect of the tympanic membrane which may be scarring and is  nonspecific. There is deformity of the ossicles with postsurgical change  and surgical prosthesis of the stapes at the oval window. The scutum has  been resected or resorbed. The cochlea, vestibule, and semicircular canals  appear normally formed. There is no dilation of the endolymphatic duct. No  bony demineralization is seen to suggest spongiosis. No abnormal sclerosis  is seen within the labyrinth.   The facial nerve canal follows a normal course. No evidence of an  endolymphatic fistula or destructive osseous lesion is seen. No evidence of  jugular bulb dehiscence. Internal carotid artery is in expected location.  The residual mastoid air cells and middle ear are clear. Soft tissue at the  superior posterior tympanic membrane may represent recurrent cholesteatoma  or scarring.  Left temporal bone:  The external auditory canal is patent. Tympanic  membrane appears to be intact. The ossicles demonstrate normal alignment  and morphology. The cochlea, vestibule, and semicircular canals appear  normally formed. There is no dilation of the endolymphatic duct. No bony  demineralization is seen to suggest spongiosis. No abnormal  sclerosis is  seen within the labyrinth.   The facial nerve canal follows a normal course without dehiscence. No  evidence of an endolymphatic fistula or destructive osseous lesion is seen.  No evidence of jugular bulb dehiscence. Internal carotid artery is in  expected location. The mastoid air cells and middle ear are clear. No  evidence of cholesteatoma.   The temporomandibular joints are unremarkable.   IMPRESSION:  1.  Postsurgical changes on the right status post mastoidectomy,  reconstruction of the external auditory canal and cholesteatoma resection  with prosthetic ossicle at the oval window. The other ossicles appear  deformed which may represent postsurgical change and sequela of prior  cholesteatoma. There is soft tissue thickening involving the superior and  posterior aspect of the tympanic membrane which may represent  scarring/granulation tissue or potentially recurrent small cholesteatoma  measuring 5 x 3 mm.  2.  Normal appearance of the left side structures without acute findings.   Electronically Signed by:  Augusto Gamble, MD, [facility]  Electronically Signed on:  09/30/2022 10:08 AM    SELF- REPORTED FUNCTION FOTO score: 70/100 (neck questionnaire)   OBSERVATION/INSPECTION Posture Posture (seated): moderate forward head, mildly rounded shoulders, increased thoracic kyphosis and decreased lumbar lordosis.  Anthropometrics Tremor: none Body composition: WNL Muscle bulk: WNL Skin: The incision sites appear to be healing well with no excessive redness, warmth, drainage or signs of infection present.  Edema: none Functional Mobility Bed mobility: supine <> sit and rolling I Transfers: sit <> stand I Gait: grossly WFL for household and short community ambulation. More detailed gait analysis deferred to later date as needed.    NEUROLOGICAL Dermatomes C1-T1 appears equal and intact to light touch.   SPINE MOTION   CERVICAL SPINE AROM *Indicates  pain Flexion: 35 most motion from upper cervical spine Extension: 50 mild catch at left base of neck, most of motion from upper cervical spine Side Flexion:       R 26 increased tightness at UT and soreness in neck      L 20 increased tightness at UT and soreness in neck Rotation:  R 40 increased tightness at UT and soreness in neck, increased tightness up to the head L 50 increased tightness at UT and soreness in neck Protraction: WFL Retraction: 5/8 inches, no change in symptoms R Rotation/extension: reproduces symptoms     PERIPHERAL JOINT MOTION (in degrees) ACTIVE RANGE OF MOTION (AROM) 01/07/2023: B UE grossly WFL except increased pulling at right neck and upper trap region with R side 90/90 position of shoulder.    MUSCLE PERFORMANCE (MMT):  *Indicates pain 01/07/23 Date Date  Joint/Motion R/L R/L R/L  Shoulder        Flexion 4+/4+ / /  Abduction (C5) 4+/4+ / /  External rotation 5/4+ / /  Internal rotation 5/5 / /  Extension / / /  Elbow        Flexion (C6) 5/5 / /  Extension (C7) 4+/4+ / /  Hand        Thumb extension (C8) B WNL / /  Finger abduction (T1) B WNL / /    SPECIAL TESTS: CERVICAL SPINE Cervical spine axial  compression: negative Spurling's part B:  R = reproduced neck pain, L = negative Cervical spine axial distraction: unclear   ACCESSORY MOTION: Hypomobile to CPA along cervical spine and throughout thoracic spine.    PALPATION: TTP at right upper trap with strong reproduction of symptoms  Also TTP and reproduced symptoms with palpation to right SCM, and right cervical paraspinal muscles.  Negative for tenderness or reproduction of symptom in suboccipital region.     TODAY'S TREATMENT:     PATIENT EDUCATION:  Education details: Exercise purpose/form. Self management techniques. Education on diagnosis, prognosis, POC, anatomy and physiology of current condition. Education on HEP including handout.  Person educated: Patient Education method:  Explanation, Demonstration, Tactile cues, Verbal cues, and Handouts Education comprehension: verbalized understanding, returned demonstration, tactile cues required, and needs further education   HOME EXERCISE PROGRAM: Access Code: [email or web address] URL: [email or web address] Date: 01/07/2023 Prepared by: Norton Blizzard  Exercises - Seated Levator Scapulae Stretch  - 1-2 x daily - 3 sets - 30 seconds hold - Seated Upper Trapezius Stretch  - 1-2 x daily - 3 sets - 30 seconds hold   ASSESSMENT:   CLINICAL IMPRESSION: Patient is a 62 y.o. male referred to outpatient physical therapy with a medical diagnosis of cervical radiculopathy who presents with signs and symptoms consistent with chronic right sided neck pain with radiation into right side of head and into upper trap. Patient's pain appears to be referring from tight and tender portions of right upper trap, cervical paraspinals, and SCM. No radiation identified from suboccipital region at initial eval. No reproduction of distal symptoms with Spurling's part B test at initial eval.  Patient does have significant hypomobility in thoracic and cervical spine with increased forward head posture that may also be contributing to symptoms. Patient presents with significant pain, ROM, joint stiffness, muscle tension, posture, paresthesia, muscle performance (strength/power/endurance), and activity tolerance impairments that are limiting ability to complete work and daily activities without difficulty and negatively effects quality of life. Patient will benefit from skilled physical therapy intervention to address current body structure impairments and activity limitations to improve function and work towards goals set in current POC in order to return to prior level of function or maximal functional improvement.    OBJECTIVE IMPAIRMENTS: decreased activity tolerance, decreased knowledge of condition, decreased mobility, decreased ROM, hypomobility,  increased muscle spasms, impaired flexibility, impaired tone, improper body mechanics, postural dysfunction, and pain.    ACTIVITY LIMITATIONS: working, quality of life   PARTICIPATION LIMITATIONS: occupation   PERSONAL FACTORS: Time since onset of injury/illness/exacerbation and 3+ comorbidities: Hypertension; Hypercholesterolemia; Renal cyst; Fluttering heart; Left arm numbness; Back pain; Health care maintenance; Cervical disc herniation; Loss of weight; Stress; CKD (chronic kidney disease), stage III (HCC); Tachycardia; GERD (gastroesophageal reflux disease); Headache; Hemorrhoid; Leg skin lesion, left; Tick bite of right hip; Neck pain; and Ear pain on their problem list. He  has a past medical history of History of Hypercholesterolemia, and Hypertension. He  has a past surgical history that includes Knee surgery (1982); Inner ear surgery; Tonsillectomy, C4-7 ACDF with Dr. Rise Mu 01/09/2015, surgery on the right ear 11/24/2022 for recurrent dermoid cyst abutting dehiscent sigmoid, meatoplasty performed and area around the sigmoid plate drilled down to exteriorize the cholesteatoma - matrix left over the sigmoid sinus due to risk of hemorrhage with removal are also affecting patient's functional outcome.    REHAB POTENTIAL: Good   CLINICAL DECISION MAKING: Evolving/moderate complexity   EVALUATION COMPLEXITY: Moderate     GOALS: Goals reviewed with  patient? No  SHORT TERM GOALS: Target date: 01/21/2023   Patient will be independent with initial home exercise program for self-management of symptoms. Baseline: Initial HEP provided at IE (01/07/23); Goal status: INITIAL     LONG TERM GOALS: Target date: 04/01/2023   Patient will be independent with a long-term home exercise program for self-management of symptoms.  Baseline: Initial HEP provided at IE (01/07/23); Goal status: INITIAL   2.  Patient will demonstrate improved FOTO to equal or greater than 75 by visit #10 to demonstrate  improvement in overall condition and self-reported functional ability.  Baseline: 70 (01/07/23); Goal status: INITIAL   3.  Patient will demonstrate ability to complete end range right cervical spine rotation plus extension without increased symptoms to improve his ability to complete work activities.  Baseline: most strongly reproduces symptoms (01/07/23); Goal status: INITIAL   4.  Patient will demonstrate cervical spine AROM rotation equal or greater than 60 degrees bilaterally to improve ability to check blind spot when driving.  Baseline: R/L 40/50 (01/07/23); Goal status: INITIAL   5.  Patient will complete community, work and/or recreational activities with 75% less limitation due to current condition.  Baseline: difficulty with work and quality of life (01/07/23); Goal status: INITIAL       PLAN:   PT FREQUENCY: 1-2x/week   PT DURATION: 12 weeks   PLANNED INTERVENTIONS: Therapeutic exercises, Therapeutic activity, Neuromuscular re-education, Patient/Family education, Self Care, Joint mobilization, Dry Needling, Electrical stimulation, Spinal mobilization, Cryotherapy, Moist heat, Manual therapy, and Re-evaluation   PLAN FOR NEXT SESSION: Progressive neck/postural/functional strengthening, motor control, ROM, and stretching exercises as tolerated. Manual therapy and dry needling to decrease muscle tension and joint mobility. Education.    Cira Rue, PT, DPT 01/07/2023, 10:28 AM  [facility] [address] [place], [place] [address] P: [phone] I F: [phone]

## 2023-01-07 ENCOUNTER — Ambulatory Visit: Payer: 59 | Attending: Nurse Practitioner | Admitting: Physical Therapy

## 2023-01-07 ENCOUNTER — Encounter: Payer: Self-pay | Admitting: Physical Therapy

## 2023-01-07 ENCOUNTER — Encounter: Payer: Self-pay | Admitting: Internal Medicine

## 2023-01-07 ENCOUNTER — Ambulatory Visit (INDEPENDENT_AMBULATORY_CARE_PROVIDER_SITE_OTHER): Payer: 59 | Admitting: Internal Medicine

## 2023-01-07 VITALS — BP 136/78 | HR 70 | Temp 97.9°F | Resp 16 | Ht 73.0 in | Wt 231.6 lb

## 2023-01-07 DIAGNOSIS — M792 Neuralgia and neuritis, unspecified: Secondary | ICD-10-CM | POA: Diagnosis present

## 2023-01-07 DIAGNOSIS — R519 Headache, unspecified: Secondary | ICD-10-CM | POA: Diagnosis not present

## 2023-01-07 DIAGNOSIS — N1831 Chronic kidney disease, stage 3a: Secondary | ICD-10-CM

## 2023-01-07 DIAGNOSIS — M5020 Other cervical disc displacement, unspecified cervical region: Secondary | ICD-10-CM | POA: Diagnosis not present

## 2023-01-07 DIAGNOSIS — M542 Cervicalgia: Secondary | ICD-10-CM | POA: Diagnosis present

## 2023-01-07 DIAGNOSIS — I10 Essential (primary) hypertension: Secondary | ICD-10-CM | POA: Diagnosis not present

## 2023-01-07 NOTE — Progress Notes (Signed)
Subjective:    Patient ID: Rodney Benjamin, male    DOB: June 11, 1961, 62 y.o.   MRN: [id number]  Patient here for  Chief Complaint  Patient presents with   Post-op Follow-up    Discuss persistent symptoms after ear surgery 11/24/22    HPI Here for work in appt.  Has a history of right cholesteatoma s/p modified radical mastoidectomy with meatoplasty in 2019 and revision tympanomastoidectomy in 2020 with TORP and cartilage graft. Is s/p surgery on the right ear 11/2022 for recurrent dermoid cyst. Saw [facility] 11/19/22 - for evaluation of pain right ear and surrounding area. Also pain in the face and occipital nerve territory. Pain worse with turning his head to the right. Recommended referral to PT. If persistent, recommended CT and MRI - cervical. PT started today. Therapist was able to reproduce symptoms. Right ear clogged. Has f/u regarding ENT/Ear.    Past Medical History:  Diagnosis Date   History of chicken pox    Hypercholesterolemia    Hypertension    Past Surgical History:  Procedure Laterality Date   INNER EAR SURGERY     cholesteatoma removed x 2   KNEE SURGERY  1982   right   TONSILLECTOMY     Family History  Problem Relation Age of Onset   Prostate cancer Other    Hypertension Other    Breast cancer Other    Social History   Socioeconomic History   Marital status: Married    Spouse name: Not on file   Number of children: Not on file   Years of education: Not on file   Highest education level: Not on file  Occupational History   Not on file  Tobacco Use   Smoking status: Never   Smokeless tobacco: Never  Substance and Sexual Activity   Alcohol use: No    Alcohol/week: 0.0 standard drinks of alcohol   Drug use: No   Sexual activity: Not on file  Other Topics Concern   Not on file  Social History Narrative   Not on file   Social Determinants of Health   Financial Resource Strain: Not on file  Food Insecurity: Not on file  Transportation Needs: Not  on file  Physical Activity: Not on file  Stress: Not on file  Social Connections: Not on file     Review of Systems  Constitutional:  Negative for appetite change and unexpected weight change.  HENT:  Negative for congestion and sinus pressure.   Respiratory:  Negative for cough, chest tightness and shortness of breath.   Cardiovascular:  Negative for chest pain, palpitations and leg swelling.  Gastrointestinal:  Negative for abdominal pain, diarrhea, nausea and vomiting.  Genitourinary:  Negative for difficulty urinating and dysuria.  Musculoskeletal:  Negative for joint swelling and myalgias.  Skin:  Negative for color change and rash.  Neurological:  Negative for dizziness.       Sensation change - side of face as outlined.   Psychiatric/Behavioral:  Negative for agitation and dysphoric mood.        Objective:     BP 136/78   Pulse 70   Temp 97.9 F (36.6 C)   Resp 16   Ht 6\' 1"  (1.854 m)   Wt 231 lb 9.6 oz (105.1 kg)   SpO2 98%   BMI 30.56 kg/m  Wt Readings from Last 3 Encounters:  01/07/23 231 lb 9.6 oz (105.1 kg)  08/22/22 224 lb (101.6 kg)  06/26/22 223 lb 9.6 oz (101.4  kg)    Physical Exam Vitals reviewed.  Constitutional:      General: He is not in acute distress.    Appearance: Normal appearance. He is well-developed.  HENT:     Head: Normocephalic and atraumatic.     Right Ear: External ear normal.     Left Ear: External ear normal.  Eyes:     General: No scleral icterus.       Right eye: No discharge.        Left eye: No discharge.     Conjunctiva/sclera: Conjunctivae normal.  Cardiovascular:     Rate and Rhythm: Normal rate and regular rhythm.  Pulmonary:     Effort: Pulmonary effort is normal. No respiratory distress.     Breath sounds: Normal breath sounds.  Abdominal:     General: Bowel sounds are normal.     Palpations: Abdomen is soft.     Tenderness: There is no abdominal tenderness.  Musculoskeletal:        General: No swelling or  tenderness.     Cervical back: Neck supple. No tenderness.  Lymphadenopathy:     Cervical: No cervical adenopathy.  Skin:    Findings: No erythema or rash.  Neurological:     Mental Status: He is alert.  Psychiatric:        Mood and Affect: Mood normal.        Behavior: Behavior normal.      Outpatient Encounter Medications as of 01/07/2023  Medication Sig   doxycycline (MONODOX) 50 MG capsule Take 50 mg by mouth 2 (two) times daily.   amLODipine (NORVASC) 5 MG tablet Take 1 tablet (5 mg total) by mouth daily.   metroNIDAZOLE (METROGEL) 1 % gel Apply topically daily.   [DISCONTINUED] tiZANidine (ZANAFLEX) 4 MG tablet Take 1 tablet (4 mg total) by mouth at bedtime as needed for muscle spasms.   No facility-administered encounter medications on file as of 01/07/2023.     Lab Results  Component Value Date   WBC 6.2 12/29/2022   HGB 14.6 12/29/2022   HCT 45 12/29/2022   PLT 157 12/29/2022   GLUCOSE 95 07/18/2022   CHOL 145 07/18/2022   TRIG 76.0 07/18/2022   HDL 35.80 (L) 07/18/2022   LDLCALC 94 07/18/2022   ALT 26 07/18/2022   AST 19 07/18/2022   NA 138 07/18/2022   K 4.1 07/18/2022   CL 104 07/18/2022   CREATININE 1.36 07/18/2022   BUN 19 07/18/2022   CO2 28 07/18/2022   TSH 1.31 07/18/2022   PSA 1.95 12/12/2021    MR Brain Wo Contrast  Result Date: 09/17/2022 CLINICAL DATA:  Provided history: Headache, new onset. EXAM: MRI HEAD WITHOUT CONTRAST TECHNIQUE: Multiplanar, multiecho pulse sequences of the brain and surrounding structures were obtained without intravenous contrast. COMPARISON:  Report from head CT 12/12/1998 (images unavailable). FINDINGS: Brain: No age advanced or lobar predominant parenchymal atrophy. Mild multifocal T2 FLAIR hyperintensity within the cerebral white matter, nonspecific. There is no acute infarct. No evidence of an intracranial mass. No chronic intracranial blood products. No extra-axial fluid collection. No midline shift. Vascular:  Maintained flow voids within the proximal large arterial vessels. Skull and upper cervical spine: No focal suspicious marrow lesion. Sinuses/Orbits: No mass or acute finding within the imaged orbits. Small mucous retention cysts within the right maxillary sinus. Other: Prior right mastoidectomy with postoperative changes to the right middle, incompletely assessed by MR modality. Nonspecific 9 mm T2 hyperintense focus within remaining right mastoid air cells (  for instance as seen on series 10, image 7). 9 mm Tornwaldt/retention cyst within the posterior nasopharynx. IMPRESSION: 1. No evidence of an acute intracranial abnormality. 2. Mild multifocal T2 FLAIR hyperintense signal abnormality within the cerebral white matter. These signal changes are nonspecific, but most often secondary to chronic small vessel ischemia. Alternative considerations include sequelae of chronic migraine headaches, sequelae of a prior infectious/inflammatory process or sequelae of a demyelinating process (such as multiple sclerosis), among others. 3. Prior right mastoidectomy with postoperative changes to the right middle ear, incompletely assessed by MR modality. Nonspecific 9 mm focus of T2 hyperintense signal abnormality within remaining right mastoid air cells. A temporal bone CT may be obtained for further characterization, as clinically warranted. 4. Small mucous retention cysts within the right maxillary sinus. Electronically Signed   By: Jackey Loge D.O.   On: 09/17/2022 15:46       Assessment & Plan:  Primary hypertension Assessment & Plan: On amlodipine.   Continue amlodipine.  Follow pressures.  Follow metabolic panel.    Stage 3a chronic kidney disease (HCC) Assessment & Plan: Stay hydrated. Avoid antiinflammatories.  Follow metabolic panel.    Facial pain Assessment & Plan: Saw [facility] 11/19/22 - for evaluation of pain right ear and surrounding area. Also pain in the face and occipital nerve territory. Pain worse  with turning his head to the right. Recommended referral to PT. If persistent, recommended CT and MRI - cervical. PT started today. Therapist was able to reproduce symptoms. Given persistent symptoms, discussed possible occipital neuralgia, trigeminal neuralgia, etc.  Discussed neurology referral.    Orders: -     Ambulatory referral to Neurology  Cervical disc herniation Assessment & Plan: Is s/p discectomy and fusion.  Saw [facility] 11/19/22 - for evaluation of pain right ear and surrounding area. Also pain in the face and occipital nerve territory. Pain worse with turning his head to the right. Recommended referral to PT. If persistent, recommended CT and MRI - cervical. PT started today. Therapist was able to reproduce symptoms.       Dale [place], MD

## 2023-01-10 ENCOUNTER — Encounter: Payer: Self-pay | Admitting: Internal Medicine

## 2023-01-10 DIAGNOSIS — R519 Headache, unspecified: Secondary | ICD-10-CM | POA: Insufficient documentation

## 2023-01-10 NOTE — Assessment & Plan Note (Signed)
On amlodipine.   Continue amlodipine.  Follow pressures.  Follow metabolic panel.

## 2023-01-10 NOTE — Assessment & Plan Note (Signed)
Saw [facility] 11/19/22 - for evaluation of pain right ear and surrounding area. Also pain in the face and occipital nerve territory. Pain worse with turning his head to the right. Recommended referral to PT. If persistent, recommended CT and MRI - cervical. PT started today. Therapist was able to reproduce symptoms. Given persistent symptoms, discussed possible occipital neuralgia, trigeminal neuralgia, etc.  Discussed neurology referral.

## 2023-01-10 NOTE — Assessment & Plan Note (Signed)
Stay hydrated.  Avoid antiinflammatories.  Follow metabolic panel.

## 2023-01-10 NOTE — Assessment & Plan Note (Signed)
Is s/p discectomy and fusion.  Saw [facility]U 11/19/22 - for evaluation of pain right ear and surrounding area. Also pain in the face and occipital nerve territory. Pain worse with turning his head to the right. Recommended referral to PT. If persistent, recommended CT and MRI - cervical. PT started today. Therapist was able to reproduce symptoms.

## 2023-01-12 NOTE — Therapy (Signed)
OUTPATIENT PHYSICAL THERAPY  TREATMENT NOTE   Patient Name: Rodney Benjamin MRN: [id number] DOB:Feb 13, 1961, 62 y.o., male Today's Date: 01/13/2023  END OF SESSION:  PT End of Session - 01/13/23 1127     Visit Number 2    Number of Visits 13    Date for PT Re-Evaluation 04/01/23    Authorization Type [employer] reporting period from 01/07/2023    Authorization Time Period VL: 60 per cal yr    Authorization - Number of Visits 60    Progress Note Due on Visit 10    PT Start Time 0947    PT Stop Time 1025    PT Time Calculation (min) 38 min    Activity Tolerance Patient tolerated treatment well    Behavior During Therapy [facility] for tasks assessed/performed              Past Medical History:  Diagnosis Date   History of chicken pox    Hypercholesterolemia    Hypertension    Past Surgical History:  Procedure Laterality Date   INNER EAR SURGERY     cholesteatoma removed x 2   KNEE SURGERY  1982   right   TONSILLECTOMY     Patient Active Problem List   Diagnosis Date Noted   Facial pain 01/10/2023   Ear pain 11/24/2022   Neck pain 08/22/2022   Tick bite of right hip 06/26/2022   Colon cancer screening 06/22/2021   Leg skin lesion, left 12/25/2019   Hemorrhoid 12/09/2018   Headache 12/15/2017   GERD (gastroesophageal reflux disease) 04/25/2017   Tachycardia 04/23/2017   CKD (chronic kidney disease), stage III (HCC) 10/12/2016   Stress 01/26/2016   Loss of weight 01/31/2015   Cervical disc herniation 12/25/2014   Back pain 10/24/2014   Health care maintenance 10/24/2014   Fluttering heart 06/19/2014   Left arm numbness 06/19/2014   Renal cyst 06/12/2013   Hypercholesterolemia 06/06/2012   Hypertension 03/23/2012    PCP: Dale [place], MD   REFERRING PROVIDER: Loni Dolly, MP (neurosurgery)   REFERRING DIAG: cervical radiculopathy   THERAPY DIAG:  Cervicalgia  Neuralgia and neuritis    Rationale for Evaluation and Treatment: Rehabilitation    ONSET DATE: Approx February 2024   PERTINENT HISTORY:  Patient is a 62 y.o. male who presents to outpatient physical therapy with a referral for medical diagnosis cervical radiculopathy. This patient's chief complaints consist of right sided neck pain with radiation to right upper trap and radiation of paresthesia into the top of the right side of the head, leading to the following functional deficits: difficulty working (doesn't feel as sharp), decreased quality of life. Patient has Hypertension; Hypercholesterolemia; Renal cyst; Fluttering heart; Left arm numbness; Back pain; Health care maintenance; Cervical disc herniation; Loss of weight; Stress; CKD (chronic kidney disease), stage III (HCC); Tachycardia; GERD (gastroesophageal reflux disease); Headache; Hemorrhoid; Leg skin lesion, left; Tick bite of right hip; Neck pain; and Ear pain on their problem list. He  has a past medical history of History of Hypercholesterolemia, and Hypertension. He  has a past surgical history that includes Knee surgery (1982); Inner ear surgery; Tonsillectomy, C4-7 ACDF with Dr. Rise Mu 01/09/2015, surgery on the right ear 11/24/2022 for recurrent dermoid cyst abutting dehiscent sigmoid, meatoplasty performed and area around the sigmoid plate drilled down to exteriorize the cholesteatoma - matrix left over the sigmoid sinus due to risk of hemorrhage with removal. Patient denies hx of cancer, stroke, seizures, lung problems, heart problems, diabetes, unexplained weight loss,  unexplained changes in bowel or bladder problems, unexplained stumbling or dropping things, and osteoporosis.   SUBJECTIVE:                                                                                                                                                                                                          SUBJECTIVE STATEMENT: Patient states he was a little sore in his upper traps where PT did manual therapy last session, but he has  had no change in his head pain. He states he can feel it into his head when he does the stretches from his HEP. He has no questions about his HEP.    PAIN:  NPRS: 4/10  over right upper trap, radiating to right side of head.    PRECAUTIONS: keep the packing in his ear.      PATIENT GOALS: "get rid of the pain"   NEXT MD VISIT: no appointment scheduled currently   OBJECTIVE  Vitals:   01/13/23 0950  BP: 123/81  Pulse: 70  SpO2: 100%      TODAY'S TREATMENT:   Therapeutic exercise: to centralize symptoms and improve ROM, strength, muscular endurance, and activity tolerance required for successful completion of functional activities.  - vitals to establish baseline (see above) - Upper body ergometer level 5 to encourage joint nutrition, warm tissue, induce analgesic effect of aerobic exercise, improve muscular strength and endurance,  and prepare for remainder of session. (Manual therapy / dry needling - see below) - standing B shoulder ER with BlackTB, 1x15 - standing lower trap setting, 1x10 - Education on HEP including handout   Manual therapy: to reduce pain and tissue tension, improve range of motion, neuromodulation, in order to promote improved ability to complete functional activities. PRONE - STM to right upper trap and levator scap region SUPINE - R UT PROM stretch, 3x45 seconds  Modality: Dry needling performed to right upper trap to decrease pain and spasms along patient's neck and  region with patient in prone utilizing 2 dry needle(s) .25mm x 40mm with 2 sticks at right upper trap . Patient educated about the risks and benefits from therapy and verbally consents to treatment. Patient had strong twitch response at right upper trap.  Dry needling performed by Luretha Murphy. Ilsa Iha PT, DPT who is certified in this technique.  Pt required multimodal cuing for proper technique and to facilitate improved neuromuscular control, strength, range of motion, and functional ability  resulting in improved performance and form.  PATIENT EDUCATION:  Education details: Exercise purpose/form. Self management techniques. Education on [employer]  including handout.  Person educated: Patient Education method: Explanation, Demonstration, Tactile cues, Verbal cues, and Handouts Education comprehension: verbalized understanding, returned demonstration, tactile cues required, and needs further education   HOME EXERCISE PROGRAM: Access Code: [email or web address] URL: [email or web address] Date: 01/13/2023 Prepared by: Norton Blizzard  Exercises - Seated Levator Scapulae Stretch  - 1-2 x daily - 3 sets - 30 seconds hold - Seated Upper Trapezius Stretch  - 1-2 x daily - 3 sets - 30 seconds hold - Shoulder External Rotation and Scapular Retraction with Resistance  - 1 x daily - 3 sets - 15 reps - Low Trap Setting at Wall  - 1 x daily - 3 sets - 10 reps   ASSESSMENT:   CLINICAL IMPRESSION: Patient arrives reporting continued pain at the right upper trap and head. Utilized dry needling, soft tissue mobilization, and PROM UT stretch to decrease tension in right upper trap and decrease pain radiating to right side of head. Patient had strong twitch response to dry needling and reported decreased radiation by end of session. HEP updated to include some scapular and lower trap strengthening. Plan to continue with dry needling next session as appropriate.  Patient would benefit from continued management of limiting condition by skilled physical therapist to address remaining impairments and functional limitations to work towards stated goals and return to PLOF or maximal functional independence.   From initial PT evaluation 01/07/2023:  Patient is a 62 y.o. male referred to outpatient physical therapy with a medical diagnosis of cervical radiculopathy who presents with signs and symptoms consistent with chronic right sided neck pain with radiation into right side of head and into upper trap. Patient's  pain appears to be referring from tight and tender portions of right upper trap, cervical paraspinals, and SCM. No radiation identified from suboccipital region at initial eval. No reproduction of distal symptoms with Spurling's part B test at initial eval.  Patient does have significant hypomobility in thoracic and cervical spine with increased forward head posture that may also be contributing to symptoms. Patient presents with significant pain, ROM, joint stiffness, muscle tension, posture, paresthesia, muscle performance (strength/power/endurance), and activity tolerance impairments that are limiting ability to complete work and daily activities without difficulty and negatively effects quality of life. Patient will benefit from skilled physical therapy intervention to address current body structure impairments and activity limitations to improve function and work towards goals set in current POC in order to return to prior level of function or maximal functional improvement.    OBJECTIVE IMPAIRMENTS: decreased activity tolerance, decreased knowledge of condition, decreased mobility, decreased ROM, hypomobility, increased muscle spasms, impaired flexibility, impaired tone, improper body mechanics, postural dysfunction, and pain.    ACTIVITY LIMITATIONS: working, quality of life   PARTICIPATION LIMITATIONS: occupation   PERSONAL FACTORS: Time since onset of injury/illness/exacerbation and 3+ comorbidities: Hypertension; Hypercholesterolemia; Renal cyst; Fluttering heart; Left arm numbness; Back pain; Health care maintenance; Cervical disc herniation; Loss of weight; Stress; CKD (chronic kidney disease), stage III (HCC); Tachycardia; GERD (gastroesophageal reflux disease); Headache; Hemorrhoid; Leg skin lesion, left; Tick bite of right hip; Neck pain; and Ear pain on their problem list. He  has a past medical history of History of Hypercholesterolemia, and Hypertension. He  has a past surgical history that  includes Knee surgery (1982); Inner ear surgery; Tonsillectomy, C4-7 ACDF with Dr. Rise Mu 01/09/2015, surgery on the right ear 11/24/2022 for recurrent dermoid cyst abutting dehiscent sigmoid, meatoplasty performed and area around the sigmoid plate drilled down to exteriorize the cholesteatoma -  matrix left over the sigmoid sinus due to risk of hemorrhage with removal are also affecting patient's functional outcome.    REHAB POTENTIAL: Good   CLINICAL DECISION MAKING: Evolving/moderate complexity   EVALUATION COMPLEXITY: Moderate     GOALS: Goals reviewed with patient? No  SHORT TERM GOALS: Target date: 01/21/2023   Patient will be independent with initial home exercise program for self-management of symptoms. Baseline: Initial HEP provided at IE (01/07/23); Goal status: In-progress     LONG TERM GOALS: Target date: 04/01/2023   Patient will be independent with a long-term home exercise program for self-management of symptoms.  Baseline: Initial HEP provided at IE (01/07/23); Goal status: In-progress   2.  Patient will demonstrate improved FOTO to equal or greater than 75 by visit #10 to demonstrate improvement in overall condition and self-reported functional ability.  Baseline: 70 (01/07/23); Goal status: In-progress   3.  Patient will demonstrate ability to complete end range right cervical spine rotation plus extension without increased symptoms to improve his ability to complete work activities.  Baseline: most strongly reproduces symptoms (01/07/23); Goal status: In-progress   4.  Patient will demonstrate cervical spine AROM rotation equal or greater than 60 degrees bilaterally to improve ability to check blind spot when driving.  Baseline: R/L 40/50 (01/07/23); Goal status: In-progress   5.  Patient will complete community, work and/or recreational activities with 75% less limitation due to current condition.  Baseline: difficulty with work and quality of life  (01/07/23); Goal status: In-progress       PLAN:   PT FREQUENCY: 1-2x/week   PT DURATION: 12 weeks   PLANNED INTERVENTIONS: Therapeutic exercises, Therapeutic activity, Neuromuscular re-education, Patient/Family education, Self Care, Joint mobilization, Dry Needling, Electrical stimulation, Spinal mobilization, Cryotherapy, Moist heat, Manual therapy, and Re-evaluation   PLAN FOR NEXT SESSION: Progressive neck/postural/functional strengthening, motor control, ROM, and stretching exercises as tolerated. Manual therapy and dry needling to decrease muscle tension and joint mobility. Education.    Cira Rue, PT, DPT 01/13/2023, 3:10 PM  [facility] [address] [place], [place] [address] P: [phone] I F: [id number]

## 2023-01-13 ENCOUNTER — Ambulatory Visit: Payer: 59 | Admitting: Physical Therapy

## 2023-01-13 ENCOUNTER — Encounter: Payer: Self-pay | Admitting: Physical Therapy

## 2023-01-13 VITALS — BP 123/81 | HR 70

## 2023-01-13 DIAGNOSIS — M542 Cervicalgia: Secondary | ICD-10-CM

## 2023-01-13 DIAGNOSIS — M792 Neuralgia and neuritis, unspecified: Secondary | ICD-10-CM

## 2023-01-15 ENCOUNTER — Encounter: Payer: Self-pay | Admitting: Physical Therapy

## 2023-01-15 ENCOUNTER — Ambulatory Visit: Payer: 59 | Admitting: Physical Therapy

## 2023-01-15 DIAGNOSIS — M792 Neuralgia and neuritis, unspecified: Secondary | ICD-10-CM

## 2023-01-15 DIAGNOSIS — M542 Cervicalgia: Secondary | ICD-10-CM | POA: Diagnosis not present

## 2023-01-15 NOTE — Therapy (Signed)
OUTPATIENT PHYSICAL THERAPY  TREATMENT NOTE   Patient Name: Rodney Benjamin MRN: [id number] DOB:[birth year], 63 y.o., male Today's Date: 01/15/2023  END OF SESSION:  PT End of Session - 01/15/23 1415     Visit Number 3    Number of Visits 13    Date for PT Re-Evaluation 04/01/23    Authorization Type [employer] reporting period from 01/07/2023    Authorization Time Period VL: 60 per cal yr    Authorization - Visit Number 3    Authorization - Number of Visits 60    Progress Note Due on Visit 10    PT Start Time 1300    PT Stop Time 1343    PT Time Calculation (min) 43 min    Activity Tolerance Patient tolerated treatment well    Behavior During Therapy WFL for tasks assessed/performed               Past Medical History:  Diagnosis Date   History of chicken pox    Hypercholesterolemia    Hypertension    Past Surgical History:  Procedure Laterality Date   INNER EAR SURGERY     cholesteatoma removed x 2   KNEE SURGERY  1982   right   TONSILLECTOMY     Patient Active Problem List   Diagnosis Date Noted   Facial pain 01/10/2023   Ear pain 11/24/2022   Neck pain 08/22/2022   Tick bite of right hip 06/26/2022   Colon cancer screening 06/22/2021   Leg skin lesion, left 12/25/2019   Hemorrhoid 12/09/2018   Headache 12/15/2017   GERD (gastroesophageal reflux disease) 04/25/2017   Tachycardia 04/23/2017   CKD (chronic kidney disease), stage III (HCC) 10/12/2016   Stress 01/26/2016   Loss of weight 01/31/2015   Cervical disc herniation 12/25/2014   Back pain 10/24/2014   Health care maintenance 10/24/2014   Fluttering heart 06/19/2014   Left arm numbness 06/19/2014   Renal cyst 06/12/2013   Hypercholesterolemia 06/06/2012   Hypertension 03/23/2012    PCP: [place], MD   REFERRING PROVIDER: Loni Dolly, MP (neurosurgery)   REFERRING DIAG: cervical radiculopathy   THERAPY DIAG:  Cervicalgia  Neuralgia and neuritis    Rationale for  Evaluation and Treatment: Rehabilitation   ONSET DATE: Approx February 2024   PERTINENT HISTORY:  Patient is a 62 y.o. male who presents to outpatient physical therapy with a referral for medical diagnosis cervical radiculopathy. This patient's chief complaints consist of right sided neck pain with radiation to right upper trap and radiation of paresthesia into the top of the right side of the head, leading to the following functional deficits: difficulty working (doesn't feel as sharp), decreased quality of life. Patient has Hypertension; Hypercholesterolemia; Renal cyst; Fluttering heart; Left arm numbness; Back pain; Health care maintenance; Cervical disc herniation; Loss of weight; Stress; CKD (chronic kidney disease), stage III (HCC); Tachycardia; GERD (gastroesophageal reflux disease); Headache; Hemorrhoid; Leg skin lesion, left; Tick bite of right hip; Neck pain; and Ear pain on their problem list. He  has a past medical history of History of Hypercholesterolemia, and Hypertension. He  has a past surgical history that includes Knee surgery (1982); Inner ear surgery; Tonsillectomy, C4-7 ACDF with Dr. Rise Mu 01/09/2015, surgery on the right ear 11/24/2022 for recurrent dermoid cyst abutting dehiscent sigmoid, meatoplasty performed and area around the sigmoid plate drilled down to exteriorize the cholesteatoma - matrix left over the sigmoid sinus due to risk of hemorrhage with removal. Patient denies hx of cancer, stroke,  seizures, lung problems, heart problems, diabetes, unexplained weight loss, unexplained changes in bowel or bladder problems, unexplained stumbling or dropping things, and osteoporosis.   SUBJECTIVE:                                                                                                                                                                                                          SUBJECTIVE STATEMENT: Patient states he has been sore since last PT session, mostly in  his right upper trap. He has not noticed a big change in his head pain either. He states it flaired up quite a bit last  night so he did some of the exercises but eventually put some headphones on that helps with the pressure to the right ear. He states he only has muscle soreness in the right UT now. He is willing to do dry needling again today.    PAIN:  NPRS: no numeric pain rating provided, reports "muscle soreness" in the right upper trap and "a little tingling at the right side of the head.    PRECAUTIONS: keep the packing in his ear.      PATIENT GOALS: "get rid of the pain"   NEXT MD VISIT: no appointment scheduled currently   OBJECTIVE    TODAY'S TREATMENT:   Therapeutic exercise: to centralize symptoms and improve ROM, strength, muscular endurance, and activity tolerance required for successful completion of functional activities.  - Upper body ergometer level 5 to encourage joint nutrition, warm tissue, induce analgesic effect of aerobic exercise, improve muscular strength and endurance,  and prepare for remainder of session. (Manual therapy / dry needling - see below) - Sidelying open book (thoracic rotation) to improve thoracic, shoulder girdle, and upper trunk mobility. 1x10 with 5 second hold on each side  (Further manual therapy  - see below).  - seated R rotation SNAG with overpressure, 1x10 with 5 seconds (no change in symptoms) - Suboccipital and Economist towards right, 1x5 - Education on HEP including handout   Manual therapy: to reduce pain and tissue tension, improve range of motion, neuromodulation, in order to promote improved ability to complete functional activities. PRONE - STM to right upper trap and levator scap region posterior neck muscles including suboccipital and soft tissue surrounding R ear.  - CPA grade III-IV at each segment of the cervical spine, ~ 5 reps per segment.  HOOKLYING - OA flexion mobilization, 3x20-30 seconds grade III,  right side (reproduces pain/paresthesia) - flexion right rotation mobilization, 2x10-20 seconds (to tolerance, reproduces pain/paresthesia).  - flexion left rotation  mobilization, 1x20 seconds (no increase in pain)  Modality: Dry needling performed to right upper trap to decrease pain and spasms along patient's neck and  region with patient in prone utilizing 3 dry needle(s) .25mm x 40mm with 4 sticks along right upper trap. Patient educated about the risks and benefits from therapy and verbally consents to treatment. Patient had strong twitch response at right upper trap.  Dry needling performed by Luretha Murphy. Ilsa Iha PT, DPT who is certified in this technique.  Pt required multimodal cuing for proper technique and to facilitate improved neuromuscular control, strength, range of motion, and functional ability resulting in improved performance and form.  PATIENT EDUCATION:  Education details: Exercise purpose/form. Self management techniques. Education on HEP including handout.  Person educated: Patient Education method: Explanation, Demonstration, Tactile cues, Verbal cues, and Handouts Education comprehension: verbalized understanding, returned demonstration, tactile cues required, and needs further education   HOME EXERCISE PROGRAM: Access Code: [email or web address] URL: [email or web address] Date: 01/13/2023 Prepared by: Norton Blizzard  Exercises - Seated Levator Scapulae Stretch  - 1-2 x daily - 3 sets - 30 seconds hold - Seated Upper Trapezius Stretch  - 1-2 x daily - 3 sets - 30 seconds hold - Shoulder External Rotation and Scapular Retraction with Resistance  - 1 x daily - 3 sets - 15 reps - Low Trap Setting at Wall  - 1 x daily - 3 sets - 10 reps  HOME EXERCISE PROGRAM [[email or web address]] View at [email or web address]" using code: [id number] Suboccipital and Economist  -  Repeat 10 Repetitions, Hold 5 Seconds, Perform 2 Times a Day   ASSESSMENT:   CLINICAL IMPRESSION: Patient  arrives reporting a lot of soreness in the right upper trap and continued head pain/paresthesia. Today's PT session utilized dry needling at the right upper trap again and also some joint mobilizations to the OA joint that reproduced patient's paresthesia in the head. Updated HEP with exercise to address this region. Patient reported soreness sin the UT but not worsening of head symptoms by end of session. Patient would benefit from continued management of limiting condition by skilled physical therapist to address remaining impairments and functional limitations to work towards stated goals and return to PLOF or maximal functional independence.    From initial PT evaluation 01/07/2023:  Patient is a 62 y.o. male referred to outpatient physical therapy with a medical diagnosis of cervical radiculopathy who presents with signs and symptoms consistent with chronic right sided neck pain with radiation into right side of head and into upper trap. Patient's pain appears to be referring from tight and tender portions of right upper trap, cervical paraspinals, and SCM. No radiation identified from suboccipital region at initial eval. No reproduction of distal symptoms with Spurling's part B test at initial eval.  Patient does have significant hypomobility in thoracic and cervical spine with increased forward head posture that may also be contributing to symptoms. Patient presents with significant pain, ROM, joint stiffness, muscle tension, posture, paresthesia, muscle performance (strength/power/endurance), and activity tolerance impairments that are limiting ability to complete work and daily activities without difficulty and negatively effects quality of life. Patient will benefit from skilled physical therapy intervention to address current body structure impairments and activity limitations to improve function and work towards goals set in current POC in order to return to prior level of function or maximal  functional improvement.    OBJECTIVE IMPAIRMENTS: decreased activity tolerance, decreased knowledge of condition, decreased mobility, decreased ROM, hypomobility, increased muscle spasms, impaired flexibility, impaired tone,  improper body mechanics, postural dysfunction, and pain.    ACTIVITY LIMITATIONS: working, quality of life   PARTICIPATION LIMITATIONS: occupation   PERSONAL FACTORS: Time since onset of injury/illness/exacerbation and 3+ comorbidities: Hypertension; Hypercholesterolemia; Renal cyst; Fluttering heart; Left arm numbness; Back pain; Health care maintenance; Cervical disc herniation; Loss of weight; Stress; CKD (chronic kidney disease), stage III (HCC); Tachycardia; GERD (gastroesophageal reflux disease); Headache; Hemorrhoid; Leg skin lesion, left; Tick bite of right hip; Neck pain; and Ear pain on their problem list. He  has a past medical history of History of Hypercholesterolemia, and Hypertension. He  has a past surgical history that includes Knee surgery (1982); Inner ear surgery; Tonsillectomy, C4-7 ACDF with Dr. Rise Mu 01/09/2015, surgery on the right ear 11/24/2022 for recurrent dermoid cyst abutting dehiscent sigmoid, meatoplasty performed and area around the sigmoid plate drilled down to exteriorize the cholesteatoma - matrix left over the sigmoid sinus due to risk of hemorrhage with removal are also affecting patient's functional outcome.    REHAB POTENTIAL: Good   CLINICAL DECISION MAKING: Evolving/moderate complexity   EVALUATION COMPLEXITY: Moderate     GOALS: Goals reviewed with patient? No  SHORT TERM GOALS: Target date: 01/21/2023   Patient will be independent with initial home exercise program for self-management of symptoms. Baseline: Initial HEP provided at IE (01/07/23); Goal status: In-progress     LONG TERM GOALS: Target date: 04/01/2023   Patient will be independent with a long-term home exercise program for self-management of symptoms.  Baseline:  Initial HEP provided at IE (01/07/23); Goal status: In-progress   2.  Patient will demonstrate improved FOTO to equal or greater than 75 by visit #10 to demonstrate improvement in overall condition and self-reported functional ability.  Baseline: 70 (01/07/23); Goal status: In-progress   3.  Patient will demonstrate ability to complete end range right cervical spine rotation plus extension without increased symptoms to improve his ability to complete work activities.  Baseline: most strongly reproduces symptoms (01/07/23); Goal status: In-progress   4.  Patient will demonstrate cervical spine AROM rotation equal or greater than 60 degrees bilaterally to improve ability to check blind spot when driving.  Baseline: R/L 40/50 (01/07/23); Goal status: In-progress   5.  Patient will complete community, work and/or recreational activities with 75% less limitation due to current condition.  Baseline: difficulty with work and quality of life (01/07/23); Goal status: In-progress       PLAN:   PT FREQUENCY: 1-2x/week   PT DURATION: 12 weeks   PLANNED INTERVENTIONS: Therapeutic exercises, Therapeutic activity, Neuromuscular re-education, Patient/Family education, Self Care, Joint mobilization, Dry Needling, Electrical stimulation, Spinal mobilization, Cryotherapy, Moist heat, Manual therapy, and Re-evaluation   PLAN FOR NEXT SESSION: Progressive neck/postural/functional strengthening, motor control, ROM, and stretching exercises as tolerated. Manual therapy and dry needling to decrease muscle tension and joint mobility. Education.    Cira Rue, PT, DPT 01/15/2023, 2:36 PM  [facility] [address] [place], [place] [address] P: [phone] I F: [phone]

## 2023-01-20 ENCOUNTER — Ambulatory Visit: Payer: 59 | Admitting: Physical Therapy

## 2023-01-20 ENCOUNTER — Encounter: Payer: Self-pay | Admitting: Physical Therapy

## 2023-01-20 DIAGNOSIS — M792 Neuralgia and neuritis, unspecified: Secondary | ICD-10-CM

## 2023-01-20 DIAGNOSIS — M542 Cervicalgia: Secondary | ICD-10-CM

## 2023-01-20 NOTE — Therapy (Signed)
OUTPATIENT PHYSICAL THERAPY  TREATMENT NOTE   Patient Name: Rodney Benjamin MRN: [id number] DOB:19-Oct-1960, 62 y.o., male Today's Date: 01/20/2023  END OF SESSION:  PT End of Session - 01/20/23 1521     Visit Number 4    Number of Visits 13    Date for PT Re-Evaluation 04/01/23    Authorization Type [employer] reporting period from 01/07/2023    Authorization Time Period VL: 60 per cal yr    Authorization - Visit Number 4    Authorization - Number of Visits 60    Progress Note Due on Visit 10    PT Start Time 1518    PT Stop Time 1556    PT Time Calculation (min) 38 min    Activity Tolerance Patient tolerated treatment well    Behavior During Therapy WFL for tasks assessed/performed                Past Medical History:  Diagnosis Date   History of chicken pox    Hypercholesterolemia    Hypertension    Past Surgical History:  Procedure Laterality Date   INNER EAR SURGERY     cholesteatoma removed x 2   KNEE SURGERY  1982   right   TONSILLECTOMY     Patient Active Problem List   Diagnosis Date Noted   Facial pain 01/10/2023   Ear pain 11/24/2022   Neck pain 08/22/2022   Tick bite of right hip 06/26/2022   Colon cancer screening 06/22/2021   Leg skin lesion, left 12/25/2019   Hemorrhoid 12/09/2018   Headache 12/15/2017   GERD (gastroesophageal reflux disease) 04/25/2017   Tachycardia 04/23/2017   CKD (chronic kidney disease), stage III (HCC) 10/12/2016   Stress 01/26/2016   Loss of weight 01/31/2015   Cervical disc herniation 12/25/2014   Back pain 10/24/2014   Health care maintenance 10/24/2014   Fluttering heart 06/19/2014   Left arm numbness 06/19/2014   Renal cyst 06/12/2013   Hypercholesterolemia 06/06/2012   Hypertension 03/23/2012    PCP: Dale Howard, MD   REFERRING PROVIDER: Loni Dolly, MP (neurosurgery)   REFERRING DIAG: cervical radiculopathy   THERAPY DIAG:  Cervicalgia  Neuralgia and neuritis    Rationale for  Evaluation and Treatment: Rehabilitation   ONSET DATE: Approx February 2024   PERTINENT HISTORY:  Patient is a 62 y.o. male who presents to outpatient physical therapy with a referral for medical diagnosis cervical radiculopathy. This patient's chief complaints consist of right sided neck pain with radiation to right upper trap and radiation of paresthesia into the top of the right side of the head, leading to the following functional deficits: difficulty working (doesn't feel as sharp), decreased quality of life. Patient has Hypertension; Hypercholesterolemia; Renal cyst; Fluttering heart; Left arm numbness; Back pain; Health care maintenance; Cervical disc herniation; Loss of weight; Stress; CKD (chronic kidney disease), stage III (HCC); Tachycardia; GERD (gastroesophageal reflux disease); Headache; Hemorrhoid; Leg skin lesion, left; Tick bite of right hip; Neck pain; and Ear pain on their problem list. He  has a past medical history of History of Hypercholesterolemia, and Hypertension. He  has a past surgical history that includes Knee surgery (1982); Inner ear surgery; Tonsillectomy, C4-7 ACDF with Dr. Rise Mu 01/09/2015, surgery on the right ear 11/24/2022 for recurrent dermoid cyst abutting dehiscent sigmoid, meatoplasty performed and area around the sigmoid plate drilled down to exteriorize the cholesteatoma - matrix left over the sigmoid sinus due to risk of hemorrhage with removal. Patient denies hx of cancer,  stroke, seizures, lung problems, heart problems, diabetes, unexplained weight loss, unexplained changes in bowel or bladder problems, unexplained stumbling or dropping things, and osteoporosis.   SUBJECTIVE:                                                                                                                                                                                                          SUBJECTIVE STATEMENT: Patient states he had the packing taken out of his right ear today.  His surgeon said it looked good. He states he will know tomorrow if that affects the soreness at his right ear from removing the packing. He states his concordant symptoms that he is coming to PT for continue to fluctuate but may be a little better. He states it felt the best on Saturday when he was doing physical work but was not as good on Sunday. Today it is feeling pretty good. He states the new HEP went well.    PAIN:  NPRS: 3/10 radiating up from the right ear.   PRECAUTIONS: keep the packing in his ear.      PATIENT GOALS: "get rid of the pain"   NEXT MD VISIT: no appointment scheduled currently   OBJECTIVE    TODAY'S TREATMENT:   Therapeutic exercise: to centralize symptoms and improve ROM, strength, muscular endurance, and activity tolerance required for successful completion of functional activities.  - Upper body ergometer level 5 to encourage joint nutrition, warm tissue, induce analgesic effect of aerobic exercise, improve muscular strength and endurance,  and prepare for remainder of session. (Manual therapy / dry needling - see below)  Manual therapy: to reduce pain and tissue tension, improve range of motion, neuromodulation, in order to promote improved ability to complete functional activities. PRONE/HOOKLYING - STM to bilateral suboccipital region, right upper trap and levator scap region with sustained pressure with and without IASTM assist (palpable relaxation under fingers), and right SCM.   HOOKLYING - Cervical spine flexion rotation mobilization, 3x20-30 seconds each side (R most strongly reproduces head pain/paresthesia).  - STM with sustained pressure to right temporalis muscle  Modality: Dry needling performed to B suboccipital muscles and along right upper trap to decrease pain and spasms along patient's neck and head region with patient in prone utilizing 1/3 dry needle(s) .30/.92mm x 30/19mm with 1 stick at each side of suboccipitals and 5 sticks at right upper  trap. Patient educated about the risks and benefits from therapy and verbally consents to treatment. Patient had strong twitch response at right upper trap.  Dry needling performed by Huntley Dec  Henrietta Hoover PT, DPT who is certified in this technique.  Pt required multimodal cuing for proper technique and to facilitate improved neuromuscular control, strength, range of motion, and functional ability resulting in improved performance and form.  PATIENT EDUCATION:  Education details: Exercise purpose/form. Self management techniques. Education on HEP including handout.  Person educated: Patient Education method: Explanation, Demonstration, Tactile cues, Verbal cues, and Handouts Education comprehension: verbalized understanding, returned demonstration, tactile cues required, and needs further education   HOME EXERCISE PROGRAM: Access Code: [email or web address] URL: [email or web address] Date: 01/13/2023 Prepared by: Norton Blizzard  Exercises - Seated Levator Scapulae Stretch  - 1-2 x daily - 3 sets - 30 seconds hold - Seated Upper Trapezius Stretch  - 1-2 x daily - 3 sets - 30 seconds hold - Shoulder External Rotation and Scapular Retraction with Resistance  - 1 x daily - 3 sets - 15 reps - Low Trap Setting at Wall  - 1 x daily - 3 sets - 10 reps  HOME EXERCISE PROGRAM [[email or web address]] View at [email or web address]" using code: [id number] Suboccipital and Economist  -  Repeat 10 Repetitions, Hold 5 Seconds, Perform 2 Times a Day   ASSESSMENT:   CLINICAL IMPRESSION: Patient arrives reporting continued concordant symptoms with some possible improvement. Today's session continued to focus on dry needling and manual therapy at the locations of the right upper trap that reproduce concordant head pain and upper cervical spine rotational mobilizations to improve motion and decrease sensitivity and concordant sign. Patient had several strong twitch responses with dry needling to the right UT and  palpable softening of tissue with sustained pressure to tight bands in the muscle. He demonstrated improved R cervical spine rotation ROM following mobilizations to the upper cervical spine. He reported decreased symptoms by end of session but some soreness from the dry needling that is to be expected. Patient would benefit from continued management of limiting condition by skilled physical therapist to address remaining impairments and functional limitations to work towards stated goals and return to PLOF or maximal functional independence.   From initial PT evaluation 01/07/2023:  Patient is a 62 y.o. male referred to outpatient physical therapy with a medical diagnosis of cervical radiculopathy who presents with signs and symptoms consistent with chronic right sided neck pain with radiation into right side of head and into upper trap. Patient's pain appears to be referring from tight and tender portions of right upper trap, cervical paraspinals, and SCM. No radiation identified from suboccipital region at initial eval. No reproduction of distal symptoms with Spurling's part B test at initial eval.  Patient does have significant hypomobility in thoracic and cervical spine with increased forward head posture that may also be contributing to symptoms. Patient presents with significant pain, ROM, joint stiffness, muscle tension, posture, paresthesia, muscle performance (strength/power/endurance), and activity tolerance impairments that are limiting ability to complete work and daily activities without difficulty and negatively effects quality of life. Patient will benefit from skilled physical therapy intervention to address current body structure impairments and activity limitations to improve function and work towards goals set in current POC in order to return to prior level of function or maximal functional improvement.    OBJECTIVE IMPAIRMENTS: decreased activity tolerance, decreased knowledge of condition,  decreased mobility, decreased ROM, hypomobility, increased muscle spasms, impaired flexibility, impaired tone, improper body mechanics, postural dysfunction, and pain.    ACTIVITY LIMITATIONS: working, quality of life   PARTICIPATION LIMITATIONS: occupation   PERSONAL FACTORS: Time since onset of injury/illness/exacerbation and 3+  comorbidities: Hypertension; Hypercholesterolemia; Renal cyst; Fluttering heart; Left arm numbness; Back pain; Health care maintenance; Cervical disc herniation; Loss of weight; Stress; CKD (chronic kidney disease), stage III (HCC); Tachycardia; GERD (gastroesophageal reflux disease); Headache; Hemorrhoid; Leg skin lesion, left; Tick bite of right hip; Neck pain; and Ear pain on their problem list. He  has a past medical history of History of Hypercholesterolemia, and Hypertension. He  has a past surgical history that includes Knee surgery (1982); Inner ear surgery; Tonsillectomy, C4-7 ACDF with Dr. Rise Mu 01/09/2015, surgery on the right ear 11/24/2022 for recurrent dermoid cyst abutting dehiscent sigmoid, meatoplasty performed and area around the sigmoid plate drilled down to exteriorize the cholesteatoma - matrix left over the sigmoid sinus due to risk of hemorrhage with removal are also affecting patient's functional outcome.    REHAB POTENTIAL: Good   CLINICAL DECISION MAKING: Evolving/moderate complexity   EVALUATION COMPLEXITY: Moderate     GOALS: Goals reviewed with patient? No  SHORT TERM GOALS: Target date: 01/21/2023   Patient will be independent with initial home exercise program for self-management of symptoms. Baseline: Initial HEP provided at IE (01/07/23); Goal status: In-progress     LONG TERM GOALS: Target date: 04/01/2023   Patient will be independent with a long-term home exercise program for self-management of symptoms.  Baseline: Initial HEP provided at IE (01/07/23); Goal status: In-progress   2.  Patient will demonstrate improved FOTO to  equal or greater than 75 by visit #10 to demonstrate improvement in overall condition and self-reported functional ability.  Baseline: 70 (01/07/23); Goal status: In-progress   3.  Patient will demonstrate ability to complete end range right cervical spine rotation plus extension without increased symptoms to improve his ability to complete work activities.  Baseline: most strongly reproduces symptoms (01/07/23); Goal status: In-progress   4.  Patient will demonstrate cervical spine AROM rotation equal or greater than 60 degrees bilaterally to improve ability to check blind spot when driving.  Baseline: R/L 40/50 (01/07/23); Goal status: In-progress   5.  Patient will complete community, work and/or recreational activities with 75% less limitation due to current condition.  Baseline: difficulty with work and quality of life (01/07/23); Goal status: In-progress       PLAN:   PT FREQUENCY: 1-2x/week   PT DURATION: 12 weeks   PLANNED INTERVENTIONS: Therapeutic exercises, Therapeutic activity, Neuromuscular re-education, Patient/Family education, Self Care, Joint mobilization, Dry Needling, Electrical stimulation, Spinal mobilization, Cryotherapy, Moist heat, Manual therapy, and Re-evaluation   PLAN FOR NEXT SESSION: Progressive neck/postural/functional strengthening, motor control, ROM, and stretching exercises as tolerated. Manual therapy and dry needling to decrease muscle tension and joint mobility. Education.    Cira Rue, PT, DPT 01/20/2023, 7:01 PM  [facility] [address] [place], [place] [address] P: [phone] I F: [phone]

## 2023-01-26 ENCOUNTER — Encounter: Payer: Self-pay | Admitting: Physical Therapy

## 2023-01-26 ENCOUNTER — Ambulatory Visit: Payer: 59 | Attending: Nurse Practitioner | Admitting: Physical Therapy

## 2023-01-26 DIAGNOSIS — M542 Cervicalgia: Secondary | ICD-10-CM | POA: Insufficient documentation

## 2023-01-26 DIAGNOSIS — M792 Neuralgia and neuritis, unspecified: Secondary | ICD-10-CM | POA: Insufficient documentation

## 2023-01-26 NOTE — Therapy (Signed)
OUTPATIENT PHYSICAL THERAPY  TREATMENT NOTE   Patient Name: Rodney Benjamin MRN: [id number] DOB:01-08-1961, 62 y.o., male Today's Date: 01/26/2023  END OF SESSION:  PT End of Session - 01/26/23 1038     Visit Number 5    Number of Visits 13    Date for PT Re-Evaluation 04/01/23    Authorization Type [employer] reporting period from 01/07/2023    Authorization Time Period VL: 60 per cal yr    Authorization - Visit Number 5    Authorization - Number of Visits 60    Progress Note Due on Visit 10    PT Start Time 1028    PT Stop Time 1110    PT Time Calculation (min) 42 min    Activity Tolerance Patient tolerated treatment well    Behavior During Therapy WFL for tasks assessed/performed                 Past Medical History:  Diagnosis Date   History of chicken pox    Hypercholesterolemia    Hypertension    Past Surgical History:  Procedure Laterality Date   INNER EAR SURGERY     cholesteatoma removed x 2   KNEE SURGERY  1982   right   TONSILLECTOMY     Patient Active Problem List   Diagnosis Date Noted   Facial pain 01/10/2023   Ear pain 11/24/2022   Neck pain 08/22/2022   Tick bite of right hip 06/26/2022   Colon cancer screening 06/22/2021   Leg skin lesion, left 12/25/2019   Hemorrhoid 12/09/2018   Headache 12/15/2017   GERD (gastroesophageal reflux disease) 04/25/2017   Tachycardia 04/23/2017   CKD (chronic kidney disease), stage III (HCC) 10/12/2016   Stress 01/26/2016   Loss of weight 01/31/2015   Cervical disc herniation 12/25/2014   Back pain 10/24/2014   Health care maintenance 10/24/2014   Fluttering heart 06/19/2014   Left arm numbness 06/19/2014   Renal cyst 06/12/2013   Hypercholesterolemia 06/06/2012   Hypertension 03/23/2012    PCP: Dale Ollie, MD   REFERRING PROVIDER: Loni Dolly, MP (neurosurgery)   REFERRING DIAG: cervical radiculopathy   THERAPY DIAG:  Cervicalgia  Neuralgia and neuritis    Rationale for  Evaluation and Treatment: Rehabilitation   ONSET DATE: Approx February 2024   PERTINENT HISTORY:  Patient is a 62 y.o. male who presents to outpatient physical therapy with a referral for medical diagnosis cervical radiculopathy. This patient's chief complaints consist of right sided neck pain with radiation to right upper trap and radiation of paresthesia into the top of the right side of the head, leading to the following functional deficits: difficulty working (doesn't feel as sharp), decreased quality of life. Patient has Hypertension; Hypercholesterolemia; Renal cyst; Fluttering heart; Left arm numbness; Back pain; Health care maintenance; Cervical disc herniation; Loss of weight; Stress; CKD (chronic kidney disease), stage III (HCC); Tachycardia; GERD (gastroesophageal reflux disease); Headache; Hemorrhoid; Leg skin lesion, left; Tick bite of right hip; Neck pain; and Ear pain on their problem list. He  has a past medical history of History of Hypercholesterolemia, and Hypertension. He  has a past surgical history that includes Knee surgery (1982); Inner ear surgery; Tonsillectomy, C4-7 ACDF with Dr. Rise Mu 01/09/2015, surgery on the right ear 11/24/2022 for recurrent dermoid cyst abutting dehiscent sigmoid, meatoplasty performed and area around the sigmoid plate drilled down to exteriorize the cholesteatoma - matrix left over the sigmoid sinus due to risk of hemorrhage with removal. Patient denies hx of  cancer, stroke, seizures, lung problems, heart problems, diabetes, unexplained weight loss, unexplained changes in bowel or bladder problems, unexplained stumbling or dropping things, and osteoporosis.   SUBJECTIVE:                                                                                                                                                                                                          SUBJECTIVE STATEMENT: Patient states he had a couple of good days Friday and Saturday  when he was off from work and outside working. Yesterday he was less active because of church and it was an uncomfortable day. He states he was sore from dry needling last PT session. He states he noticed some intermittant tingling in his 5th digit in the left hand. 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 He notices after he gets up. Patient states he would not like to delay further imaging when it is appropriate to get it.    PAIN:  NPRS: 2/10 radiating up from the right ear.   PRECAUTIONS: keep the packing in his ear.      PATIENT GOALS: "get rid of the pain"   NEXT MD VISIT: no appointment scheduled currently   OBJECTIVE  SELF-REPORTED FUNCTION FOTO score: 72/100 (neck questionnaire)  AROM   UPPER LIMB NEURODYNAMIC TESTS (AROM) Upper Limb Tension Test 1 (ULTT1, Median nerve bias, Magee-ULTT1):  R = negative L  = negative Upper Limb Tension Test 3 (ULTT3, Ulnar nerve bias, Magee-ULTT4):  R = negative  L = negative    TODAY'S TREATMENT:   Therapeutic exercise: to centralize symptoms and improve ROM, strength, muscular endurance, and activity tolerance required for successful completion of functional activities.  - Upper body ergometer level 7 to encourage joint nutrition, warm tissue, induce analgesic effect of aerobic exercise, improve muscular strength and endurance,  and prepare for remainder of session. 5 minutes.  - AROM ULTT (see above).  - Sidelying open book (thoracic rotation) to improve thoracic, shoulder girdle, and upper trunk mobility. 1x20 each side.  - kneeling prayer stretch for thoracic extension/shoulder flexion, 1x20 holding PVC stick to prevent shoulder IR, and 1x20 with prayer hands. 5 second holds. (Started getting sore at superior shoulders).  - quadruped thoracic rotation 1x10 each side with hand behind head and 1x10 each side with hand behind low  back.  - Education on HEP including handout   Manual therapy: to reduce pain  and tissue tension, improve range of motion, neuromodulation, in order to promote improved ability to complete functional activities. PRONE - CPA with and without KE wedge at thoracic segments to T10. More reps at higher levels.   Pt required multimodal cuing for proper technique and to facilitate improved neuromuscular control, strength, range of motion, and functional ability resulting in improved performance and form.  PATIENT EDUCATION:  Education details: Exercise purpose/form. Self management techniques. Education on HEP including handout.  Person educated: Patient Education method: Explanation, Demonstration, Tactile cues, Verbal cues, and Handouts Education comprehension: verbalized understanding, returned demonstration, tactile cues required, and needs further education   HOME EXERCISE PROGRAM: Access Code: [email or web address] URL: [email or web address] Date: 01/26/2023 Prepared by: Norton Blizzard  Exercises - Seated Levator Scapulae Stretch  - 1-2 x daily - 3 sets - 30 seconds hold - Seated Upper Trapezius Stretch  - 1-2 x daily - 3 sets - 30 seconds hold - Shoulder External Rotation and Scapular Retraction with Resistance  - 1 x daily - 3 sets - 15 reps - Low Trap Setting at Wall  - 1 x daily - 3 sets - 10 reps - Quadruped Thoracic Rotation Full Range with Hand on Neck  - 3-5 x weekly - 2 sets - 10 reps - Quadruped Thoracic Rotation with Hand on Low Back  - 3-5 x weekly - 2 sets - 10 reps  Patient Education - Scientist, physiological - Ergonomics for Neck Discomfort - Introduction to Ergonomics  HOME EXERCISE PROGRAM [[id number]] View at [email or web address]" using code: [id number] Suboccipital and Economist  -  Repeat 10 Repetitions, Hold 5 Seconds, Perform 2 Times a Day  HOME EXERCISE PROGRAM [[id number]] View at "[email or web address]" using code: [id number] Prayer Stretch -  Repeat 20  Repetitions, Hold 5 Seconds, Complete 2 Sets, Perform 1 Times a Day   ASSESSMENT:   CLINICAL IMPRESSION: Patient arrives reporting continued concordant symptoms when still that improves with movement and some additional intermittent paresthesia on the left 5th digit that started since last PT session. Today's session focused more on exercises and manual therapy for improved thoracic spine mobility and education on posture and ergonomics to improve cervical spine posture that could be irritating condition, especially with prolonged sitting at work. Patient tolerated interventions well and reported decreased (consistent with feeling better with activity) by end of session. Plan to continue with mobility, ROM exercises and manual therapy/dry needling as appropriate next session. Patient would benefit from continued management of limiting condition by skilled physical therapist to address remaining impairments and functional limitations to work towards stated goals and return to PLOF or maximal functional independence.   From initial PT evaluation 01/07/2023:  Patient is a 62 y.o. male referred to outpatient physical therapy with a medical diagnosis of cervical radiculopathy who presents with signs and symptoms consistent with chronic right sided neck pain with radiation into right side of head and into upper trap. Patient's pain appears to be referring from tight and tender portions of right upper trap, cervical paraspinals, and SCM. No radiation identified from suboccipital region at initial eval. No reproduction of distal symptoms with Spurling's part B test at initial eval.  Patient does have significant hypomobility in thoracic and cervical spine with increased forward head posture that may also be contributing to symptoms. Patient presents with significant pain, ROM, joint stiffness, muscle tension, posture, paresthesia, muscle performance (strength/power/endurance), and activity tolerance impairments that  are limiting ability to complete work and daily activities without difficulty and negatively effects quality of life.  Patient will benefit from skilled physical therapy intervention to address current body structure impairments and activity limitations to improve function and work towards goals set in current POC in order to return to prior level of function or maximal functional improvement.    OBJECTIVE IMPAIRMENTS: decreased activity tolerance, decreased knowledge of condition, decreased mobility, decreased ROM, hypomobility, increased muscle spasms, impaired flexibility, impaired tone, improper body mechanics, postural dysfunction, and pain.    ACTIVITY LIMITATIONS: working, quality of life   PARTICIPATION LIMITATIONS: occupation   PERSONAL FACTORS: Time since onset of injury/illness/exacerbation and 3+ comorbidities: Hypertension; Hypercholesterolemia; Renal cyst; Fluttering heart; Left arm numbness; Back pain; Health care maintenance; Cervical disc herniation; Loss of weight; Stress; CKD (chronic kidney disease), stage III (HCC); Tachycardia; GERD (gastroesophageal reflux disease); Headache; Hemorrhoid; Leg skin lesion, left; Tick bite of right hip; Neck pain; and Ear pain on their problem list. He  has a past medical history of History of Hypercholesterolemia, and Hypertension. He  has a past surgical history that includes Knee surgery (1982); Inner ear surgery; Tonsillectomy, C4-7 ACDF with Dr. Rise Mu 01/09/2015, surgery on the right ear 11/24/2022 for recurrent dermoid cyst abutting dehiscent sigmoid, meatoplasty performed and area around the sigmoid plate drilled down to exteriorize the cholesteatoma - matrix left over the sigmoid sinus due to risk of hemorrhage with removal are also affecting patient's functional outcome.    REHAB POTENTIAL: Good   CLINICAL DECISION MAKING: Evolving/moderate complexity   EVALUATION COMPLEXITY: Moderate     GOALS: Goals reviewed with patient? No  SHORT  TERM GOALS: Target date: 01/21/2023   Patient will be independent with initial home exercise program for self-management of symptoms. Baseline: Initial HEP provided at IE (01/07/23); Goal status: In-progress     LONG TERM GOALS: Target date: 04/01/2023   Patient will be independent with a long-term home exercise program for self-management of symptoms.  Baseline: Initial HEP provided at IE (01/07/23); Goal status: In-progress   2.  Patient will demonstrate improved FOTO to equal or greater than 75 by visit #10 to demonstrate improvement in overall condition and self-reported functional ability.  Baseline: 70 (01/07/23); Goal status: In-progress   3.  Patient will demonstrate ability to complete end range right cervical spine rotation plus extension without increased symptoms to improve his ability to complete work activities.  Baseline: most strongly reproduces symptoms (01/07/23); Goal status: In-progress   4.  Patient will demonstrate cervical spine AROM rotation equal or greater than 60 degrees bilaterally to improve ability to check blind spot when driving.  Baseline: R/L 40/50 (01/07/23); Goal status: In-progress   5.  Patient will complete community, work and/or recreational activities with 75% less limitation due to current condition.  Baseline: difficulty with work and quality of life (01/07/23); Goal status: In-progress       PLAN:   PT FREQUENCY: 1-2x/week   PT DURATION: 12 weeks   PLANNED INTERVENTIONS: Therapeutic exercises, Therapeutic activity, Neuromuscular re-education, Patient/Family education, Self Care, Joint mobilization, Dry Needling, Electrical stimulation, Spinal mobilization, Cryotherapy, Moist heat, Manual therapy, and Re-evaluation   PLAN FOR NEXT SESSION: Progressive neck/postural/functional strengthening, motor control, ROM, and stretching exercises as tolerated. Manual therapy and dry needling to decrease muscle tension and joint mobility. Education.     Cira Rue, PT, DPT 01/26/2023, 3:06 PM  [facility] [address] [place], [place] [address] P: [phone] I F: [phone]

## 2023-01-28 ENCOUNTER — Encounter: Payer: Self-pay | Admitting: Internal Medicine

## 2023-01-28 ENCOUNTER — Ambulatory Visit (INDEPENDENT_AMBULATORY_CARE_PROVIDER_SITE_OTHER): Payer: 59 | Admitting: Internal Medicine

## 2023-01-28 ENCOUNTER — Ambulatory Visit: Payer: 59 | Admitting: Physical Therapy

## 2023-01-28 ENCOUNTER — Encounter: Payer: Self-pay | Admitting: Physical Therapy

## 2023-01-28 VITALS — BP 124/72 | HR 72 | Temp 97.9°F | Resp 16 | Ht 73.0 in | Wt 221.0 lb

## 2023-01-28 DIAGNOSIS — I10 Essential (primary) hypertension: Secondary | ICD-10-CM | POA: Diagnosis not present

## 2023-01-28 DIAGNOSIS — M542 Cervicalgia: Secondary | ICD-10-CM

## 2023-01-28 DIAGNOSIS — M792 Neuralgia and neuritis, unspecified: Secondary | ICD-10-CM

## 2023-01-28 DIAGNOSIS — M5020 Other cervical disc displacement, unspecified cervical region: Secondary | ICD-10-CM

## 2023-01-28 DIAGNOSIS — R519 Headache, unspecified: Secondary | ICD-10-CM

## 2023-01-28 DIAGNOSIS — Z0000 Encounter for general adult medical examination without abnormal findings: Secondary | ICD-10-CM

## 2023-01-28 DIAGNOSIS — E7800 Pure hypercholesterolemia, unspecified: Secondary | ICD-10-CM

## 2023-01-28 DIAGNOSIS — Z125 Encounter for screening for malignant neoplasm of prostate: Secondary | ICD-10-CM

## 2023-01-28 DIAGNOSIS — N1831 Chronic kidney disease, stage 3a: Secondary | ICD-10-CM

## 2023-01-28 DIAGNOSIS — R200 Anesthesia of skin: Secondary | ICD-10-CM

## 2023-01-28 LAB — BASIC METABOLIC PANEL
BUN: 19 mg/dL (ref 6–23)
CO2: 28 mEq/L (ref 19–32)
Calcium: 9.3 mg/dL (ref 8.4–10.5)
Chloride: 104 mEq/L (ref 96–112)
Creatinine, Ser: 1.43 mg/dL (ref 0.40–1.50)
GFR: 52.46 mL/min — ABNORMAL LOW (ref >60.00)
Glucose, Bld: 88 mg/dL (ref 70–99)
Potassium: 4.1 mEq/L (ref 3.5–5.1)
Sodium: 139 mEq/L (ref 135–145)

## 2023-01-28 LAB — LIPID PANEL
Cholesterol: 139 mg/dL (ref 0–200)
HDL: 38.6 mg/dL — ABNORMAL LOW (ref >39.00)
LDL Cholesterol: 84 mg/dL (ref 0–99)
NonHDL: 99.98
Total CHOL/HDL Ratio: 4
Triglycerides: 78 mg/dL (ref 0.0–149.0)
VLDL: 15.6 mg/dL (ref 0.0–40.0)

## 2023-01-28 LAB — HEPATIC FUNCTION PANEL
ALT: 21 U/L (ref 0–53)
AST: 21 U/L (ref 0–37)
Albumin: 4.4 g/dL (ref 3.5–5.2)
Alkaline Phosphatase: 52 U/L (ref 39–117)
Bilirubin, Direct: 0.2 mg/dL (ref 0.0–0.3)
Total Bilirubin: 0.9 mg/dL (ref 0.2–1.2)
Total Protein: 7.2 g/dL (ref 6.0–8.3)

## 2023-01-28 LAB — PSA: PSA: 2.23 ng/mL (ref 0.10–4.00)

## 2023-01-28 NOTE — Assessment & Plan Note (Signed)
Neck pain and right side head sensation change as outlined.  Discussed possible etiologies.  Appears to be aggravated by certain position changes as outlined.  Appears to be more msk in origin.  Saw ENT as outlined - clear.  Will obtain c-spine MRI as outined.  Has tried medication and PT.

## 2023-01-28 NOTE — Progress Notes (Signed)
Subjective:    Patient ID: Rodney Benjamin, male    DOB: Oct 23, 1960, 62 y.o.   MRN: [id number]  Patient here for  Chief Complaint  Patient presents with   Annual Exam    HPI Here for a physical exam.  Has a history of right cholesteatoma s/p modified radical mastoidectomy with meatoplasty in 2019 and revision tympanomastoidectomy in 2020 with TORP and cartilage graft. Is s/p surgery on the right ear 11/2022 for recurrent dermoid cyst. Saw [facility] 11/19/22 - for evaluation of pain right ear and surrounding area. Also pain in the face and occipital nerve territory. Pain worse with turning his head to the right. Recommended referral to PT.  Has been going to PT. Working on shoulders - to help loosen up.  Feels may have aggravated some.  Does report the "full head" sensation may be a little better.  Saw ENT for f/u 01/20/23.  Recommended to discontinue ear drops and application of gentin violet to the area of inflammation.  Does report numbness - left fingers to elbow.  Worse when first stands or first gets up - may last approximately 15-20 seconds.  Resolves. Persistent sensation change right side face.  Increased neck pain with rotation of head.  Blood pressure doing well - outside checks 121/75 and 121/80.    Past Medical History:  Diagnosis Date   History of chicken pox    Hypercholesterolemia    Hypertension    Past Surgical History:  Procedure Laterality Date   INNER EAR SURGERY     cholesteatoma removed x 2   KNEE SURGERY  1982   right   TONSILLECTOMY     Family History  Problem Relation Age of Onset   Prostate cancer Other    Hypertension Other    Breast cancer Other    Social History   Socioeconomic History   Marital status: Married    Spouse name: Not on file   Number of children: Not on file   Years of education: Not on file   Highest education level: Not on file  Occupational History   Not on file  Tobacco Use   Smoking status: Never   Smokeless tobacco: Never   Substance and Sexual Activity   Alcohol use: No    Alcohol/week: 0.0 standard drinks of alcohol   Drug use: No   Sexual activity: Not on file  Other Topics Concern   Not on file  Social History Narrative   Not on file   Social Determinants of Health   Financial Resource Strain: Not on file  Food Insecurity: Not on file  Transportation Needs: Not on file  Physical Activity: Not on file  Stress: Not on file  Social Connections: Not on file     Review of Systems  Constitutional:  Negative for appetite change and unexpected weight change.  HENT:  Negative for congestion, sinus pressure and sore throat.   Eyes:  Negative for pain and visual disturbance.  Respiratory:  Negative for cough, chest tightness and shortness of breath.   Cardiovascular:  Negative for chest pain, palpitations and leg swelling.  Gastrointestinal:  Negative for abdominal pain, diarrhea, nausea and vomiting.  Genitourinary:  Negative for difficulty urinating and dysuria.  Musculoskeletal:  Positive for neck pain. Negative for myalgias.       Arm/finger - numbness.    Skin:  Negative for color change and rash.  Neurological:  Negative for dizziness and headaches.  Hematological:  Negative for adenopathy. Does not bruise/bleed easily.  Psychiatric/Behavioral:  Negative for agitation and dysphoric mood.        Objective:     BP 124/72   Pulse 72   Temp 97.9 F (36.6 C)   Resp 16   Ht 6\' 1"  (1.854 m)   Wt 221 lb (100.2 kg)   SpO2 97%   BMI 29.16 kg/m  Wt Readings from Last 3 Encounters:  01/28/23 221 lb (100.2 kg)  01/07/23 231 lb 9.6 oz (105.1 kg)  08/22/22 224 lb (101.6 kg)    Physical Exam Constitutional:      General: He is not in acute distress.    Appearance: Normal appearance. He is well-developed.  HENT:     Head: Normocephalic and atraumatic.     Right Ear: External ear normal.     Left Ear: External ear normal.  Eyes:     General: No scleral icterus.       Right eye: No  discharge.        Left eye: No discharge.     Conjunctiva/sclera: Conjunctivae normal.  Neck:     Thyroid: No thyromegaly.  Cardiovascular:     Rate and Rhythm: Normal rate and regular rhythm.  Pulmonary:     Effort: No respiratory distress.     Breath sounds: Normal breath sounds. No wheezing.  Abdominal:     General: Bowel sounds are normal.     Palpations: Abdomen is soft.     Tenderness: There is no abdominal tenderness.  Musculoskeletal:        General: No swelling or tenderness.     Cervical back: Neck supple. No tenderness.     Comments: Grip strength- normal.    Lymphadenopathy:     Cervical: No cervical adenopathy.  Skin:    Findings: No erythema or rash.  Neurological:     Mental Status: He is alert and oriented to person, place, and time.  Psychiatric:        Mood and Affect: Mood normal.        Behavior: Behavior normal.      Outpatient Encounter Medications as of 01/28/2023  Medication Sig   amLODipine (NORVASC) 5 MG tablet Take 1 tablet (5 mg total) by mouth daily.   doxycycline (MONODOX) 50 MG capsule Take 50 mg by mouth 2 (two) times daily.   metroNIDAZOLE (METROGEL) 1 % gel Apply topically daily.   No facility-administered encounter medications on file as of 01/28/2023.     Lab Results  Component Value Date   WBC 6.2 12/29/2022   HGB 14.6 12/29/2022   HCT 45 12/29/2022   PLT 157 12/29/2022   GLUCOSE 88 01/28/2023   CHOL 139 01/28/2023   TRIG 78.0 01/28/2023   HDL 38.60 (L) 01/28/2023   LDLCALC 84 01/28/2023   ALT 21 01/28/2023   AST 21 01/28/2023   NA 139 01/28/2023   K 4.1 01/28/2023   CL 104 01/28/2023   CREATININE 1.43 01/28/2023   BUN 19 01/28/2023   CO2 28 01/28/2023   TSH 1.31 07/18/2022   PSA 2.23 01/28/2023    MR Brain Wo Contrast  Result Date: 09/17/2022 CLINICAL DATA:  Provided history: Headache, new onset. EXAM: MRI HEAD WITHOUT CONTRAST TECHNIQUE: Multiplanar, multiecho pulse sequences of the brain and surrounding structures  were obtained without intravenous contrast. COMPARISON:  Report from head CT 12/12/1998 (images unavailable). FINDINGS: Brain: No age advanced or lobar predominant parenchymal atrophy. Mild multifocal T2 FLAIR hyperintensity within the cerebral white matter, nonspecific. There is no acute infarct. No  evidence of an intracranial mass. No chronic intracranial blood products. No extra-axial fluid collection. No midline shift. Vascular: Maintained flow voids within the proximal large arterial vessels. Skull and upper cervical spine: No focal suspicious marrow lesion. Sinuses/Orbits: No mass or acute finding within the imaged orbits. Small mucous retention cysts within the right maxillary sinus. Other: Prior right mastoidectomy with postoperative changes to the right middle, incompletely assessed by MR modality. Nonspecific 9 mm T2 hyperintense focus within remaining right mastoid air cells (for instance as seen on series 10, image 7). 9 mm Tornwaldt/retention cyst within the posterior nasopharynx. IMPRESSION: 1. No evidence of an acute intracranial abnormality. 2. Mild multifocal T2 FLAIR hyperintense signal abnormality within the cerebral white matter. These signal changes are nonspecific, but most often secondary to chronic small vessel ischemia. Alternative considerations include sequelae of chronic migraine headaches, sequelae of a prior infectious/inflammatory process or sequelae of a demyelinating process (such as multiple sclerosis), among others. 3. Prior right mastoidectomy with postoperative changes to the right middle ear, incompletely assessed by MR modality. Nonspecific 9 mm focus of T2 hyperintense signal abnormality within remaining right mastoid air cells. A temporal bone CT may be obtained for further characterization, as clinically warranted. 4. Small mucous retention cysts within the right maxillary sinus. Electronically Signed   By: Jackey Loge D.O.   On: 09/17/2022 15:46       Assessment &  Plan:  Routine general medical examination at a health care facility  Health care maintenance Assessment & Plan: Physical today 01/28/23.  Had colonoscopy 2022.  Need results.  Check psa today.    Primary hypertension Assessment & Plan: On amlodipine.   Continue amlodipine.  Follow pressures.  Follow metabolic panel.   Orders: -     Basic metabolic panel  Hypercholesterolemia Assessment & Plan: Have discussed calculated cholesterol risk.  He has desired to hold on starting cholesterol medication.  Low cholesterol diet and exercise.  Follow lipid panel. Check lipid panel today.   Orders: -     Lipid panel -     Hepatic function panel  Prostate cancer screening -     PSA  Cervical disc herniation Assessment & Plan: Is s/p discectomy and fusion.  Saw [facility] 11/19/22 - for evaluation of pain right ear and surrounding area. Also pain in the face and occipital nerve territory. Pain worse with turning his head to the right. Recommended referral to PT and if persistent, recommended MRI - cervical.  Has been going to PT.  Feels may be aggravating - symptoms as outlined.  Numbness arm/fingers. Given symptoms and history of neck issues as outlined, obtain MRI c-spine to further evaluate.   Orders: -     MR CERVICAL SPINE WO CONTRAST; Future  Stage 3a chronic kidney disease (HCC) Assessment & Plan: Stay hydrated. Avoid antiinflammatories.  Follow metabolic panel.    Facial pain Assessment & Plan: Saw [facility] 11/19/22 - for evaluation of pain right ear and surrounding area. Also pain in the face and occipital nerve territory. Pain worse with turning his head to the right. Recommended referral to PT. If persistent, recommended MRI - cervical. Going to therapy.  Feels may be aggravating symptoms.  Given persistent symptoms, discussed possible occipital neuralgia, trigeminal neuralgia, etc.  Discussed neurology referral. Obtain MRI - c-spine.    Left arm numbness Assessment & Plan: Left arm  numbness and finger numbness as outlined.  Aggravated by therapy.  Check MRI c-spine.    Orders: -     MR  CERVICAL SPINE WO CONTRAST; Future  Neck pain Assessment & Plan: Neck pain and right side head sensation change as outlined.  Discussed possible etiologies.  Appears to be aggravated by certain position changes as outlined.  Appears to be more msk in origin.  Saw ENT as outlined - clear.  Will obtain c-spine MRI as outined.  Has tried medication and PT.    Orders: -     MR CERVICAL SPINE WO CONTRAST; Future  Cervicalgia -     MR CERVICAL SPINE WO CONTRAST; Future     Dale [place], MD

## 2023-01-28 NOTE — Therapy (Signed)
OUTPATIENT PHYSICAL THERAPY  TREATMENT NOTE   Patient Name: Rodney Benjamin MRN: [id number] DOB:Feb 06, 1961, 62 y.o., male Today's Date: 01/28/2023  END OF SESSION:  PT End of Session - 01/28/23 1035     Visit Number 6    Number of Visits 13    Date for PT Re-Evaluation 04/01/23    Authorization Type [employer] reporting period from 01/07/2023    Authorization Time Period VL: 60 per cal yr    Authorization - Visit Number 6    Authorization - Number of Visits 60    Progress Note Due on Visit 10    PT Start Time 1032    PT Stop Time 1110    PT Time Calculation (min) 38 min    Activity Tolerance Patient tolerated treatment well    Behavior During Therapy WFL for tasks assessed/performed              Past Medical History:  Diagnosis Date   History of chicken pox    Hypercholesterolemia    Hypertension    Past Surgical History:  Procedure Laterality Date   INNER EAR SURGERY     cholesteatoma removed x 2   KNEE SURGERY  1982   right   TONSILLECTOMY     Patient Active Problem List   Diagnosis Date Noted   Facial pain 01/10/2023   Ear pain 11/24/2022   Neck pain 08/22/2022   Tick bite of right hip 06/26/2022   Colon cancer screening 06/22/2021   Leg skin lesion, left 12/25/2019   Hemorrhoid 12/09/2018   Headache 12/15/2017   GERD (gastroesophageal reflux disease) 04/25/2017   Tachycardia 04/23/2017   CKD (chronic kidney disease), stage III (HCC) 10/12/2016   Stress 01/26/2016   Loss of weight 01/31/2015   Cervical disc herniation 12/25/2014   Back pain 10/24/2014   Health care maintenance 10/24/2014   Fluttering heart 06/19/2014   Left arm numbness 06/19/2014   Renal cyst 06/12/2013   Hypercholesterolemia 06/06/2012   Hypertension 03/23/2012    PCP: Dale [place], MD   REFERRING PROVIDER: Loni Dolly, MP (neurosurgery)   REFERRING DIAG: cervical radiculopathy   THERAPY DIAG:  Cervicalgia  Neuralgia and neuritis    Rationale for Evaluation  and Treatment: Rehabilitation   ONSET DATE: Approx February 2024   PERTINENT HISTORY:  Patient is a 62 y.o. male who presents to outpatient physical therapy with a referral for medical diagnosis cervical radiculopathy. This patient's chief complaints consist of right sided neck pain with radiation to right upper trap and radiation of paresthesia into the top of the right side of the head, leading to the following functional deficits: difficulty working (doesn't feel as sharp), decreased quality of life. Patient has Hypertension; Hypercholesterolemia; Renal cyst; Fluttering heart; Left arm numbness; Back pain; Health care maintenance; Cervical disc herniation; Loss of weight; Stress; CKD (chronic kidney disease), stage III (HCC); Tachycardia; GERD (gastroesophageal reflux disease); Headache; Hemorrhoid; Leg skin lesion, left; Tick bite of right hip; Neck pain; and Ear pain on their problem list. He  has a past medical history of History of Hypercholesterolemia, and Hypertension. He  has a past surgical history that includes Knee surgery (1982); Inner ear surgery; Tonsillectomy, C4-7 ACDF with Dr. Rise Mu 01/09/2015, surgery on the right ear 11/24/2022 for recurrent dermoid cyst abutting dehiscent sigmoid, meatoplasty performed and area around the sigmoid plate drilled down to exteriorize the cholesteatoma - matrix left over the sigmoid sinus due to risk of hemorrhage with removal. Patient denies hx of cancer, stroke, seizures,  lung problems, heart problems, diabetes, unexplained weight loss, unexplained changes in bowel or bladder problems, unexplained stumbling or dropping things, and osteoporosis.   SUBJECTIVE:                                                                                                                                                                                                          SUBJECTIVE STATEMENT: Patient states he had a pretty good afternoon after last PT session. He had a  lot of head symptoms last night and continues to have intermittent left hand tingling, now in his thumb. He is noticing his neck is really stiff and tight on his left side. He saw his PCP today and everything routine checked out okay, but his PCP is ordering an MRI for his neck since she is already in PT. He states his head symptoms are better this  morning compared to last night and he is not currently experiencing left hand symptoms.    PAIN:  NPRS: 2/10 radiating up from the right ear.   PRECAUTIONS: keep the packing in his ear.      PATIENT GOALS: "get rid of the pain"   NEXT MD VISIT: no appointment scheduled currently   OBJECTIVE    TODAY'S TREATMENT:   Therapeutic exercise: to centralize symptoms and improve ROM, strength, muscular endurance, and activity tolerance required for successful completion of functional activities.  - Upper body ergometer level 7 to encourage joint nutrition, warm tissue, induce analgesic effect of aerobic exercise, improve muscular strength and endurance,  and prepare for remainder of session. 6 minutes.  - seated (with lumbar roll) cervical retraction AROM, 2x10,(slight improvement in tension and ROM left rotation, no L UE with standing), 1x20 (no further improvement in symptoms or ROM) - seated (with lumbar roll) cervical retraction AROM with self overpressure, 2x20 reps (improving left rotation and sensation of tightness) - Sidelying open book (thoracic rotation) to improve thoracic, shoulder girdle, and upper trunk mobility. 1x20 each side. 3#DB in top hand, top knee flexed and bottom knee extended to better anchor lower body.  - kneeling prayer stretch for thoracic extension/shoulder flexion, 1x20 holding PVC stick to prevent shoulder IR, and 1x20 with prayer hands. 5 second holds. - Education on HEP including handout   Pt required multimodal cuing for proper technique and to facilitate improved neuromuscular control, strength, range of motion, and  functional ability resulting in improved performance and form.  PATIENT EDUCATION:  Education details: Exercise purpose/form. Self management techniques. Education on HEP including handout.  Person educated: Patient Education method: Explanation,  Demonstration, Tactile cues, Verbal cues, and Handouts Education comprehension: verbalized understanding, returned demonstration, tactile cues required, and needs further education   HOME EXERCISE PROGRAM: Access Code: [email or web address] URL: [email or web address] Date: 01/26/2023 Prepared by: Norton Blizzard  Exercises - Seated Levator Scapulae Stretch  - 1-2 x daily - 3 sets - 30 seconds hold - Seated Upper Trapezius Stretch  - 1-2 x daily - 3 sets - 30 seconds hold - Shoulder External Rotation and Scapular Retraction with Resistance  - 1 x daily - 3 sets - 15 reps - Low Trap Setting at Wall  - 1 x daily - 3 sets - 10 reps - Quadruped Thoracic Rotation Full Range with Hand on Neck  - 3-5 x weekly - 2 sets - 10 reps - Quadruped Thoracic Rotation with Hand on Low Back  - 3-5 x weekly - 2 sets - 10 reps  Patient Education - Scientist, physiological - Ergonomics for Neck Discomfort - Introduction to Ergonomics  HOME EXERCISE PROGRAM [[id number]] View at [email or web address]" using code: [id number] Suboccipital and Economist  -  Repeat 10 Repetitions, Hold 5 Seconds, Perform 2 Times a Day  HOME EXERCISE PROGRAM [[id number]] View at "[email or web address]" using code: [id number] Prayer Stretch -  Repeat 20 Repetitions, Hold 5 Seconds, Complete 2 Sets, Perform 1 Times a Day   ASSESSMENT:   CLINICAL IMPRESSION: Patient arrives reporting continued concordant symptoms  and intermittent tingling now in his left thumb. Trial of repeated motions progressed to cervical retraction with self-overpressure improved sensation of tightness and AROM with left rotation and patient did not experience paresthesia when he expected to. Added this to HEP for trial  over time. Continued with thoracic mobility exercises with patient reporting improved ability to complete open book without increased symptoms to right side of head. Patient would benefit from continued management of limiting condition by skilled physical therapist to address remaining impairments and functional limitations to work towards stated goals and return to PLOF or maximal functional independence.   From initial PT evaluation 01/07/2023:  Patient is a 62 y.o. male referred to outpatient physical therapy with a medical diagnosis of cervical radiculopathy who presents with signs and symptoms consistent with chronic right sided neck pain with radiation into right side of head and into upper trap. Patient's pain appears to be referring from tight and tender portions of right upper trap, cervical paraspinals, and SCM. No radiation identified from suboccipital region at initial eval. No reproduction of distal symptoms with Spurling's part B test at initial eval.  Patient does have significant hypomobility in thoracic and cervical spine with increased forward head posture that may also be contributing to symptoms. Patient presents with significant pain, ROM, joint stiffness, muscle tension, posture, paresthesia, muscle performance (strength/power/endurance), and activity tolerance impairments that are limiting ability to complete work and daily activities without difficulty and negatively effects quality of life. Patient will benefit from skilled physical therapy intervention to address current body structure impairments and activity limitations to improve function and work towards goals set in current POC in order to return to prior level of function or maximal functional improvement.    OBJECTIVE IMPAIRMENTS: decreased activity tolerance, decreased knowledge of condition, decreased mobility, decreased ROM, hypomobility, increased muscle spasms, impaired flexibility, impaired tone, improper body mechanics,  postural dysfunction, and pain.    ACTIVITY LIMITATIONS: working, quality of life   PARTICIPATION LIMITATIONS: occupation   PERSONAL FACTORS: Time since onset of injury/illness/exacerbation and 3+ comorbidities: Hypertension; Hypercholesterolemia; Renal cyst; Fluttering heart; Left arm numbness; Back pain;  Health care maintenance; Cervical disc herniation; Loss of weight; Stress; CKD (chronic kidney disease), stage III (HCC); Tachycardia; GERD (gastroesophageal reflux disease); Headache; Hemorrhoid; Leg skin lesion, left; Tick bite of right hip; Neck pain; and Ear pain on their problem list. He  has a past medical history of History of Hypercholesterolemia, and Hypertension. He  has a past surgical history that includes Knee surgery (1982); Inner ear surgery; Tonsillectomy, C4-7 ACDF with Dr. Rise Mu 01/09/2015, surgery on the right ear 11/24/2022 for recurrent dermoid cyst abutting dehiscent sigmoid, meatoplasty performed and area around the sigmoid plate drilled down to exteriorize the cholesteatoma - matrix left over the sigmoid sinus due to risk of hemorrhage with removal are also affecting patient's functional outcome.    REHAB POTENTIAL: Good   CLINICAL DECISION MAKING: Evolving/moderate complexity   EVALUATION COMPLEXITY: Moderate     GOALS: Goals reviewed with patient? No  SHORT TERM GOALS: Target date: 01/21/2023   Patient will be independent with initial home exercise program for self-management of symptoms. Baseline: Initial HEP provided at IE (01/07/23); Goal status: In-progress     LONG TERM GOALS: Target date: 04/01/2023   Patient will be independent with a long-term home exercise program for self-management of symptoms.  Baseline: Initial HEP provided at IE (01/07/23); Goal status: In-progress   2.  Patient will demonstrate improved FOTO to equal or greater than 75 by visit #10 to demonstrate improvement in overall condition and self-reported functional ability.  Baseline:  70 (01/07/23); Goal status: In-progress   3.  Patient will demonstrate ability to complete end range right cervical spine rotation plus extension without increased symptoms to improve his ability to complete work activities.  Baseline: most strongly reproduces symptoms (01/07/23); Goal status: In-progress   4.  Patient will demonstrate cervical spine AROM rotation equal or greater than 60 degrees bilaterally to improve ability to check blind spot when driving.  Baseline: R/L 40/50 (01/07/23); Goal status: In-progress   5.  Patient will complete community, work and/or recreational activities with 75% less limitation due to current condition.  Baseline: difficulty with work and quality of life (01/07/23); Goal status: In-progress       PLAN:   PT FREQUENCY: 1-2x/week   PT DURATION: 12 weeks   PLANNED INTERVENTIONS: Therapeutic exercises, Therapeutic activity, Neuromuscular re-education, Patient/Family education, Self Care, Joint mobilization, Dry Needling, Electrical stimulation, Spinal mobilization, Cryotherapy, Moist heat, Manual therapy, and Re-evaluation   PLAN FOR NEXT SESSION: Progressive neck/postural/functional strengthening, motor control, ROM, and stretching exercises as tolerated. Manual therapy and dry needling to decrease muscle tension and joint mobility. Education.    Cira Rue, PT, DPT 01/28/2023, 11:11 AM  [facility] [address] [place], [place] [address] P: [phone] I F: [phone]

## 2023-01-28 NOTE — Assessment & Plan Note (Signed)
Saw [facility] 11/19/22 - for evaluation of pain right ear and surrounding area. Also pain in the face and occipital nerve territory. Pain worse with turning his head to the right. Recommended referral to PT. If persistent, recommended MRI - cervical. Going to therapy.  Feels may be aggravating symptoms.  Given persistent symptoms, discussed possible occipital neuralgia, trigeminal neuralgia, etc.  Discussed neurology referral. Obtain MRI - c-spine.

## 2023-01-28 NOTE — Assessment & Plan Note (Signed)
Have discussed calculated cholesterol risk.  He has desired to hold on starting cholesterol medication.  Low cholesterol diet and exercise.  Follow lipid panel. Check lipid panel today.  

## 2023-01-28 NOTE — Assessment & Plan Note (Signed)
Stay hydrated.  Avoid antiinflammatories.  Follow metabolic panel.

## 2023-01-28 NOTE — Assessment & Plan Note (Signed)
Physical today 01/28/23.  Had colonoscopy 2022.  Need results.  Check psa today.

## 2023-01-28 NOTE — Assessment & Plan Note (Signed)
Is s/p discectomy and fusion.  Saw [facility] 11/19/22 - for evaluation of pain right ear and surrounding area. Also pain in the face and occipital nerve territory. Pain worse with turning his head to the right. Recommended referral to PT and if persistent, recommended MRI - cervical.  Has been going to PT.  Feels may be aggravating - symptoms as outlined.  Numbness arm/fingers. Given symptoms and history of neck issues as outlined, obtain MRI c-spine to further evaluate.

## 2023-01-28 NOTE — Assessment & Plan Note (Signed)
Left arm numbness and finger numbness as outlined.  Aggravated by therapy.  Check MRI c-spine.

## 2023-01-28 NOTE — Assessment & Plan Note (Signed)
On amlodipine.   Continue amlodipine.  Follow pressures.  Follow metabolic panel.

## 2023-02-03 ENCOUNTER — Ambulatory Visit: Payer: 59 | Admitting: Physical Therapy

## 2023-02-03 ENCOUNTER — Encounter: Payer: Self-pay | Admitting: Physical Therapy

## 2023-02-03 DIAGNOSIS — M542 Cervicalgia: Secondary | ICD-10-CM | POA: Diagnosis not present

## 2023-02-03 DIAGNOSIS — M792 Neuralgia and neuritis, unspecified: Secondary | ICD-10-CM

## 2023-02-03 NOTE — Therapy (Signed)
OUTPATIENT PHYSICAL THERAPY  TREATMENT NOTE   Patient Name: Rodney Benjamin MRN: [id number] DOB:Dec 14, 1960, 62 y.o., male Today's Date: 02/03/2023  END OF SESSION:  PT End of Session - 02/03/23 1358     Visit Number 7    Number of Visits 13    Date for PT Re-Evaluation 04/01/23    Authorization Type [employer] reporting period from 01/07/2023    Authorization Time Period VL: 60 per cal yr    Authorization - Visit Number 7    Authorization - Number of Visits 60    Progress Note Due on Visit 10    PT Start Time 1120    PT Stop Time 1200    PT Time Calculation (min) 40 min    Activity Tolerance Patient tolerated treatment well    Behavior During Therapy WFL for tasks assessed/performed               Past Medical History:  Diagnosis Date   History of chicken pox    Hypercholesterolemia    Hypertension    Past Surgical History:  Procedure Laterality Date   INNER EAR SURGERY     cholesteatoma removed x 2   KNEE SURGERY  1982   right   TONSILLECTOMY     Patient Active Problem List   Diagnosis Date Noted   Facial pain 01/10/2023   Ear pain 11/24/2022   Neck pain 08/22/2022   Tick bite of right hip 06/26/2022   Colon cancer screening 06/22/2021   Leg skin lesion, left 12/25/2019   Hemorrhoid 12/09/2018   Headache 12/15/2017   GERD (gastroesophageal reflux disease) 04/25/2017   Tachycardia 04/23/2017   CKD (chronic kidney disease), stage III (HCC) 10/12/2016   Stress 01/26/2016   Loss of weight 01/31/2015   Cervical disc herniation 12/25/2014   Back pain 10/24/2014   Health care maintenance 10/24/2014   Fluttering heart 06/19/2014   Left arm numbness 06/19/2014   Renal cyst 06/12/2013   Hypercholesterolemia 06/06/2012   Hypertension 03/23/2012    PCP: Dale [place], MD   REFERRING PROVIDER: Loni Dolly, MP (neurosurgery)   REFERRING DIAG: cervical radiculopathy   THERAPY DIAG:  Cervicalgia  Neuralgia and neuritis    Rationale for  Evaluation and Treatment: Rehabilitation   ONSET DATE: Approx February 2024   PERTINENT HISTORY:  Patient is a 62 y.o. male who presents to outpatient physical therapy with a referral for medical diagnosis cervical radiculopathy. This patient's chief complaints consist of right sided neck pain with radiation to right upper trap and radiation of paresthesia into the top of the right side of the head, leading to the following functional deficits: difficulty working (doesn't feel as sharp), decreased quality of life. Patient has Hypertension; Hypercholesterolemia; Renal cyst; Fluttering heart; Left arm numbness; Back pain; Health care maintenance; Cervical disc herniation; Loss of weight; Stress; CKD (chronic kidney disease), stage III (HCC); Tachycardia; GERD (gastroesophageal reflux disease); Headache; Hemorrhoid; Leg skin lesion, left; Tick bite of right hip; Neck pain; and Ear pain on their problem list. He  has a past medical history of History of Hypercholesterolemia, and Hypertension. He  has a past surgical history that includes Knee surgery (1982); Inner ear surgery; Tonsillectomy, C4-7 ACDF with Dr. Rise Mu 01/09/2015, surgery on the right ear 11/24/2022 for recurrent dermoid cyst abutting dehiscent sigmoid, meatoplasty performed and area around the sigmoid plate drilled down to exteriorize the cholesteatoma - matrix left over the sigmoid sinus due to risk of hemorrhage with removal. Patient denies hx of cancer, stroke,  seizures, lung problems, heart problems, diabetes, unexplained weight loss, unexplained changes in bowel or bladder problems, unexplained stumbling or dropping things, and osteoporosis.   SUBJECTIVE:                                                                                                                                                                                                          SUBJECTIVE STATEMENT: Patient states he had a really good day yesterday while working but  he is having more symptoms today. He continues to have intermittent tingling in his left hand for about 15 seconds when he stands up after prolonged sitting. He states he felt fine after last PT session. He has been doing his HEP and it seems to help after he does the exercises. He has not noticed any improvement in the left hand but his head seems to be better after the HEP. He saw his PCP since last PT session who ordered a cervical spine MRI. Saturday he was working all day, and he didn't notice anything in his arm. He states his head symptoms include the feeling like a muscle or nerve is affected near his right zygomatic arch which makes his eye feel tired.    PAIN:  NPRS: 2-3/10 radiating up from the right ear.   PRECAUTIONS: keep the packing in his ear.      PATIENT GOALS: "get rid of the pain"   NEXT MD VISIT: no appointment scheduled currently   OBJECTIVE    TODAY'S TREATMENT:   Therapeutic exercise: to centralize symptoms and improve ROM, strength, muscular endurance, and activity tolerance required for successful completion of functional activities.  - Upper body ergometer level 7 to encourage joint nutrition, warm tissue, induce analgesic effect of aerobic exercise, improve muscular strength and endurance,  and prepare for remainder of session. 6 minutes.  - seated (with lumbar roll) cervical retraction with OP 1x10, (Manual therapy - see below) - Sidelying open book (thoracic rotation) to improve thoracic, shoulder girdle, and upper trunk mobility. 1x20 each side. 3#DB in top hand, top knee flexed and bottom knee extended to better anchor lower body. - kneeling prayer stretch for thoracic extension/shoulder flexion, 1x20 holding PVC stick to prevent shoulder IR, and 1x20 with prayer hands. 5 second holds.  Manual therapy: to reduce pain and tissue tension, improve range of motion, neuromodulation, in order to promote improved ability to complete functional activities. SEATED with  lumbar roll - cervical retraction with clinician overpressure, 4x6 (pop in jaw with no change in jaw pain or motion during set 3). Possible  mild improvement in pain with right rotation but patient unsure if it is really an improvement.  PRONE with head in cradle, pillow under ankles.  - CPA with KE wedge to upper to mid thoracic spine, grade III-IV (cavitation at approx T8)   Pt required multimodal cuing for proper technique and to facilitate improved neuromuscular control, strength, range of motion, and functional ability resulting in improved performance and form.  PATIENT EDUCATION:  Education details: Exercise purpose/form. Self management techniques. Education on HEP including handout.  Person educated: Patient Education method: Explanation, Demonstration, Tactile cues, Verbal cues, and Handouts Education comprehension: verbalized understanding, returned demonstration, tactile cues required, and needs further education   HOME EXERCISE PROGRAM: Access Code: [email or web address] URL: [email or web address] Date: 01/26/2023 Prepared by: Norton Blizzard  Exercises - Seated Levator Scapulae Stretch  - 1-2 x daily - 3 sets - 30 seconds hold - Seated Upper Trapezius Stretch  - 1-2 x daily - 3 sets - 30 seconds hold - Shoulder External Rotation and Scapular Retraction with Resistance  - 1 x daily - 3 sets - 15 reps - Low Trap Setting at Wall  - 1 x daily - 3 sets - 10 reps - Quadruped Thoracic Rotation Full Range with Hand on Neck  - 3-5 x weekly - 2 sets - 10 reps - Quadruped Thoracic Rotation with Hand on Low Back  - 3-5 x weekly - 2 sets - 10 reps  Patient Education - Scientist, physiological - Ergonomics for Neck Discomfort - Introduction to Ergonomics  HOME EXERCISE PROGRAM [[id number]] View at "[email or web address]" using code: [id number] Suboccipital and Economist  -  Repeat 10 Repetitions, Hold 5 Seconds, Perform 2 Times a Day  HOME EXERCISE PROGRAM [[id number]] View at  "[email or web address]" using code: [id number] Prayer Stretch -  Repeat 20 Repetitions, Hold 5 Seconds, Complete 2 Sets, Perform 1 Times a Day   ASSESSMENT:   CLINICAL IMPRESSION: Patient arrives reporting some improvement in head symptoms but continued intermittent left hand numbness. Continued with force progression for extension directional preference with possible mild improvement in right cervical spine rotation. Continued with additional manual therapy and exercises to improve thoracic and cervical spine mobility. Patient tolerated session well with no increase in pain by end of session.  Patient would benefit from continued management of limiting condition by skilled physical therapist to address remaining impairments and functional limitations to work towards stated goals and return to PLOF or maximal functional independence.   From initial PT evaluation 01/07/2023:  Patient is a 62 y.o. male referred to outpatient physical therapy with a medical diagnosis of cervical radiculopathy who presents with signs and symptoms consistent with chronic right sided neck pain with radiation into right side of head and into upper trap. Patient's pain appears to be referring from tight and tender portions of right upper trap, cervical paraspinals, and SCM. No radiation identified from suboccipital region at initial eval. No reproduction of distal symptoms with Spurling's part B test at initial eval.  Patient does have significant hypomobility in thoracic and cervical spine with increased forward head posture that may also be contributing to symptoms. Patient presents with significant pain, ROM, joint stiffness, muscle tension, posture, paresthesia, muscle performance (strength/power/endurance), and activity tolerance impairments that are limiting ability to complete work and daily activities without difficulty and negatively effects quality of life. Patient will benefit from skilled physical therapy intervention to  address current body structure impairments and activity limitations to improve function and work towards goals set in current POC in order  to return to prior level of function or maximal functional improvement.    OBJECTIVE IMPAIRMENTS: decreased activity tolerance, decreased knowledge of condition, decreased mobility, decreased ROM, hypomobility, increased muscle spasms, impaired flexibility, impaired tone, improper body mechanics, postural dysfunction, and pain.    ACTIVITY LIMITATIONS: working, quality of life   PARTICIPATION LIMITATIONS: occupation   PERSONAL FACTORS: Time since onset of injury/illness/exacerbation and 3+ comorbidities: Hypertension; Hypercholesterolemia; Renal cyst; Fluttering heart; Left arm numbness; Back pain; Health care maintenance; Cervical disc herniation; Loss of weight; Stress; CKD (chronic kidney disease), stage III (HCC); Tachycardia; GERD (gastroesophageal reflux disease); Headache; Hemorrhoid; Leg skin lesion, left; Tick bite of right hip; Neck pain; and Ear pain on their problem list. He  has a past medical history of History of Hypercholesterolemia, and Hypertension. He  has a past surgical history that includes Knee surgery (1982); Inner ear surgery; Tonsillectomy, C4-7 ACDF with Dr. Rise Mu 01/09/2015, surgery on the right ear 11/24/2022 for recurrent dermoid cyst abutting dehiscent sigmoid, meatoplasty performed and area around the sigmoid plate drilled down to exteriorize the cholesteatoma - matrix left over the sigmoid sinus due to risk of hemorrhage with removal are also affecting patient's functional outcome.    REHAB POTENTIAL: Good   CLINICAL DECISION MAKING: Evolving/moderate complexity   EVALUATION COMPLEXITY: Moderate     GOALS: Goals reviewed with patient? No  SHORT TERM GOALS: Target date: 01/21/2023   Patient will be independent with initial home exercise program for self-management of symptoms. Baseline: Initial HEP provided at IE  (01/07/23); Goal status: In-progress     LONG TERM GOALS: Target date: 04/01/2023   Patient will be independent with a long-term home exercise program for self-management of symptoms.  Baseline: Initial HEP provided at IE (01/07/23); Goal status: In-progress   2.  Patient will demonstrate improved FOTO to equal or greater than 75 by visit #10 to demonstrate improvement in overall condition and self-reported functional ability.  Baseline: 70 (01/07/23); Goal status: In-progress   3.  Patient will demonstrate ability to complete end range right cervical spine rotation plus extension without increased symptoms to improve his ability to complete work activities.  Baseline: most strongly reproduces symptoms (01/07/23); Goal status: In-progress   4.  Patient will demonstrate cervical spine AROM rotation equal or greater than 60 degrees bilaterally to improve ability to check blind spot when driving.  Baseline: R/L 40/50 (01/07/23); Goal status: In-progress   5.  Patient will complete community, work and/or recreational activities with 75% less limitation due to current condition.  Baseline: difficulty with work and quality of life (01/07/23); Goal status: In-progress       PLAN:   PT FREQUENCY: 1-2x/week   PT DURATION: 12 weeks   PLANNED INTERVENTIONS: Therapeutic exercises, Therapeutic activity, Neuromuscular re-education, Patient/Family education, Self Care, Joint mobilization, Dry Needling, Electrical stimulation, Spinal mobilization, Cryotherapy, Moist heat, Manual therapy, and Re-evaluation   PLAN FOR NEXT SESSION: Progressive neck/postural/functional strengthening, motor control, ROM, and stretching exercises as tolerated. Manual therapy and dry needling to decrease muscle tension and joint mobility. Education.    Cira Rue, PT, DPT 02/03/2023, 2:01 PM  [facility] [address] [place], [place] [address] P: [phone] I F:  [phone]

## 2023-02-05 ENCOUNTER — Ambulatory Visit: Payer: 59 | Admitting: Physical Therapy

## 2023-02-05 ENCOUNTER — Encounter: Payer: Self-pay | Admitting: Physical Therapy

## 2023-02-05 DIAGNOSIS — M542 Cervicalgia: Secondary | ICD-10-CM | POA: Diagnosis not present

## 2023-02-05 DIAGNOSIS — M792 Neuralgia and neuritis, unspecified: Secondary | ICD-10-CM

## 2023-02-05 NOTE — Therapy (Signed)
OUTPATIENT PHYSICAL THERAPY  TREATMENT NOTE   Patient Name: Rodney Benjamin MRN: [id number] DOB:1961/05/12, 62 y.o., male Today's Date: 02/05/2023  END OF SESSION:  PT End of Session - 02/05/23 1306     Visit Number 8    Number of Visits 13    Date for PT Re-Evaluation 04/01/23    Authorization Type [employer] reporting period from 01/07/2023    Authorization Time Period VL: 60 per cal yr    Authorization - Visit Number 8    Authorization - Number of Visits 60    Progress Note Due on Visit 10    PT Start Time 1303    PT Stop Time 1345    PT Time Calculation (min) 42 min    Activity Tolerance Patient tolerated treatment well    Behavior During Therapy WFL for tasks assessed/performed                Past Medical History:  Diagnosis Date   History of chicken pox    Hypercholesterolemia    Hypertension    Past Surgical History:  Procedure Laterality Date   INNER EAR SURGERY     cholesteatoma removed x 2   KNEE SURGERY  1982   right   TONSILLECTOMY     Patient Active Problem List   Diagnosis Date Noted   Facial pain 01/10/2023   Ear pain 11/24/2022   Neck pain 08/22/2022   Tick bite of right hip 06/26/2022   Colon cancer screening 06/22/2021   Leg skin lesion, left 12/25/2019   Hemorrhoid 12/09/2018   Headache 12/15/2017   GERD (gastroesophageal reflux disease) 04/25/2017   Tachycardia 04/23/2017   CKD (chronic kidney disease), stage III (HCC) 10/12/2016   Stress 01/26/2016   Loss of weight 01/31/2015   Cervical disc herniation 12/25/2014   Back pain 10/24/2014   Health care maintenance 10/24/2014   Fluttering heart 06/19/2014   Left arm numbness 06/19/2014   Renal cyst 06/12/2013   Hypercholesterolemia 06/06/2012   Hypertension 03/23/2012    PCP: Dale Kuttawa, MD   REFERRING PROVIDER: Loni Dolly, MP (neurosurgery)   REFERRING DIAG: cervical radiculopathy   THERAPY DIAG:  Cervicalgia  Neuralgia and neuritis    Rationale for  Evaluation and Treatment: Rehabilitation   ONSET DATE: Approx February 2024   PERTINENT HISTORY:  Patient is a 62 y.o. male who presents to outpatient physical therapy with a referral for medical diagnosis cervical radiculopathy. This patient's chief complaints consist of right sided neck pain with radiation to right upper trap and radiation of paresthesia into the top of the right side of the head, leading to the following functional deficits: difficulty working (doesn't feel as sharp), decreased quality of life. Patient has Hypertension; Hypercholesterolemia; Renal cyst; Fluttering heart; Left arm numbness; Back pain; Health care maintenance; Cervical disc herniation; Loss of weight; Stress; CKD (chronic kidney disease), stage III (HCC); Tachycardia; GERD (gastroesophageal reflux disease); Headache; Hemorrhoid; Leg skin lesion, left; Tick bite of right hip; Neck pain; and Ear pain on their problem list. He  has a past medical history of History of Hypercholesterolemia, and Hypertension. He  has a past surgical history that includes Knee surgery (1982); Inner ear surgery; Tonsillectomy, C4-7 ACDF with Dr. Rise Mu 01/09/2015, surgery on the right ear 11/24/2022 for recurrent dermoid cyst abutting dehiscent sigmoid, meatoplasty performed and area around the sigmoid plate drilled down to exteriorize the cholesteatoma - matrix left over the sigmoid sinus due to risk of hemorrhage with removal. Patient denies hx of cancer,  stroke, seizures, lung problems, heart problems, diabetes, unexplained weight loss, unexplained changes in bowel or bladder problems, unexplained stumbling or dropping things, and osteoporosis.   SUBJECTIVE:                                                                                                                                                                                                          SUBJECTIVE STATEMENT: Patient states he feels physical therapy has helped some with the  pain running up to his head, but it has not resolved. He states he thinks the exercises for thoracic mobility has helped the most. He states the symptoms at the right side of his neck is flared up today and he is unsure why. He felt fine after last PT session. He states his left hand may be a little better, with not as much tingling this morning there. He is keeping his fingers crossed that that is sort of resolving itself. He did his cervical retraction exercises 20 about 3-4x a day. He did not notice a difference in his pain or ROM when he did those. The first part of his week he has had a pretty good week overall. It is just this morning that is different.    PAIN:  NPRS: 3-4/10 radiating up from the right ear.   PRECAUTIONS: keep the packing in his ear.      PATIENT GOALS: "get rid of the pain"   NEXT MD VISIT: no appointment scheduled currently, waiting to see the results of neck MRI on Friday 02/06/2023.    OBJECTIVE    TODAY'S TREATMENT:   Therapeutic exercise: to centralize symptoms and improve ROM, strength, muscular endurance, and activity tolerance required for successful completion of functional activities.  - Upper body ergometer level 7 to encourage joint nutrition, warm tissue, induce analgesic effect of aerobic exercise, improve muscular strength and endurance,  and prepare for remainder of session. 5 minutes.  (Manual therapy - see below) - seated/standing checks for ROM and pain between bouts of manual therapy (no increased tingling or symptoms in L hand, no effect on right sided head pain or facial symptoms).  - education on trigeminal and facial nerves and relationship to patient's symptoms and cholesteatoma .  Manual therapy: to reduce pain and tissue tension, improve range of motion, neuromodulation, in order to promote improved ability to complete functional activities. SUPINE - MDT cervical retraction with clinician OP, 2x6 - MDT cervical retraction and extension with  distraction, 2x6 - MDT cervical retraction and extension with distraction and rotation 2x6 - STM to  bilateral temporalis and masseter muscles (strongly reproduces facial discomfort, educated patient in self STM to those regions).   Pt required multimodal cuing for proper technique and to facilitate improved neuromuscular control, strength, range of motion, and functional ability resulting in improved performance and form.  PATIENT EDUCATION:  Education details: Exercise purpose/form. Self management techniques. Education on HEP including handout.  Person educated: Patient Education method: Explanation, Demonstration, Tactile cues, Verbal cues, and Handouts Education comprehension: verbalized understanding, returned demonstration, tactile cues required, and needs further education   HOME EXERCISE PROGRAM: Access Code: [email or web address] URL: [email or web address] Date: 01/26/2023 Prepared by: Norton Blizzard  Exercises - Seated Levator Scapulae Stretch  - 1-2 x daily - 3 sets - 30 seconds hold - Seated Upper Trapezius Stretch  - 1-2 x daily - 3 sets - 30 seconds hold - Shoulder External Rotation and Scapular Retraction with Resistance  - 1 x daily - 3 sets - 15 reps - Low Trap Setting at Wall  - 1 x daily - 3 sets - 10 reps - Quadruped Thoracic Rotation Full Range with Hand on Neck  - 3-5 x weekly - 2 sets - 10 reps - Quadruped Thoracic Rotation with Hand on Low Back  - 3-5 x weekly - 2 sets - 10 reps  Patient Education - Scientist, physiological - Ergonomics for Neck Discomfort - Introduction to Ergonomics  HOME EXERCISE PROGRAM [[id number]] View at [email or web address]" using code: [id number] Suboccipital and Economist  -  Repeat 10 Repetitions, Hold 5 Seconds, Perform 2 Times a Day  HOME EXERCISE PROGRAM [[id number]] View at "[email or web address]" using code: [id number] Prayer Stretch -  Repeat 20 Repetitions, Hold 5 Seconds, Complete 2 Sets, Perform 1 Times a Day   ASSESSMENT:    CLINICAL IMPRESSION: Patient arrives reporting elevated right sided head pain and facial symptoms today after several better days, and less left sided hand symptoms. Today's session focused on progressing MDT progressions for extension preference in unloaded position to help further improve symptoms after no change since last PT session. Patient had difficulty tolerating retraction to extension due to end range pain in base of left side of neck that was consistent each time but with mildly improving ROM into this direction with repetition. Jaw discomfort lead to discontinuation of further MDT procedures that required force through the jaw. He also felt no change in right sided symptoms with these interventions. Patient further differentiated between pain that feels like it is coming from his neck over the top of his head on the right and facial symptoms that he feels is a separate issue, likely related to his cholesteatoma and surgery for that. He had reproduction of those symptoms with STM to right masseter and temporalis with slight decrease in symptoms by end of session. Patient educated in how to perform self STM to these regions for symptom relief. Plan to continue with interventions to improve neck and upper thoracic ROM and soft tissue extensibility next session. Patient would benefit from continued management of limiting condition by skilled physical therapist to address remaining impairments and functional limitations to work towards stated goals and return to PLOF or maximal functional independence.   From initial PT evaluation 01/07/2023:  Patient is a 62 y.o. male referred to outpatient physical therapy with a medical diagnosis of cervical radiculopathy who presents with signs and symptoms consistent with chronic right sided neck pain with radiation into right side of head and into upper trap. Patient's pain appears to be referring from tight and tender portions of right  upper trap, cervical  paraspinals, and SCM. No radiation identified from suboccipital region at initial eval. No reproduction of distal symptoms with Spurling's part B test at initial eval.  Patient does have significant hypomobility in thoracic and cervical spine with increased forward head posture that may also be contributing to symptoms. Patient presents with significant pain, ROM, joint stiffness, muscle tension, posture, paresthesia, muscle performance (strength/power/endurance), and activity tolerance impairments that are limiting ability to complete work and daily activities without difficulty and negatively effects quality of life. Patient will benefit from skilled physical therapy intervention to address current body structure impairments and activity limitations to improve function and work towards goals set in current POC in order to return to prior level of function or maximal functional improvement.    OBJECTIVE IMPAIRMENTS: decreased activity tolerance, decreased knowledge of condition, decreased mobility, decreased ROM, hypomobility, increased muscle spasms, impaired flexibility, impaired tone, improper body mechanics, postural dysfunction, and pain.    ACTIVITY LIMITATIONS: working, quality of life   PARTICIPATION LIMITATIONS: occupation   PERSONAL FACTORS: Time since onset of injury/illness/exacerbation and 3+ comorbidities: Hypertension; Hypercholesterolemia; Renal cyst; Fluttering heart; Left arm numbness; Back pain; Health care maintenance; Cervical disc herniation; Loss of weight; Stress; CKD (chronic kidney disease), stage III (HCC); Tachycardia; GERD (gastroesophageal reflux disease); Headache; Hemorrhoid; Leg skin lesion, left; Tick bite of right hip; Neck pain; and Ear pain on their problem list. He  has a past medical history of History of Hypercholesterolemia, and Hypertension. He  has a past surgical history that includes Knee surgery (1982); Inner ear surgery; Tonsillectomy, C4-7 ACDF with Dr.  Rise Mu 01/09/2015, surgery on the right ear 11/24/2022 for recurrent dermoid cyst abutting dehiscent sigmoid, meatoplasty performed and area around the sigmoid plate drilled down to exteriorize the cholesteatoma - matrix left over the sigmoid sinus due to risk of hemorrhage with removal are also affecting patient's functional outcome.    REHAB POTENTIAL: Good   CLINICAL DECISION MAKING: Evolving/moderate complexity   EVALUATION COMPLEXITY: Moderate     GOALS: Goals reviewed with patient? No  SHORT TERM GOALS: Target date: 01/21/2023   Patient will be independent with initial home exercise program for self-management of symptoms. Baseline: Initial HEP provided at IE (01/07/23); Goal status: In-progress     LONG TERM GOALS: Target date: 04/01/2023   Patient will be independent with a long-term home exercise program for self-management of symptoms.  Baseline: Initial HEP provided at IE (01/07/23); Goal status: In-progress   2.  Patient will demonstrate improved FOTO to equal or greater than 75 by visit #10 to demonstrate improvement in overall condition and self-reported functional ability.  Baseline: 70 (01/07/23); Goal status: In-progress   3.  Patient will demonstrate ability to complete end range right cervical spine rotation plus extension without increased symptoms to improve his ability to complete work activities.  Baseline: most strongly reproduces symptoms (01/07/23); Goal status: In-progress   4.  Patient will demonstrate cervical spine AROM rotation equal or greater than 60 degrees bilaterally to improve ability to check blind spot when driving.  Baseline: R/L 40/50 (01/07/23); Goal status: In-progress   5.  Patient will complete community, work and/or recreational activities with 75% less limitation due to current condition.  Baseline: difficulty with work and quality of life (01/07/23); Goal status: In-progress       PLAN:   PT FREQUENCY: 1-2x/week   PT DURATION:  12 weeks   PLANNED INTERVENTIONS: Therapeutic exercises, Therapeutic activity, Neuromuscular re-education, Patient/Family education, Self Care, Joint mobilization, Dry Needling, Electrical  stimulation, Spinal mobilization, Cryotherapy, Moist heat, Manual therapy, and Re-evaluation   PLAN FOR NEXT SESSION: Progressive neck/postural/functional strengthening, motor control, ROM, and stretching exercises as tolerated. Manual therapy and dry needling to decrease muscle tension and joint mobility. Education.    Cira Rue, PT, DPT 02/05/2023, 2:15 PM  [facility] [address] [place], [place] [address] P: [phone] I F: [phone]

## 2023-02-06 ENCOUNTER — Ambulatory Visit
Admission: RE | Admit: 2023-02-06 | Discharge: 2023-02-06 | Disposition: A | Discharge status: Home / Self Care | Payer: 59 | Source: Ambulatory Visit | Attending: Internal Medicine | Admitting: Internal Medicine

## 2023-02-06 DIAGNOSIS — R200 Anesthesia of skin: Secondary | ICD-10-CM | POA: Insufficient documentation

## 2023-02-06 DIAGNOSIS — M542 Cervicalgia: Secondary | ICD-10-CM | POA: Insufficient documentation

## 2023-02-06 DIAGNOSIS — M5020 Other cervical disc displacement, unspecified cervical region: Secondary | ICD-10-CM | POA: Diagnosis not present

## 2023-02-09 ENCOUNTER — Ambulatory Visit: Payer: 59 | Admitting: Physical Therapy

## 2023-02-09 ENCOUNTER — Encounter: Payer: Self-pay | Admitting: Physical Therapy

## 2023-02-09 ENCOUNTER — Other Ambulatory Visit: Payer: Self-pay | Admitting: Internal Medicine

## 2023-02-09 DIAGNOSIS — M542 Cervicalgia: Secondary | ICD-10-CM

## 2023-02-09 DIAGNOSIS — M792 Neuralgia and neuritis, unspecified: Secondary | ICD-10-CM

## 2023-02-09 DIAGNOSIS — R519 Headache, unspecified: Secondary | ICD-10-CM

## 2023-02-09 NOTE — Progress Notes (Signed)
Order placed for [facility] referral.  

## 2023-02-09 NOTE — Therapy (Signed)
OUTPATIENT PHYSICAL THERAPY  TREATMENT NOTE   Patient Name: Rodney Benjamin MRN: [id number] DOB:[birth year], 62 y.o., male Today's Date: 02/09/2023  END OF SESSION:  PT End of Session - 02/09/23 1031     Visit Number 9    Number of Visits 13    Date for PT Re-Evaluation 04/01/23    Authorization Type [employer] reporting period from 01/07/2023    Authorization Time Period VL: 60 per cal yr    Authorization - Visit Number 9    Authorization - Number of Visits 60    Progress Note Due on Visit 10    PT Start Time 1031    PT Stop Time 1113    PT Time Calculation (min) 42 min    Activity Tolerance Patient tolerated treatment well    Behavior During Therapy WFL for tasks assessed/performed              Past Medical History:  Diagnosis Date   History of chicken pox    Hypercholesterolemia    Hypertension    Past Surgical History:  Procedure Laterality Date   INNER EAR SURGERY     cholesteatoma removed x 2   KNEE SURGERY  1982   right   TONSILLECTOMY     Patient Active Problem List   Diagnosis Date Noted   Facial pain 01/10/2023   Ear pain 11/24/2022   Neck pain 08/22/2022   Tick bite of right hip 06/26/2022   Colon cancer screening 06/22/2021   Leg skin lesion, left 12/25/2019   Hemorrhoid 12/09/2018   Headache 12/15/2017   GERD (gastroesophageal reflux disease) 04/25/2017   Tachycardia 04/23/2017   CKD (chronic kidney disease), stage III (HCC) 10/12/2016   Stress 01/26/2016   Loss of weight 01/31/2015   Cervical disc herniation 12/25/2014   Back pain 10/24/2014   Health care maintenance 10/24/2014   Fluttering heart 06/19/2014   Left arm numbness 06/19/2014   Renal cyst 06/12/2013   Hypercholesterolemia 06/06/2012   Hypertension 03/23/2012    PCP: Dale Holcomb, MD   REFERRING PROVIDER: Loni Dolly, MP (neurosurgery)   REFERRING DIAG: cervical radiculopathy   THERAPY DIAG:  Cervicalgia  Neuralgia and neuritis    Rationale for  Evaluation and Treatment: Rehabilitation   ONSET DATE: Approx February 2024   PERTINENT HISTORY:  Patient is a 62 y.o. male who presents to outpatient physical therapy with a referral for medical diagnosis cervical radiculopathy. This patient's chief complaints consist of right sided neck pain with radiation to right upper trap and radiation of paresthesia into the top of the right side of the head, leading to the following functional deficits: difficulty working (doesn't feel as sharp), decreased quality of life. Patient has Hypertension; Hypercholesterolemia; Renal cyst; Fluttering heart; Left arm numbness; Back pain; Health care maintenance; Cervical disc herniation; Loss of weight; Stress; CKD (chronic kidney disease), stage III (HCC); Tachycardia; GERD (gastroesophageal reflux disease); Headache; Hemorrhoid; Leg skin lesion, left; Tick bite of right hip; Neck pain; and Ear pain on their problem list. He  has a past medical history of History of Hypercholesterolemia, and Hypertension. He  has a past surgical history that includes Knee surgery (1982); Inner ear surgery; Tonsillectomy, C4-7 ACDF with Dr. Rise Mu 01/09/2015, surgery on the right ear 11/24/2022 for recurrent dermoid cyst abutting dehiscent sigmoid, meatoplasty performed and area around the sigmoid plate drilled down to exteriorize the cholesteatoma - matrix left over the sigmoid sinus due to risk of hemorrhage with removal. Patient denies hx of cancer, stroke, seizures,  lung problems, heart problems, diabetes, unexplained weight loss, unexplained changes in bowel or bladder problems, unexplained stumbling or dropping things, and osteoporosis.   SUBJECTIVE:                                                                                                                                                                                                          SUBJECTIVE STATEMENT: Patient states his neck is feeling better today but yesterday 4/10  radiating from the right side of the neck up the back of his head. He states he had more pain this weekend and was running a pole saw over his head a lot. He states he did the stretches from PT and they were helpful. He states his face symptoms were uncomfortable yesterday, today not as bad. He cannot tell that what he did at last PT session was helpful, but he was also not sore from it. He states his left UE has become more intermittent. He states he can sit at his desk for 3-4 hours and not have any left hand symptoms, then get up and it will tingle for 20 minutes. He states he got the results back from his neck MRI already and his PCP referred him to his previous surgeon Dr. Rise Mu who has requested a CT scan before seeing the patient if needed.    PAIN:  NPRS: 2-3/10 radiating up behind head and from the right ear.   PRECAUTIONS: keep the packing in his ear.      PATIENT GOALS: "get rid of the pain"   NEXT MD VISIT: no appointment scheduled currently, waiting to see the results of neck MRI on Friday 02/06/2023.    OBJECTIVE  IMAGING FINDINGS Cervical spine MRI report from 02/06/2023:  CLINICAL DATA:  Initial evaluation for chronic neck pain.   EXAM: MRI CERVICAL SPINE WITHOUT CONTRAST   TECHNIQUE: Multiplanar, multisequence MR imaging of the cervical spine was performed. No intravenous contrast was administered.   COMPARISON:  Prior radiograph from 10/22/2022.   FINDINGS: Alignment: Straightening of the normal cervical lordosis. Trace retrolisthesis of C3 on C4.   Vertebrae: Prior ACDF at C4-C7 with solid arthrodesis. Mild chronic height loss noted at the superior endplate of T1. Vertebral body height otherwise maintained. Bone marrow signal intensity within normal limits. No worrisome osseous lesions. No abnormal marrow edema.   Cord: Normal signal and morphology.   Posterior Fossa, vertebral arteries, paraspinal tissues: Unremarkable.   Disc levels:   C2-C3: Mild disc  bulge with uncovertebral spurring. Mild left-sided facet spurring. No spinal stenosis. Foramina remain  patent.   C3-C4: Right paracentral disc protrusion indents the right ventral thecal sac, contacting and flattening the right hemicord (series 8, image 10). No cord signal changes. Mild to moderate spinal stenosis. Superimposed uncovertebral spurring with resultant moderate left C4 foraminal narrowing. Right neural foramen remains patent.   C4-C5:  Prior fusion.  No residual canal or foraminal stenosis.   C5-C6: Prior fusion. No residual spinal stenosis. Left-sided uncovertebral and facet hypertrophy with mild left C6 foraminal narrowing. Right neural foramen remains patent.   C6-C7: Prior fusion. Right paracentral endplate osseous spurring indents the right ventral thecal sac (series 8, image 26). No significant spinal stenosis. Foramina remain patent.   C7-T1: Normal interspace. Moderate right with mild left facet hypertrophy. No spinal stenosis. Mild right C8 foraminal narrowing. Left neural foramina remains patent.   IMPRESSION: 1. Prior ACDF at C4-C7 without residual spinal stenosis. Mild left C6 foraminal narrowing related to residual uncovertebral and facet hypertrophy. 2. 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 3. Right-sided facet hypertrophy at C7-T1 with resultant mild right C8 foraminal stenosis. He     Electronically Signed   By: Rise Mu M.D.   On: 02/08/2023 18:33  TODAY'S TREATMENT:   Therapeutic exercise: to centralize symptoms and improve ROM, strength, muscular endurance, and activity tolerance required for successful completion of functional activities.  - Upper body ergometer level 7 to encourage joint nutrition, warm tissue, induce analgesic effect of aerobic exercise, improve muscular strength and endurance,  and prepare  for remainder of session. 8 minutes. Patient educated on MRI results during.  (Manual therapy - see below) - seated attempt at right sided LS stretch (unable to perform without feeling it more as left sided stiffness and reproducing left hand symptoms).   Manual therapy: to reduce pain and tissue tension, improve range of motion, neuromodulation, in order to promote improved ability to complete functional activities. PRONE - CPA with KE wedge at mid to upper thoracic spine, grade III-IV - CPA at upper cervical spine, grade II-IV - STM to bilateral upper traps SUPINE - distraction with pillow case at back of neck, 3x30 seconds sustained hold.  - STM to bilateral upper traps - STM with sustained pressure at right upper trap, levator scap, and paraspinal muscles that reproduce concordant pain up to top of patients head. Palpable decrease in tension by end of manual therapy.   Pt required multimodal cuing for proper technique and to facilitate improved neuromuscular control, strength, range of motion, and functional ability resulting in improved performance and form.  PATIENT EDUCATION:  Education details: Exercise purpose/form. Self management techniques. Education on HEP including handout.  Person educated: Patient Education method: Explanation, Demonstration, Tactile cues, Verbal cues, and Handouts Education comprehension: verbalized understanding, returned demonstration, tactile cues required, and needs further education   HOME EXERCISE PROGRAM: Access Code: [email or web address] URL: [email or web address] Date: 01/26/2023 Prepared by: Norton Blizzard  Exercises - Seated Levator Scapulae Stretch  - 1-2 x daily - 3 sets - 30 seconds hold - Seated Upper Trapezius Stretch  - 1-2 x daily - 3 sets - 30 seconds hold - Shoulder External Rotation and Scapular Retraction with Resistance  - 1 x daily - 3 sets - 15 reps - Low Trap Setting at Wall  - 1 x daily - 3 sets - 10 reps - Quadruped  Thoracic Rotation Full Range with Hand on Neck  - 3-5 x weekly - 2 sets -  10 reps - Architect with Hand on Low Back  - 3-5 x weekly - 2 sets - 10 reps  Patient Education - Scientist, physiological - Ergonomics for Neck Discomfort - Introduction to Ergonomics  HOME EXERCISE PROGRAM [[id number]] View at [email or web address]" using code: [id number] Suboccipital and Economist  -  Repeat 10 Repetitions, Hold 5 Seconds, Perform 2 Times a Day  HOME EXERCISE PROGRAM [[id number]] View at "[email or web address]" using code: [id number] Prayer Stretch -  Repeat 20 Repetitions, Hold 5 Seconds, Complete 2 Sets, Perform 1 Times a Day   ASSESSMENT:   CLINICAL IMPRESSION: Patient arrives continued pain from the right neck up to his head and intermittent left hand paresthesia. Today's session focused on manual interventions for improved joint mobility and soft tissue extensibility. Pateint again with concordant pain to the top of the right head produced with palpation to the right UT/LS and paraspinals. Plan to focus more on this area next session with possible dry needling.Patient would benefit from continued management of limiting condition by skilled physical therapist to address remaining impairments and functional limitations to work towards stated goals and return to PLOF or maximal functional independence.   From initial PT evaluation 01/07/2023:  Patient is a 62 y.o. male referred to outpatient physical therapy with a medical diagnosis of cervical radiculopathy who presents with signs and symptoms consistent with chronic right sided neck pain with radiation into right side of head and into upper trap. Patient's pain appears to be referring from tight and tender portions of right upper trap, cervical paraspinals, and SCM. No radiation identified from suboccipital region at initial eval. No reproduction of distal symptoms with Spurling's part B test at initial eval.  Patient does have significant  hypomobility in thoracic and cervical spine with increased forward head posture that may also be contributing to symptoms. Patient presents with significant pain, ROM, joint stiffness, muscle tension, posture, paresthesia, muscle performance (strength/power/endurance), and activity tolerance impairments that are limiting ability to complete work and daily activities without difficulty and negatively effects quality of life. Patient will benefit from skilled physical therapy intervention to address current body structure impairments and activity limitations to improve function and work towards goals set in current POC in order to return to prior level of function or maximal functional improvement.    OBJECTIVE IMPAIRMENTS: decreased activity tolerance, decreased knowledge of condition, decreased mobility, decreased ROM, hypomobility, increased muscle spasms, impaired flexibility, impaired tone, improper body mechanics, postural dysfunction, and pain.    ACTIVITY LIMITATIONS: working, quality of life   PARTICIPATION LIMITATIONS: occupation   PERSONAL FACTORS: Time since onset of injury/illness/exacerbation and 3+ comorbidities: Hypertension; Hypercholesterolemia; Renal cyst; Fluttering heart; Left arm numbness; Back pain; Health care maintenance; Cervical disc herniation; Loss of weight; Stress; CKD (chronic kidney disease), stage III (HCC); Tachycardia; GERD (gastroesophageal reflux disease); Headache; Hemorrhoid; Leg skin lesion, left; Tick bite of right hip; Neck pain; and Ear pain on their problem list. He  has a past medical history of History of Hypercholesterolemia, and Hypertension. He  has a past surgical history that includes Knee surgery (1982); Inner ear surgery; Tonsillectomy, C4-7 ACDF with Dr. Rise Mu 01/09/2015, surgery on the right ear 11/24/2022 for recurrent dermoid cyst abutting dehiscent sigmoid, meatoplasty performed and area around the sigmoid plate drilled down to exteriorize the  cholesteatoma - matrix left over the sigmoid sinus due to risk of hemorrhage with removal are also affecting patient's functional outcome.    REHAB POTENTIAL: Good   CLINICAL DECISION MAKING: Evolving/moderate complexity  EVALUATION COMPLEXITY: Moderate     GOALS: Goals reviewed with patient? No  SHORT TERM GOALS: Target date: 01/21/2023   Patient will be independent with initial home exercise program for self-management of symptoms. Baseline: Initial HEP provided at IE (01/07/23); Goal status: In-progress     LONG TERM GOALS: Target date: 04/01/2023   Patient will be independent with a long-term home exercise program for self-management of symptoms.  Baseline: Initial HEP provided at IE (01/07/23); Goal status: In-progress   2.  Patient will demonstrate improved FOTO to equal or greater than 75 by visit #10 to demonstrate improvement in overall condition and self-reported functional ability.  Baseline: 70 (01/07/23); Goal status: In-progress   3.  Patient will demonstrate ability to complete end range right cervical spine rotation plus extension without increased symptoms to improve his ability to complete work activities.  Baseline: most strongly reproduces symptoms (01/07/23); Goal status: In-progress   4.  Patient will demonstrate cervical spine AROM rotation equal or greater than 60 degrees bilaterally to improve ability to check blind spot when driving.  Baseline: R/L 40/50 (01/07/23); Goal status: In-progress   5.  Patient will complete community, work and/or recreational activities with 75% less limitation due to current condition.  Baseline: difficulty with work and quality of life (01/07/23); Goal status: In-progress       PLAN:   PT FREQUENCY: 1-2x/week   PT DURATION: 12 weeks   PLANNED INTERVENTIONS: Therapeutic exercises, Therapeutic activity, Neuromuscular re-education, Patient/Family education, Self Care, Joint mobilization, Dry Needling, Electrical  stimulation, Spinal mobilization, Cryotherapy, Moist heat, Manual therapy, and Re-evaluation   PLAN FOR NEXT SESSION: Progressive neck/postural/functional strengthening, motor control, ROM, and stretching exercises as tolerated. Manual therapy and dry needling to decrease muscle tension and joint mobility. Education.    Cira Rue, PT, DPT 02/09/2023, 12:09 PM  [facility] [address] [place], [place] [address] P: [phone] I F: [phone]

## 2023-02-10 ENCOUNTER — Other Ambulatory Visit: Payer: Self-pay | Admitting: Nurse Practitioner

## 2023-02-10 DIAGNOSIS — Z981 Arthrodesis status: Secondary | ICD-10-CM

## 2023-02-12 ENCOUNTER — Ambulatory Visit: Payer: 59 | Admitting: Physical Therapy

## 2023-02-12 ENCOUNTER — Encounter: Payer: Self-pay | Admitting: Physical Therapy

## 2023-02-12 DIAGNOSIS — M542 Cervicalgia: Secondary | ICD-10-CM

## 2023-02-12 DIAGNOSIS — M792 Neuralgia and neuritis, unspecified: Secondary | ICD-10-CM

## 2023-02-12 NOTE — Therapy (Addendum)
OUTPATIENT PHYSICAL THERAPY  TREATMENT NOTE / PROGRESS NOTE  Dates of reporting from 01/07/2023 - 02/12/2023   Patient Name: Rodney Benjamin MRN: [id number] DOB:1960/07/09, 62 y.o., male Today's Date: 02/12/2023  END OF SESSION:  PT End of Session - 02/12/23 1138     Visit Number 10    Number of Visits 13    Date for PT Re-Evaluation 04/01/23    Authorization Type [employer] reporting period from 01/07/2023    Authorization Time Period VL: 60 per cal yr    Authorization - Visit Number 10    Authorization - Number of Visits 60    Progress Note Due on Visit 10    PT Start Time 1119    PT Stop Time 1200    PT Time Calculation (min) 41 min    Activity Tolerance Patient tolerated treatment well    Behavior During Therapy WFL for tasks assessed/performed               Past Medical History:  Diagnosis Date   History of chicken pox    Hypercholesterolemia    Hypertension    Past Surgical History:  Procedure Laterality Date   INNER EAR SURGERY     cholesteatoma removed x 2   KNEE SURGERY  1982   right   TONSILLECTOMY     Patient Active Problem List   Diagnosis Date Noted   Facial pain 01/10/2023   Ear pain 11/24/2022   Neck pain 08/22/2022   Tick bite of right hip 06/26/2022   Colon cancer screening 06/22/2021   Leg skin lesion, left 12/25/2019   Hemorrhoid 12/09/2018   Headache 12/15/2017   GERD (gastroesophageal reflux disease) 04/25/2017   Tachycardia 04/23/2017   CKD (chronic kidney disease), stage III (HCC) 10/12/2016   Stress 01/26/2016   Loss of weight 01/31/2015   Cervical disc herniation 12/25/2014   Back pain 10/24/2014   Health care maintenance 10/24/2014   Fluttering heart 06/19/2014   Left arm numbness 06/19/2014   Renal cyst 06/12/2013   Hypercholesterolemia 06/06/2012   Hypertension 03/23/2012    PCP: Dale Gr[place], MD   REFERRING PROVIDER: Loni Dolly, MP (neurosurgery)   REFERRING DIAG: cervical radiculopathy   THERAPY DIAG:   Cervicalgia  Neuralgia and neuritis    Rationale for Evaluation and Treatment: Rehabilitation   ONSET DATE: Approx February 2024   PERTINENT HISTORY:  Patient is a 62 y.o. male who presents to outpatient physical therapy with a referral for medical diagnosis cervical radiculopathy. This patient's chief complaints consist of right sided neck pain with radiation to right upper trap and radiation of paresthesia into the top of the right side of the head, leading to the following functional deficits: difficulty working (doesn't feel as sharp), decreased quality of life. Patient has Hypertension; Hypercholesterolemia; Renal cyst; Fluttering heart; Left arm numbness; Back pain; Health care maintenance; Cervical disc herniation; Loss of weight; Stress; CKD (chronic kidney disease), stage III (HCC); Tachycardia; GERD (gastroesophageal reflux disease); Headache; Hemorrhoid; Leg skin lesion, left; Tick bite of right hip; Neck pain; and Ear pain on their problem list. He  has a past medical history of History of Hypercholesterolemia, and Hypertension. He  has a past surgical history that includes Knee surgery (1982); Inner ear surgery; Tonsillectomy, C4-7 ACDF with Dr. Rise Mu 01/09/2015, surgery on the right ear 11/24/2022 for recurrent dermoid cyst abutting dehiscent sigmoid, meatoplasty performed and area around the sigmoid plate drilled down to exteriorize the cholesteatoma - matrix left over the sigmoid sinus due to  risk of hemorrhage with removal. Patient denies hx of cancer, stroke, seizures, lung problems, heart problems, diabetes, unexplained weight loss, unexplained changes in bowel or bladder problems, unexplained stumbling or dropping things, and osteoporosis.   SUBJECTIVE:                                                                                                                                                                                                          SUBJECTIVE STATEMENT: Patient  states his symptoms are about the same and he has his CT scheduled tomorrow. L UE still tingles when he first gets up after sitting a while. It does not bother him when he just sitting at his desk or when he is up moving around. He states the mobility HEP for upper thoracic spine helped calm his symptoms that were lit up last night.    PAIN:  NPRS: 3-4/10 radiating up behind head and from the right ear.   PRECAUTIONS: keep the packing in his ear.      PATIENT GOALS: "get rid of the pain"   NEXT MD VISIT: no appointment scheduled currently, waiting to complete CT scan scheduled for 02/13/23   OBJECTIVE  SELF-REPORTED FUNCTION FOTO score: 72/100 (neck questionnaire)  SPINE MOTION   CERVICAL SPINE AROM *Indicates pain Flexion: 40 most motion from upper cervical spine Extension: 40 mild catch at left base of neck, most of motion from upper cervical spine Side Flexion:       R 45 increased tightness at contralateral UT       L 32 increased tightness at ipsilateral neck Rotation:  R 65 mild increased tightness up neck to behind right ear L 61 increased tightness at left base of neck Protraction: WFL Retraction: 1.25  inches, feels like a catch at left base of neck R Rotation/extension: reproduces symptoms    TODAY'S TREATMENT:   Therapeutic exercise: to centralize symptoms and improve ROM, strength, muscular endurance, and activity tolerance required for successful completion of functional activities.  - Upper body ergometer level 7 to encourage joint nutrition, warm tissue, induce analgesic effect of aerobic exercise, improve muscular strength and endurance,  and prepare for remainder of session. 8 minutes. Patient educated on MRI results during.  - measurements to assess progress (see above) (Manual therapy - see below)  Manual therapy: to reduce pain and tissue tension, improve range of motion, neuromodulation, in order to promote improved ability to complete functional  activities. HOOKLYING - STM to right upper trap, levator scap, cervical paraspinals, and scalene muscles  Modality: Dry needling performed to  right upper trap to decrease pain and spasms along patient's right neck and head region with patient in hooklying utilizing 1 dry needle(s) .30mm x 30mm with 4 sticks at right upper trap . Patient educated about the risks and benefits from therapy and verbally consents to treatment. Many twitch responses noted at last stick location.  Dry needling performed by Luretha Murphy. Ilsa Iha PT, DPT who is certified in this technique.   Pt required multimodal cuing for proper technique and to facilitate improved neuromuscular control, strength, range of motion, and functional ability resulting in improved performance and form.  PATIENT EDUCATION:  Education details: Exercise purpose/form. Self management techniques. Education on HEP including handout.  Person educated: Patient Education method: Explanation, Demonstration, Tactile cues, Verbal cues, and Handouts Education comprehension: verbalized understanding, returned demonstration, tactile cues required, and needs further education   HOME EXERCISE PROGRAM: Access Code: [email or web address] URL: [email or web address] Date: 01/26/2023 Prepared by: Norton Blizzard  Exercises - Seated Levator Scapulae Stretch  - 1-2 x daily - 3 sets - 30 seconds hold - Seated Upper Trapezius Stretch  - 1-2 x daily - 3 sets - 30 seconds hold - Shoulder External Rotation and Scapular Retraction with Resistance  - 1 x daily - 3 sets - 15 reps - Low Trap Setting at Wall  - 1 x daily - 3 sets - 10 reps - Quadruped Thoracic Rotation Full Range with Hand on Neck  - 3-5 x weekly - 2 sets - 10 reps - Quadruped Thoracic Rotation with Hand on Low Back  - 3-5 x weekly - 2 sets - 10 reps  Patient Education - Scientist, physiological - Ergonomics for Neck Discomfort - Introduction to Ergonomics  HOME EXERCISE PROGRAM [[id number]] View at  "[email or web address]" using code: [id number] Suboccipital and Economist  -  Repeat 10 Repetitions, Hold 5 Seconds, Perform 2 Times a Day  HOME EXERCISE PROGRAM [[id number]] View at "[email or web address]" using code: [id number] Prayer Stretch -  Repeat 20 Repetitions, Hold 5 Seconds, Complete 2 Sets, Perform 1 Times a Day   ASSESSMENT:   CLINICAL IMPRESSION: Patient has attended 10 physical therapy sessions since starting current episode of care on 01/07/2023. He has made some progress towards some goals, and reports stretches for upper thoracic spine have been helpful, but he continues to have similar concordant pain up the right side of the neck to the top of his head. Initial approach to treatment included focus on soft tissue at right upper trap and neck region, since pressure there reproduced patient's symptoms. However, after minimal carry over or lasting relief, intervention focus shifted to joint stiffness and exercises to try to address possible underlying triggers for muscle tension. With only mild improvement and continued reproduction of pain with pressure to right UT, plan to focus more vigorously on soft tissue techniques including dry needling over the next few visits to see if more sustained and aggressive intervention improves symptoms while patient awaits advice from his spinal surgeon. Plan to discharge by visit 15 if no significant improvement by then. Patient would benefit from continued management of limiting condition by skilled physical therapist to address remaining impairments and functional limitations to work towards stated goals and return to PLOF or maximal functional independence.    From initial PT evaluation 01/07/2023:  Patient is a 62 y.o. male referred to outpatient physical therapy with a medical diagnosis of cervical radiculopathy who presents with signs and symptoms consistent with chronic right sided neck pain with radiation into right side of head and into  upper trap. Patient's pain appears to be referring from tight and tender portions of right upper trap, cervical paraspinals, and SCM. No radiation identified from suboccipital region at initial eval. No reproduction of distal symptoms with Spurling's part B test at initial eval.  Patient does have significant hypomobility in thoracic and cervical spine with increased forward head posture that may also be contributing to symptoms. Patient presents with significant pain, ROM, joint stiffness, muscle tension, posture, paresthesia, muscle performance (strength/power/endurance), and activity tolerance impairments that are limiting ability to complete work and daily activities without difficulty and negatively effects quality of life. Patient will benefit from skilled physical therapy intervention to address current body structure impairments and activity limitations to improve function and work towards goals set in current POC in order to return to prior level of function or maximal functional improvement.    OBJECTIVE IMPAIRMENTS: decreased activity tolerance, decreased knowledge of condition, decreased mobility, decreased ROM, hypomobility, increased muscle spasms, impaired flexibility, impaired tone, improper body mechanics, postural dysfunction, and pain.    ACTIVITY LIMITATIONS: working, quality of life   PARTICIPATION LIMITATIONS: occupation   PERSONAL FACTORS: Time since onset of injury/illness/exacerbation and 3+ comorbidities: Hypertension; Hypercholesterolemia; Renal cyst; Fluttering heart; Left arm numbness; Back pain; Health care maintenance; Cervical disc herniation; Loss of weight; Stress; CKD (chronic kidney disease), stage III (HCC); Tachycardia; GERD (gastroesophageal reflux disease); Headache; Hemorrhoid; Leg skin lesion, left; Tick bite of right hip; Neck pain; and Ear pain on their problem list. He  has a past medical history of History of Hypercholesterolemia, and Hypertension. He  has a past  surgical history that includes Knee surgery (1982); Inner ear surgery; Tonsillectomy, C4-7 ACDF with Dr. Rise Mu 01/09/2015, surgery on the right ear 11/24/2022 for recurrent dermoid cyst abutting dehiscent sigmoid, meatoplasty performed and area around the sigmoid plate drilled down to exteriorize the cholesteatoma - matrix left over the sigmoid sinus due to risk of hemorrhage with removal are also affecting patient's functional outcome.    REHAB POTENTIAL: Good   CLINICAL DECISION MAKING: Evolving/moderate complexity   EVALUATION COMPLEXITY: Moderate     GOALS: Goals reviewed with patient? No  SHORT TERM GOALS: Target date: 01/21/2023   Patient will be independent with initial home exercise program for self-management of symptoms. Baseline: Initial HEP provided at IE (01/07/23); Goal status: MET     LONG TERM GOALS: Target date: 04/01/2023   Patient will be independent with a long-term home exercise program for self-management of symptoms.  Baseline: Initial HEP provided at IE (01/07/23); participating 2-3x a day (02/12/2023);  Goal status: In-progress   2.  Patient will demonstrate improved FOTO to equal or greater than 75 by visit #10 to demonstrate improvement in overall condition and self-reported functional ability.  Baseline: 70 (01/07/23); 72 at visit #10 (02/12/2023);  Goal status: In-progress   3.  Patient will demonstrate ability to complete end range right cervical spine rotation plus extension without increased symptoms to improve his ability to complete work activities.  Baseline: most strongly reproduces symptoms (01/07/23); still reproduces symptoms but not all the way to the top of his head (02/12/2023);  Goal status: In-progress   4.  Patient will demonstrate cervical spine AROM rotation equal or greater than 60 degrees bilaterally to improve ability to check blind spot when driving.  Baseline: R/L 40/50 (01/07/23); R/L 65/61 (02/12/2023);  Goal status: MET   5.   Patient will complete community, work and/or recreational activities with 75% less limitation due to current condition.  Baseline: difficulty with  work and quality of life (01/07/23); estimates 45-50% (02/12/2023);  Goal status: In-progress       PLAN:   PT FREQUENCY: 1-2x/week   PT DURATION: 12 weeks   PLANNED INTERVENTIONS: Therapeutic exercises, Therapeutic activity, Neuromuscular re-education, Patient/Family education, Self Care, Joint mobilization, Dry Needling, Electrical stimulation, Spinal mobilization, Cryotherapy, Moist heat, Manual therapy, and Re-evaluation   PLAN FOR NEXT SESSION: Progressive neck/postural/functional strengthening, motor control, ROM, and stretching exercises as tolerated. Manual therapy and dry needling to decrease muscle tension and joint mobility. Education.    Cira Rue, PT, DPT 02/12/2023, 1:28 PM  [facility] [address] [place], [place] [address] P: [phone] I F: [phone]  Addendum to correct title of note.  Luretha Murphy. Ilsa Iha, PT, DPT 02/16/23, 10:35 AM

## 2023-02-13 ENCOUNTER — Ambulatory Visit
Admission: RE | Admit: 2023-02-13 | Discharge: 2023-02-13 | Disposition: A | Discharge status: Home / Self Care | Payer: 59 | Source: Ambulatory Visit | Attending: Nurse Practitioner | Admitting: Nurse Practitioner

## 2023-02-13 DIAGNOSIS — Z981 Arthrodesis status: Secondary | ICD-10-CM | POA: Diagnosis present

## 2023-02-16 ENCOUNTER — Ambulatory Visit: Payer: 59 | Admitting: Physical Therapy

## 2023-02-16 ENCOUNTER — Encounter: Payer: Self-pay | Admitting: Physical Therapy

## 2023-02-16 DIAGNOSIS — M792 Neuralgia and neuritis, unspecified: Secondary | ICD-10-CM

## 2023-02-16 DIAGNOSIS — M542 Cervicalgia: Secondary | ICD-10-CM

## 2023-02-16 NOTE — Therapy (Signed)
OUTPATIENT PHYSICAL THERAPY  TREATMENT NOTE   Patient Name: Rodney Benjamin MRN: [id number] DOB:01/25/1961, 62 y.o., male Today's Date: 02/16/2023  END OF SESSION:  PT End of Session - 02/16/23 1035     Visit Number 11    Number of Visits 13    Date for PT Re-Evaluation 04/01/23    Authorization Type [employer] reporting period from 02/12/2023    Authorization Time Period VL: 60 per cal yr    Authorization - Visit Number 11    Authorization - Number of Visits 60    Progress Note Due on Visit 20    PT Start Time 1032    PT Stop Time 1110    PT Time Calculation (min) 38 min    Activity Tolerance Patient tolerated treatment well    Behavior During Therapy WFL for tasks assessed/performed                Past Medical History:  Diagnosis Date   History of chicken pox    Hypercholesterolemia    Hypertension    Past Surgical History:  Procedure Laterality Date   INNER EAR SURGERY     cholesteatoma removed x 2   KNEE SURGERY  1982   right   TONSILLECTOMY     Patient Active Problem List   Diagnosis Date Noted   Facial pain 01/10/2023   Ear pain 11/24/2022   Neck pain 08/22/2022   Tick bite of right hip 06/26/2022   Colon cancer screening 06/22/2021   Leg skin lesion, left 12/25/2019   Hemorrhoid 12/09/2018   Headache 12/15/2017   GERD (gastroesophageal reflux disease) 04/25/2017   Tachycardia 04/23/2017   CKD (chronic kidney disease), stage III (HCC) 10/12/2016   Stress 01/26/2016   Loss of weight 01/31/2015   Cervical disc herniation 12/25/2014   Back pain 10/24/2014   Health care maintenance 10/24/2014   Fluttering heart 06/19/2014   Left arm numbness 06/19/2014   Renal cyst 06/12/2013   Hypercholesterolemia 06/06/2012   Hypertension 03/23/2012    PCP: Dale Vernon, MD   REFERRING PROVIDER: Loni Dolly, MP (neurosurgery)   REFERRING DIAG: cervical radiculopathy   THERAPY DIAG:  Cervicalgia  Neuralgia and neuritis    Rationale for  Evaluation and Treatment: Rehabilitation   ONSET DATE: Approx February 2024   PERTINENT HISTORY:  Patient is a 62 y.o. male who presents to outpatient physical therapy with a referral for medical diagnosis cervical radiculopathy. This patient's chief complaints consist of right sided neck pain with radiation to right upper trap and radiation of paresthesia into the top of the right side of the head, leading to the following functional deficits: difficulty working (doesn't feel as sharp), decreased quality of life. Patient has Hypertension; Hypercholesterolemia; Renal cyst; Fluttering heart; Left arm numbness; Back pain; Health care maintenance; Cervical disc herniation; Loss of weight; Stress; CKD (chronic kidney disease), stage III (HCC); Tachycardia; GERD (gastroesophageal reflux disease); Headache; Hemorrhoid; Leg skin lesion, left; Tick bite of right hip; Neck pain; and Ear pain on their problem list. He  has a past medical history of History of Hypercholesterolemia, and Hypertension. He  has a past surgical history that includes Knee surgery (1982); Inner ear surgery; Tonsillectomy, C4-7 ACDF with Dr. Rise Mu 01/09/2015, surgery on the right ear 11/24/2022 for recurrent dermoid cyst abutting dehiscent sigmoid, meatoplasty performed and area around the sigmoid plate drilled down to exteriorize the cholesteatoma - matrix left over the sigmoid sinus due to risk of hemorrhage with removal. Patient denies hx of cancer,  stroke, seizures, lung problems, heart problems, diabetes, unexplained weight loss, unexplained changes in bowel or bladder problems, unexplained stumbling or dropping things, and osteoporosis.   SUBJECTIVE:                                                                                                                                                                                                          SUBJECTIVE STATEMENT: Patient states he had a really pretty good weekend. He states his  concordant symptoms at his head lit up some on Saturday evening and yesterday evening. He was sore over the afternoon of last PT session where he had the dry needling. He feels like the needling helped.    PAIN:  NPRS: 2/10 radiating up behind head and from the right ear.   PRECAUTIONS: keep the packing in his ear.      PATIENT GOALS: "get rid of the pain"   NEXT MD VISIT: no appointment scheduled currently, waiting to complete CT scan scheduled for 02/13/23   OBJECTIVE  TODAY'S TREATMENT:   Therapeutic exercise: to centralize symptoms and improve ROM, strength, muscular endurance, and activity tolerance required for successful completion of functional activities.  - Upper body ergometer level 7 to encourage joint nutrition, warm tissue, induce analgesic effect of aerobic exercise, improve muscular strength and endurance,  and prepare for remainder of session. 5:30 min.   Manual therapy: to reduce pain and tissue tension, improve range of motion, neuromodulation, in order to promote improved ability to complete functional activities. HOOKLYING/PRONE - STM to right upper trap   Modality: Dry needling performed to right upper trap to decrease pain and spasms along patient's right neck and head region with patient in hooklying and prone utilizing 3 dry needle(s) .30mm x 30mm with 6-8 sticks at right upper trap . Patient educated about the risks and benefits from therapy and verbally consents to treatment. Several twitch responses distributed across sticks.  Dry needling performed by Luretha Murphy. Ilsa Iha PT, DPT who is certified in this technique.   Pt required multimodal cuing for proper technique and to facilitate improved neuromuscular control, strength, range of motion, and functional ability resulting in improved performance and form.  PATIENT EDUCATION:  Education details: Exercise purpose/form. Self management techniques. Education on HEP including handout.  Person educated:  Patient Education method: Explanation, Demonstration, Tactile cues, Verbal cues, and Handouts Education comprehension: verbalized understanding, returned demonstration, tactile cues required, and needs further education   HOME EXERCISE PROGRAM: Access Code: [email or web address] URL: [email or web address] Date: 01/26/2023 Prepared by: Norton Blizzard  Exercises - Seated Levator Scapulae  Stretch  - 1-2 x daily - 3 sets - 30 seconds hold - Seated Upper Trapezius Stretch  - 1-2 x daily - 3 sets - 30 seconds hold - Shoulder External Rotation and Scapular Retraction with Resistance  - 1 x daily - 3 sets - 15 reps - Low Trap Setting at Wall  - 1 x daily - 3 sets - 10 reps - Quadruped Thoracic Rotation Full Range with Hand on Neck  - 3-5 x weekly - 2 sets - 10 reps - Quadruped Thoracic Rotation with Hand on Low Back  - 3-5 x weekly - 2 sets - 10 reps  Patient Education - Scientist, physiological - Ergonomics for Neck Discomfort - Introduction to Ergonomics  HOME EXERCISE PROGRAM [[id number]] View at [email or web address]" using code: [id number] Suboccipital and Economist  -  Repeat 10 Repetitions, Hold 5 Seconds, Perform 2 Times a Day  HOME EXERCISE PROGRAM [[id number]] View at "[email or web address]" using code: [id number] Prayer Stretch -  Repeat 20 Repetitions, Hold 5 Seconds, Complete 2 Sets, Perform 1 Times a Day   ASSESSMENT:   CLINICAL IMPRESSION: Patient arrived reporting feeling some relief from dry needling last PT session. Continued with aggressive approach to soft tissue treatment with manual therapy and dry needling this session. Patient with strong reproduction of pain behind the head and to the back of the ear with pressure and dry needling to trigger points in the right upper trap. Patient found sustained pressure and manual therapy to the region difficult to tolerate at times due to the intensity of reproduction of symptoms. He had several muscle twitches during needling and overall  tolerated interventions well. Patient would benefit from continued management of limiting condition by skilled physical therapist to address remaining impairments and functional limitations to work towards stated goals and return to PLOF or maximal functional independence.   From initial PT evaluation 01/07/2023:  Patient is a 62 y.o. male referred to outpatient physical therapy with a medical diagnosis of cervical radiculopathy who presents with signs and symptoms consistent with chronic right sided neck pain with radiation into right side of head and into upper trap. Patient's pain appears to be referring from tight and tender portions of right upper trap, cervical paraspinals, and SCM. No radiation identified from suboccipital region at initial eval. No reproduction of distal symptoms with Spurling's part B test at initial eval.  Patient does have significant hypomobility in thoracic and cervical spine with increased forward head posture that may also be contributing to symptoms. Patient presents with significant pain, ROM, joint stiffness, muscle tension, posture, paresthesia, muscle performance (strength/power/endurance), and activity tolerance impairments that are limiting ability to complete work and daily activities without difficulty and negatively effects quality of life. Patient will benefit from skilled physical therapy intervention to address current body structure impairments and activity limitations to improve function and work towards goals set in current POC in order to return to prior level of function or maximal functional improvement.    OBJECTIVE IMPAIRMENTS: decreased activity tolerance, decreased knowledge of condition, decreased mobility, decreased ROM, hypomobility, increased muscle spasms, impaired flexibility, impaired tone, improper body mechanics, postural dysfunction, and pain.    ACTIVITY LIMITATIONS: working, quality of life   PARTICIPATION LIMITATIONS: occupation   PERSONAL  FACTORS: Time since onset of injury/illness/exacerbation and 3+ comorbidities: Hypertension; Hypercholesterolemia; Renal cyst; Fluttering heart; Left arm numbness; Back pain; Health care maintenance; Cervical disc herniation; Loss of weight; Stress; CKD (chronic kidney disease), stage III (HCC); Tachycardia; GERD (gastroesophageal reflux disease); Headache; Hemorrhoid;  Leg skin lesion, left; Tick bite of right hip; Neck pain; and Ear pain on their problem list. He  has a past medical history of History of Hypercholesterolemia, and Hypertension. He  has a past surgical history that includes Knee surgery (1982); Inner ear surgery; Tonsillectomy, C4-7 ACDF with Dr. Rise Mu 01/09/2015, surgery on the right ear 11/24/2022 for recurrent dermoid cyst abutting dehiscent sigmoid, meatoplasty performed and area around the sigmoid plate drilled down to exteriorize the cholesteatoma - matrix left over the sigmoid sinus due to risk of hemorrhage with removal are also affecting patient's functional outcome.    REHAB POTENTIAL: Good   CLINICAL DECISION MAKING: Evolving/moderate complexity   EVALUATION COMPLEXITY: Moderate     GOALS: Goals reviewed with patient? No  SHORT TERM GOALS: Target date: 01/21/2023   Patient will be independent with initial home exercise program for self-management of symptoms. Baseline: Initial HEP provided at IE (01/07/23); Goal status: MET     LONG TERM GOALS: Target date: 04/01/2023   Patient will be independent with a long-term home exercise program for self-management of symptoms.  Baseline: Initial HEP provided at IE (01/07/23); participating 2-3x a day (02/12/2023);  Goal status: In-progress   2.  Patient will demonstrate improved FOTO to equal or greater than 75 by visit #10 to demonstrate improvement in overall condition and self-reported functional ability.  Baseline: 70 (01/07/23); 72 at visit #10 (02/12/2023);  Goal status: In-progress   3.  Patient will demonstrate  ability to complete end range right cervical spine rotation plus extension without increased symptoms to improve his ability to complete work activities.  Baseline: most strongly reproduces symptoms (01/07/23); still reproduces symptoms but not all the way to the top of his head (02/12/2023);  Goal status: In-progress   4.  Patient will demonstrate cervical spine AROM rotation equal or greater than 60 degrees bilaterally to improve ability to check blind spot when driving.  Baseline: R/L 40/50 (01/07/23); R/L 65/61 (02/12/2023);  Goal status: MET   5.  Patient will complete community, work and/or recreational activities with 75% less limitation due to current condition.  Baseline: difficulty with work and quality of life (01/07/23); estimates 45-50% (02/12/2023);  Goal status: In-progress       PLAN:   PT FREQUENCY: 1-2x/week   PT DURATION: 12 weeks   PLANNED INTERVENTIONS: Therapeutic exercises, Therapeutic activity, Neuromuscular re-education, Patient/Family education, Self Care, Joint mobilization, Dry Needling, Electrical stimulation, Spinal mobilization, Cryotherapy, Moist heat, Manual therapy, and Re-evaluation   PLAN FOR NEXT SESSION: Progressive neck/postural/functional strengthening, motor control, ROM, and stretching exercises as tolerated. Manual therapy and dry needling to decrease muscle tension and joint mobility. Education.    Cira Rue, PT, DPT 02/16/2023, 5:50 PM  [facility] [address] [place], [place] [address] P: [phone] I F: [phone]

## 2023-02-19 ENCOUNTER — Ambulatory Visit: Payer: 59 | Admitting: Physical Therapy

## 2023-02-19 ENCOUNTER — Encounter: Payer: Self-pay | Admitting: Physical Therapy

## 2023-02-19 DIAGNOSIS — M542 Cervicalgia: Secondary | ICD-10-CM | POA: Diagnosis not present

## 2023-02-19 DIAGNOSIS — M792 Neuralgia and neuritis, unspecified: Secondary | ICD-10-CM

## 2023-02-19 NOTE — Therapy (Signed)
OUTPATIENT PHYSICAL THERAPY  TREATMENT NOTE   Patient Name: Rodney Benjamin MRN: [id number] DOB:Apr 28, 1961, 62 y.o., male Today's Date: 02/19/2023  END OF SESSION:  PT End of Session - 02/19/23 1125     Visit Number 12    Number of Visits 13    Date for PT Re-Evaluation 04/01/23    Authorization Type [employer] reporting period from 02/12/2023    Authorization Time Period VL: 60 per cal yr    Authorization - Visit Number 12    Authorization - Number of Visits 60    Progress Note Due on Visit 20    PT Start Time 1124    PT Stop Time 1202    PT Time Calculation (min) 38 min    Activity Tolerance Patient tolerated treatment well    Behavior During Therapy WFL for tasks assessed/performed                Past Medical History:  Diagnosis Date   History of chicken pox    Hypercholesterolemia    Hypertension    Past Surgical History:  Procedure Laterality Date   INNER EAR SURGERY     cholesteatoma removed x 2   KNEE SURGERY  1982   right   TONSILLECTOMY     Patient Active Problem List   Diagnosis Date Noted   Facial pain 01/10/2023   Ear pain 11/24/2022   Neck pain 08/22/2022   Tick bite of right hip 06/26/2022   Colon cancer screening 06/22/2021   Leg skin lesion, left 12/25/2019   Hemorrhoid 12/09/2018   Headache 12/15/2017   GERD (gastroesophageal reflux disease) 04/25/2017   Tachycardia 04/23/2017   CKD (chronic kidney disease), stage III (HCC) 10/12/2016   Stress 01/26/2016   Loss of weight 01/31/2015   Cervical disc herniation 12/25/2014   Back pain 10/24/2014   Health care maintenance 10/24/2014   Fluttering heart 06/19/2014   Left arm numbness 06/19/2014   Renal cyst 06/12/2013   Hypercholesterolemia 06/06/2012   Hypertension 03/23/2012    PCP: Dale Mercer, MD   REFERRING PROVIDER: Loni Dolly, MP (neurosurgery)   REFERRING DIAG: cervical radiculopathy   THERAPY DIAG:  Cervicalgia  Neuralgia and neuritis    Rationale for  Evaluation and Treatment: Rehabilitation   ONSET DATE: Approx February 2024   PERTINENT HISTORY:  Patient is a 62 y.o. male who presents to outpatient physical therapy with a referral for medical diagnosis cervical radiculopathy. This patient's chief complaints consist of right sided neck pain with radiation to right upper trap and radiation of paresthesia into the top of the right side of the head, leading to the following functional deficits: difficulty working (doesn't feel as sharp), decreased quality of life. Patient has Hypertension; Hypercholesterolemia; Renal cyst; Fluttering heart; Left arm numbness; Back pain; Health care maintenance; Cervical disc herniation; Loss of weight; Stress; CKD (chronic kidney disease), stage III (HCC); Tachycardia; GERD (gastroesophageal reflux disease); Headache; Hemorrhoid; Leg skin lesion, left; Tick bite of right hip; Neck pain; and Ear pain on their problem list. He  has a past medical history of History of Hypercholesterolemia, and Hypertension. He  has a past surgical history that includes Knee surgery (1982); Inner ear surgery; Tonsillectomy, C4-7 ACDF with Dr. Rise Mu 01/09/2015, surgery on the right ear 11/24/2022 for recurrent dermoid cyst abutting dehiscent sigmoid, meatoplasty performed and area around the sigmoid plate drilled down to exteriorize the cholesteatoma - matrix left over the sigmoid sinus due to risk of hemorrhage with removal. Patient denies hx of cancer,  stroke, seizures, lung problems, heart problems, diabetes, unexplained weight loss, unexplained changes in bowel or bladder problems, unexplained stumbling or dropping things, and osteoporosis.   SUBJECTIVE:                                                                                                                                                                                                          SUBJECTIVE STATEMENT: Patient states he felt pretty good Monday afternoon and on Tuesday.  Then his pain returned intermittent and flared up yesterday afternoon and calmed down last night. This morning his pain was okay until he had to go to [facility] to pick up meat before doing his stretching this morning.    PAIN:  NPRS: 2-3/10 radiating up behind head and from the right ear and neck is stiff.    PRECAUTIONS: keep the packing in his ear.      PATIENT GOALS: "get rid of the pain"   NEXT MD VISIT: neck surgeon on 04/17/23   OBJECTIVE  TODAY'S TREATMENT:   Therapeutic exercise: to centralize symptoms and improve ROM, strength, muscular endurance, and activity tolerance required for successful completion of functional activities.  - Upper body ergometer level 7 to encourage joint nutrition, warm tissue, induce analgesic effect of aerobic exercise, improve muscular strength and endurance,  and prepare for remainder of session. 9 min.   Manual therapy: to reduce pain and tissue tension, improve range of motion, neuromodulation, in order to promote improved ability to complete functional activities. HOOKLYING/PRONE - STM to right upper trap   Modality: Dry needling performed to right upper trap to decrease pain and spasms along patient's right neck and head region with patient in hooklying and prone utilizing 1 dry needle .25mm x 40mm and 3 dry needle(s) .30mm x 30mm with many sticks at right upper trap. Patient educated about the risks and benefits from therapy and verbally consents to treatment. Several twitch responses distributed across sticks.  Dry needling performed by Luretha Murphy. Ilsa Iha PT, DPT who is certified in this technique.   Pt required multimodal cuing for proper technique and to facilitate improved neuromuscular control, strength, range of motion, and functional ability resulting in improved performance and form.  PATIENT EDUCATION:  Education details: Exercise purpose/form. Self management techniques. Education on HEP including handout.  Person educated:  Patient Education method: Explanation, Demonstration, Tactile cues, Verbal cues, and Handouts Education comprehension: verbalized understanding, returned demonstration, tactile cues required, and needs further education   HOME EXERCISE PROGRAM: Access Code: [email or web address] URL: [email or web address] Date: 01/26/2023 Prepared by: Norton Blizzard  Exercises - Seated Levator Scapulae Stretch  - 1-2 x daily - 3 sets - 30 seconds hold - Seated Upper Trapezius Stretch  - 1-2 x daily - 3 sets - 30 seconds hold - Shoulder External Rotation and Scapular Retraction with Resistance  - 1 x daily - 3 sets - 15 reps - Low Trap Setting at Wall  - 1 x daily - 3 sets - 10 reps - Quadruped Thoracic Rotation Full Range with Hand on Neck  - 3-5 x weekly - 2 sets - 10 reps - Quadruped Thoracic Rotation with Hand on Low Back  - 3-5 x weekly - 2 sets - 10 reps  Patient Education - Scientist, physiological - Ergonomics for Neck Discomfort - Introduction to Ergonomics  HOME EXERCISE PROGRAM [[id number]] View at [email or web address]" using code: [id number] Suboccipital and Economist  -  Repeat 10 Repetitions, Hold 5 Seconds, Perform 2 Times a Day  HOME EXERCISE PROGRAM [[id number]] View at "[email or web address]" using code: [id number] Prayer Stretch -  Repeat 20 Repetitions, Hold 5 Seconds, Complete 2 Sets, Perform 1 Times a Day   ASSESSMENT:   CLINICAL IMPRESSION: Patient arrived reporting improvement in concordant symptoms since last PT session for 1.5 days. Continued with dry needling today with patient reporting soreness but improved pain by end of session. It was more difficult to get twitch response today but still strongly reproduced symptoms in the head and back of R ear with pressure and dry needling to right upper trap. Patient would benefit from continued management of limiting condition by skilled physical therapist to address remaining impairments and functional limitations to work towards stated  goals and return to PLOF or maximal functional independence.   From initial PT evaluation 01/07/2023:  Patient is a 62 y.o. male referred to outpatient physical therapy with a medical diagnosis of cervical radiculopathy who presents with signs and symptoms consistent with chronic right sided neck pain with radiation into right side of head and into upper trap. Patient's pain appears to be referring from tight and tender portions of right upper trap, cervical paraspinals, and SCM. No radiation identified from suboccipital region at initial eval. No reproduction of distal symptoms with Spurling's part B test at initial eval.  Patient does have significant hypomobility in thoracic and cervical spine with increased forward head posture that may also be contributing to symptoms. Patient presents with significant pain, ROM, joint stiffness, muscle tension, posture, paresthesia, muscle performance (strength/power/endurance), and activity tolerance impairments that are limiting ability to complete work and daily activities without difficulty and negatively effects quality of life. Patient will benefit from skilled physical therapy intervention to address current body structure impairments and activity limitations to improve function and work towards goals set in current POC in order to return to prior level of function or maximal functional improvement.    OBJECTIVE IMPAIRMENTS: decreased activity tolerance, decreased knowledge of condition, decreased mobility, decreased ROM, hypomobility, increased muscle spasms, impaired flexibility, impaired tone, improper body mechanics, postural dysfunction, and pain.    ACTIVITY LIMITATIONS: working, quality of life   PARTICIPATION LIMITATIONS: occupation   PERSONAL FACTORS: Time since onset of injury/illness/exacerbation and 3+ comorbidities: Hypertension; Hypercholesterolemia; Renal cyst; Fluttering heart; Left arm numbness; Back pain; Health care maintenance; Cervical  disc herniation; Loss of weight; Stress; CKD (chronic kidney disease), stage III (HCC); Tachycardia; GERD (gastroesophageal reflux disease); Headache; Hemorrhoid; Leg skin lesion, left; Tick bite of right hip; Neck pain; and Ear pain on their problem list. He  has a past medical history of  History of Hypercholesterolemia, and Hypertension. He  has a past surgical history that includes Knee surgery (1982); Inner ear surgery; Tonsillectomy, C4-7 ACDF with Dr. Rise Mu 01/09/2015, surgery on the right ear 11/24/2022 for recurrent dermoid cyst abutting dehiscent sigmoid, meatoplasty performed and area around the sigmoid plate drilled down to exteriorize the cholesteatoma - matrix left over the sigmoid sinus due to risk of hemorrhage with removal are also affecting patient's functional outcome.    REHAB POTENTIAL: Good   CLINICAL DECISION MAKING: Evolving/moderate complexity   EVALUATION COMPLEXITY: Moderate     GOALS: Goals reviewed with patient? No  SHORT TERM GOALS: Target date: 01/21/2023   Patient will be independent with initial home exercise program for self-management of symptoms. Baseline: Initial HEP provided at IE (01/07/23); Goal status: MET     LONG TERM GOALS: Target date: 04/01/2023   Patient will be independent with a long-term home exercise program for self-management of symptoms.  Baseline: Initial HEP provided at IE (01/07/23); participating 2-3x a day (02/12/2023);  Goal status: In-progress   2.  Patient will demonstrate improved FOTO to equal or greater than 75 by visit #10 to demonstrate improvement in overall condition and self-reported functional ability.  Baseline: 70 (01/07/23); 72 at visit #10 (02/12/2023);  Goal status: In-progress   3.  Patient will demonstrate ability to complete end range right cervical spine rotation plus extension without increased symptoms to improve his ability to complete work activities.  Baseline: most strongly reproduces symptoms (01/07/23);  still reproduces symptoms but not all the way to the top of his head (02/12/2023);  Goal status: In-progress   4.  Patient will demonstrate cervical spine AROM rotation equal or greater than 60 degrees bilaterally to improve ability to check blind spot when driving.  Baseline: R/L 40/50 (01/07/23); R/L 65/61 (02/12/2023);  Goal status: MET   5.  Patient will complete community, work and/or recreational activities with 75% less limitation due to current condition.  Baseline: difficulty with work and quality of life (01/07/23); estimates 45-50% (02/12/2023);  Goal status: In-progress       PLAN:   PT FREQUENCY: 1-2x/week   PT DURATION: 12 weeks   PLANNED INTERVENTIONS: Therapeutic exercises, Therapeutic activity, Neuromuscular re-education, Patient/Family education, Self Care, Joint mobilization, Dry Needling, Electrical stimulation, Spinal mobilization, Cryotherapy, Moist heat, Manual therapy, and Re-evaluation   PLAN FOR NEXT SESSION: Progressive neck/postural/functional strengthening, motor control, ROM, and stretching exercises as tolerated. Manual therapy and dry needling to decrease muscle tension and joint mobility. Education.    Cira Rue, PT, DPT 02/19/2023, 1:03 PM  [facility] [address] [place], [place] [address] P: [phone] I F: [phone]

## 2023-02-26 ENCOUNTER — Ambulatory Visit: Payer: 59 | Admitting: Physical Therapy

## 2023-03-02 ENCOUNTER — Encounter: Payer: Self-pay | Admitting: Physical Therapy

## 2023-03-02 ENCOUNTER — Ambulatory Visit: Payer: 59 | Attending: Nurse Practitioner | Admitting: Physical Therapy

## 2023-03-02 DIAGNOSIS — M792 Neuralgia and neuritis, unspecified: Secondary | ICD-10-CM | POA: Diagnosis present

## 2023-03-02 DIAGNOSIS — M542 Cervicalgia: Secondary | ICD-10-CM | POA: Insufficient documentation

## 2023-03-02 NOTE — Therapy (Signed)
OUTPATIENT PHYSICAL THERAPY  TREATMENT NOTE   Patient Name: Rodney Benjamin MRN: [id number] DOB:06/19/1961, 62 y.o., male Today's Date: 03/02/2023  END OF SESSION:  PT End of Session - 03/02/23 1519     Visit Number 13    Number of Visits 25    Date for PT Re-Evaluation 04/01/23    Authorization Type [employer] reporting period from 02/12/2023    Authorization Time Period VL: 60 per cal yr    Authorization - Visit Number 13    Authorization - Number of Visits 60    Progress Note Due on Visit 20    PT Start Time 1519    PT Stop Time 1602    PT Time Calculation (min) 43 min    Activity Tolerance Patient tolerated treatment well    Behavior During Therapy WFL for tasks assessed/performed                 Past Medical History:  Diagnosis Date   History of chicken pox    Hypercholesterolemia    Hypertension    Past Surgical History:  Procedure Laterality Date   INNER EAR SURGERY     cholesteatoma removed x 2   KNEE SURGERY  1982   right   TONSILLECTOMY     Patient Active Problem List   Diagnosis Date Noted   Facial pain 01/10/2023   Ear pain 11/24/2022   Neck pain 08/22/2022   Tick bite of right hip 06/26/2022   Colon cancer screening 06/22/2021   Leg skin lesion, left 12/25/2019   Hemorrhoid 12/09/2018   Headache 12/15/2017   GERD (gastroesophageal reflux disease) 04/25/2017   Tachycardia 04/23/2017   CKD (chronic kidney disease), stage III (HCC) 10/12/2016   Stress 01/26/2016   Loss of weight 01/31/2015   Cervical disc herniation 12/25/2014   Back pain 10/24/2014   Health care maintenance 10/24/2014   Fluttering heart 06/19/2014   Left arm numbness 06/19/2014   Renal cyst 06/12/2013   Hypercholesterolemia 06/06/2012   Hypertension 03/23/2012    PCP: Dale [place], MD   REFERRING PROVIDER: Loni Dolly, MP (neurosurgery)   REFERRING DIAG: cervical radiculopathy   THERAPY DIAG:  Cervicalgia  Neuralgia and neuritis    Rationale for  Evaluation and Treatment: Rehabilitation   ONSET DATE: Approx February 2024   PERTINENT HISTORY:  Patient is a 62 y.o. male who presents to outpatient physical therapy with a referral for medical diagnosis cervical radiculopathy. This patient's chief complaints consist of right sided neck pain with radiation to right upper trap and radiation of paresthesia into the top of the right side of the head, leading to the following functional deficits: difficulty working (doesn't feel as sharp), decreased quality of life. Patient has Hypertension; Hypercholesterolemia; Renal cyst; Fluttering heart; Left arm numbness; Back pain; Health care maintenance; Cervical disc herniation; Loss of weight; Stress; CKD (chronic kidney disease), stage III (HCC); Tachycardia; GERD (gastroesophageal reflux disease); Headache; Hemorrhoid; Leg skin lesion, left; Tick bite of right hip; Neck pain; and Ear pain on their problem list. He  has a past medical history of History of Hypercholesterolemia, and Hypertension. He  has a past surgical history that includes Knee surgery (1982); Inner ear surgery; Tonsillectomy, C4-7 ACDF with Dr. Rise Mu 01/09/2015, surgery on the right ear 11/24/2022 for recurrent dermoid cyst abutting dehiscent sigmoid, meatoplasty performed and area around the sigmoid plate drilled down to exteriorize the cholesteatoma - matrix left over the sigmoid sinus due to risk of hemorrhage with removal. Patient denies hx of  cancer, stroke, seizures, lung problems, heart problems, diabetes, unexplained weight loss, unexplained changes in bowel or bladder problems, unexplained stumbling or dropping things, and osteoporosis.   SUBJECTIVE:                                                                                                                                                                                                          SUBJECTIVE STATEMENT: Patient states he almost felt normal yesterday morning, which he  has not felt since January. He states his symptoms continue to come and go, worse during the week and better on the weekends. He states his pain has been a bit faired up today. He gets periodic tingling in his left hand when he stands up after prolonged sitting.    PAIN:  NPRS: 2-3/10 radiating up behind head and at right ear to right eye area.    PRECAUTIONS: keep the packing in his ear.      PATIENT GOALS: "get rid of the pain"   NEXT MD VISIT: neck surgeon on 04/17/23   OBJECTIVE  TODAY'S TREATMENT:   Therapeutic exercise: to centralize symptoms and improve ROM, strength, muscular endurance, and activity tolerance required for successful completion of functional activities.  - Upper body ergometer level 5 to encourage joint nutrition, warm tissue, induce analgesic effect of aerobic exercise, improve muscular strength and endurance,  and prepare for remainder of session. 5 min.  - supine deep neck flexor to lift, 2x20 seconds preceded by education and trial on how to contract just deep neck flexors with nod.  - supine deep neck flexor nod, 5x30 seconds (15 second rest between sets) - supine deep neck flexor to lift, 3x20 seconds (20 second rest between sets)  Manual therapy: to reduce pain and tissue tension, improve range of motion, neuromodulation, in order to promote improved ability to complete functional activities. HOOKLYING/PRONE - STM to right upper trap including sustained pressure - STM to right SCM  Modality: Dry needling performed to right upper trap to decrease pain and spasms along patient's right neck and head region with patient in supine utilizing 3 dry needle(s) .30mm x 30mm with 1 stick at right SCM and several sticks at right upper trap. Patient educated about the risks and benefits from therapy and verbally consents to treatment. Several twitch responses distributed across sticks.  Dry needling performed by Luretha Murphy. Ilsa Iha PT, DPT who is certified in this technique.    Pt required multimodal cuing for proper technique and to facilitate improved neuromuscular control, strength, range of motion, and functional ability resulting  in improved performance and form.  PATIENT EDUCATION:  Education details: Exercise purpose/form. Self management techniques. Education on HEP including handout.  Person educated: Patient Education method: Explanation, Demonstration, Tactile cues, Verbal cues, and Handouts Education comprehension: verbalized understanding, returned demonstration, tactile cues required, and needs further education   HOME EXERCISE PROGRAM: Access Code: [email or web address] URL: [email or web address] Date: 01/26/2023 Prepared by: Norton Blizzard  Exercises - Seated Levator Scapulae Stretch  - 1-2 x daily - 3 sets - 30 seconds hold - Seated Upper Trapezius Stretch  - 1-2 x daily - 3 sets - 30 seconds hold - Shoulder External Rotation and Scapular Retraction with Resistance  - 1 x daily - 3 sets - 15 reps - Low Trap Setting at Wall  - 1 x daily - 3 sets - 10 reps - Quadruped Thoracic Rotation Full Range with Hand on Neck  - 3-5 x weekly - 2 sets - 10 reps - Quadruped Thoracic Rotation with Hand on Low Back  - 3-5 x weekly - 2 sets - 10 reps  Patient Education - Scientist, physiological - Ergonomics for Neck Discomfort - Introduction to Ergonomics  HOME EXERCISE PROGRAM [[id number]] View at [email or web address]" using code: [id number] Suboccipital and Economist  -  Repeat 10 Repetitions, Hold 5 Seconds, Perform 2 Times a Day  HOME EXERCISE PROGRAM [[id number]] View at "[email or web address]" using code: [id number] Prayer Stretch -  Repeat 20 Repetitions, Hold 5 Seconds, Complete 2 Sets, Perform 1 Times a Day   ASSESSMENT:   CLINICAL IMPRESSION: Patient arrived with continued symptoms but some report of improvement in relief time/quality. Today's session introduced deep neck flexor strengthening at patient's prompting as well as continued with  interventions to decrease tension in musculature. Patient had strong reproduction of symptoms with sustained pressure and dry needling at right upper trap. Patient demonstrated improved ability to perform right rotation with extension (asterisk sign) by end of session with less radiation to head.   Patient would benefit from continued management of limiting condition by skilled physical therapist to address remaining impairments and functional limitations to work towards stated goals and return to PLOF or maximal functional independence.   From initial PT evaluation 01/07/2023:  Patient is a 62 y.o. male referred to outpatient physical therapy with a medical diagnosis of cervical radiculopathy who presents with signs and symptoms consistent with chronic right sided neck pain with radiation into right side of head and into upper trap. Patient's pain appears to be referring from tight and tender portions of right upper trap, cervical paraspinals, and SCM. No radiation identified from suboccipital region at initial eval. No reproduction of distal symptoms with Spurling's part B test at initial eval.  Patient does have significant hypomobility in thoracic and cervical spine with increased forward head posture that may also be contributing to symptoms. Patient presents with significant pain, ROM, joint stiffness, muscle tension, posture, paresthesia, muscle performance (strength/power/endurance), and activity tolerance impairments that are limiting ability to complete work and daily activities without difficulty and negatively effects quality of life. Patient will benefit from skilled physical therapy intervention to address current body structure impairments and activity limitations to improve function and work towards goals set in current POC in order to return to prior level of function or maximal functional improvement.    OBJECTIVE IMPAIRMENTS: decreased activity tolerance, decreased knowledge of condition,  decreased mobility, decreased ROM, hypomobility, increased muscle spasms, impaired flexibility, impaired tone, improper body mechanics, postural dysfunction, and pain.    ACTIVITY LIMITATIONS: working, quality of life  PARTICIPATION LIMITATIONS: occupation   PERSONAL FACTORS: Time since onset of injury/illness/exacerbation and 3+ comorbidities: Hypertension; Hypercholesterolemia; Renal cyst; Fluttering heart; Left arm numbness; Back pain; Health care maintenance; Cervical disc herniation; Loss of weight; Stress; CKD (chronic kidney disease), stage III (HCC); Tachycardia; GERD (gastroesophageal reflux disease); Headache; Hemorrhoid; Leg skin lesion, left; Tick bite of right hip; Neck pain; and Ear pain on their problem list. He  has a past medical history of History of Hypercholesterolemia, and Hypertension. He  has a past surgical history that includes Knee surgery (1982); Inner ear surgery; Tonsillectomy, C4-7 ACDF with Dr. Rise Mu 01/09/2015, surgery on the right ear 11/24/2022 for recurrent dermoid cyst abutting dehiscent sigmoid, meatoplasty performed and area around the sigmoid plate drilled down to exteriorize the cholesteatoma - matrix left over the sigmoid sinus due to risk of hemorrhage with removal are also affecting patient's functional outcome.    REHAB POTENTIAL: Good   CLINICAL DECISION MAKING: Evolving/moderate complexity   EVALUATION COMPLEXITY: Moderate     GOALS: Goals reviewed with patient? No  SHORT TERM GOALS: Target date: 01/21/2023   Patient will be independent with initial home exercise program for self-management of symptoms. Baseline: Initial HEP provided at IE (01/07/23); Goal status: MET     LONG TERM GOALS: Target date: 04/01/2023   Patient will be independent with a long-term home exercise program for self-management of symptoms.  Baseline: Initial HEP provided at IE (01/07/23); participating 2-3x a day (02/12/2023);  Goal status: In-progress   2.  Patient will  demonstrate improved FOTO to equal or greater than 75 by visit #10 to demonstrate improvement in overall condition and self-reported functional ability.  Baseline: 70 (01/07/23); 72 at visit #10 (02/12/2023);  Goal status: In-progress   3.  Patient will demonstrate ability to complete end range right cervical spine rotation plus extension without increased symptoms to improve his ability to complete work activities.  Baseline: most strongly reproduces symptoms (01/07/23); still reproduces symptoms but not all the way to the top of his head (02/12/2023);  Goal status: In-progress   4.  Patient will demonstrate cervical spine AROM rotation equal or greater than 60 degrees bilaterally to improve ability to check blind spot when driving.  Baseline: R/L 40/50 (01/07/23); R/L 65/61 (02/12/2023);  Goal status: MET   5.  Patient will complete community, work and/or recreational activities with 75% less limitation due to current condition.  Baseline: difficulty with work and quality of life (01/07/23); estimates 45-50% (02/12/2023);  Goal status: In-progress       PLAN:   PT FREQUENCY: 1-2x/week   PT DURATION: 12 weeks   PLANNED INTERVENTIONS: Therapeutic exercises, Therapeutic activity, Neuromuscular re-education, Patient/Family education, Self Care, Joint mobilization, Dry Needling, Electrical stimulation, Spinal mobilization, Cryotherapy, Moist heat, Manual therapy, and Re-evaluation   PLAN FOR NEXT SESSION: Progressive neck/postural/functional strengthening, motor control, ROM, and stretching exercises as tolerated. Manual therapy and dry needling to decrease muscle tension and joint mobility. Education.    Cira Rue, PT, DPT 03/02/2023, 5:20 PM  [facility] [address] [place], [place] [address] P: [phone] I F: [phone]

## 2023-03-09 ENCOUNTER — Encounter: Payer: Self-pay | Admitting: Physical Therapy

## 2023-03-09 ENCOUNTER — Ambulatory Visit: Payer: 59 | Admitting: Physical Therapy

## 2023-03-09 DIAGNOSIS — M542 Cervicalgia: Secondary | ICD-10-CM

## 2023-03-09 DIAGNOSIS — M792 Neuralgia and neuritis, unspecified: Secondary | ICD-10-CM

## 2023-03-09 NOTE — Therapy (Signed)
OUTPATIENT PHYSICAL THERAPY  TREATMENT NOTE   Patient Name: Rodney Benjamin MRN: [id number] DOB:1961/03/21, 62 y.o., male Today's Date: 03/09/2023  END OF SESSION:  PT End of Session - 03/09/23 1608     Visit Number 14    Number of Visits 25    Date for PT Re-Evaluation 04/01/23    Authorization Type [employer] reporting period from 02/12/2023    Authorization Time Period VL: 60 per cal yr    Authorization - Visit Number 14    Authorization - Number of Visits 60    Progress Note Due on Visit 20    PT Start Time 1620    PT Stop Time 1654    PT Time Calculation (min) 34 min    Activity Tolerance Patient tolerated treatment well    Behavior During Therapy WFL for tasks assessed/performed             Past Medical History:  Diagnosis Date   History of chicken pox    Hypercholesterolemia    Hypertension    Past Surgical History:  Procedure Laterality Date   INNER EAR SURGERY     cholesteatoma removed x 2   KNEE SURGERY  1982   right   TONSILLECTOMY     Patient Active Problem List   Diagnosis Date Noted   Facial pain 01/10/2023   Ear pain 11/24/2022   Neck pain 08/22/2022   Tick bite of right hip 06/26/2022   Colon cancer screening 06/22/2021   Leg skin lesion, left 12/25/2019   Hemorrhoid 12/09/2018   Headache 12/15/2017   GERD (gastroesophageal reflux disease) 04/25/2017   Tachycardia 04/23/2017   CKD (chronic kidney disease), stage III (HCC) 10/12/2016   Stress 01/26/2016   Loss of weight 01/31/2015   Cervical disc herniation 12/25/2014   Back pain 10/24/2014   Health care maintenance 10/24/2014   Fluttering heart 06/19/2014   Left arm numbness 06/19/2014   Renal cyst 06/12/2013   Hypercholesterolemia 06/06/2012   Hypertension 03/23/2012    PCP: Dale La Sal, MD   REFERRING PROVIDER: Loni Dolly, MP (neurosurgery)   REFERRING DIAG: cervical radiculopathy   THERAPY DIAG:  Cervicalgia  Neuralgia and neuritis    Rationale for  Evaluation and Treatment: Rehabilitation   ONSET DATE: Approx February 2024   PERTINENT HISTORY:  Patient is a 62 y.o. male who presents to outpatient physical therapy with a referral for medical diagnosis cervical radiculopathy. This patient's chief complaints consist of right sided neck pain with radiation to right upper trap and radiation of paresthesia into the top of the right side of the head, leading to the following functional deficits: difficulty working (doesn't feel as sharp), decreased quality of life. Patient has Hypertension; Hypercholesterolemia; Renal cyst; Fluttering heart; Left arm numbness; Back pain; Health care maintenance; Cervical disc herniation; Loss of weight; Stress; CKD (chronic kidney disease), stage III (HCC); Tachycardia; GERD (gastroesophageal reflux disease); Headache; Hemorrhoid; Leg skin lesion, left; Tick bite of right hip; Neck pain; and Ear pain on their problem list. He  has a past medical history of History of Hypercholesterolemia, and Hypertension. He  has a past surgical history that includes Knee surgery (1982); Inner ear surgery; Tonsillectomy, C4-7 ACDF with Dr. Rise Mu 01/09/2015, surgery on the right ear 11/24/2022 for recurrent dermoid cyst abutting dehiscent sigmoid, meatoplasty performed and area around the sigmoid plate drilled down to exteriorize the cholesteatoma - matrix left over the sigmoid sinus due to risk of hemorrhage with removal. Patient denies hx of cancer, stroke, seizures, lung  problems, heart problems, diabetes, unexplained weight loss, unexplained changes in bowel or bladder problems, unexplained stumbling or dropping things, and osteoporosis.   SUBJECTIVE:                                                                                                                                                                                                          SUBJECTIVE STATEMENT: Patient states he felt close to normal for about 2 days after last  PT session, then he started to get some of his concordant pain. He really felt pretty good for most of last week until it really flared up yesterday. He was not as active as normal for a Sunday yesterday. He did his stretches in the evening and that helped some. It has been bothering him some today.    PAIN:  NPRS: 2/10 radiating up behind head and at right ear to right eye area.    PRECAUTIONS: keep the packing in his ear.      PATIENT GOALS: "get rid of the pain"   NEXT MD VISIT: neck surgeon on 04/17/23   OBJECTIVE  TODAY'S TREATMENT:   Therapeutic exercise: to centralize symptoms and improve ROM, strength, muscular endurance, and activity tolerance required for successful completion of functional activities.  - NuStep level 7 using bilateral upper and lower extremities. Seat/handle setting 14/15. For improved extremity mobility, muscular endurance, and activity tolerance; and to induce the analgesic effect of aerobic exercise, stimulate improved joint nutrition, and prepare body structures and systems for following interventions. x 5  minutes. Average SPM = 89.  Manual therapy: to reduce pain and tissue tension, improve range of motion, neuromodulation, in order to promote improved ability to complete functional activities. HOOKLYING/PRONE - STM to right upper trap including sustained pressure  Modality: Dry needling performed to right upper trap to decrease pain and spasms along patient's right neck and head region with patient in supine utilizing 3 dry needle(s) .25mm x 40mm with 5 sticks at right upper trap. Patient educated about the risks and benefits from therapy and verbally consents to treatment. Several twitch responses distributed across sticks.  Dry needling performed by Luretha Murphy. Ilsa Iha PT, DPT who is certified in this technique.   Pt required multimodal cuing for proper technique and to facilitate improved neuromuscular control, strength, range of motion, and functional  ability resulting in improved performance and form.  PATIENT EDUCATION:  Education details: Exercise purpose/form. Self management techniques. Education on HEP including handout.  Person educated: Patient Education method: Explanation, Demonstration, Tactile cues, Verbal cues, and Handouts Education comprehension:  verbalized understanding, returned demonstration, tactile cues required, and needs further education   HOME EXERCISE PROGRAM: Access Code: [email or web address] URL: [email or web address] Date: 01/26/2023 Prepared by: Norton Blizzard  Exercises - Seated Levator Scapulae Stretch  - 1-2 x daily - 3 sets - 30 seconds hold - Seated Upper Trapezius Stretch  - 1-2 x daily - 3 sets - 30 seconds hold - Shoulder External Rotation and Scapular Retraction with Resistance  - 1 x daily - 3 sets - 15 reps - Low Trap Setting at Wall  - 1 x daily - 3 sets - 10 reps - Quadruped Thoracic Rotation Full Range with Hand on Neck  - 3-5 x weekly - 2 sets - 10 reps - Quadruped Thoracic Rotation with Hand on Low Back  - 3-5 x weekly - 2 sets - 10 reps  Patient Education - Scientist, physiological - Ergonomics for Neck Discomfort - Introduction to Ergonomics  HOME EXERCISE PROGRAM [[id number]] View at "[email or web address]" using code: [id number] Suboccipital and Economist  -  Repeat 10 Repetitions, Hold 5 Seconds, Perform 2 Times a Day  HOME EXERCISE PROGRAM [[id number]] View at "[email or web address]" using code: [id number] Prayer Stretch -  Repeat 20 Repetitions, Hold 5 Seconds, Complete 2 Sets, Perform 1 Times a Day   ASSESSMENT:   CLINICAL IMPRESSION: Patient arrives with good pain control for 2 days after last dry needling session with return of pain over the next few days. Today's session continued to focus on dry needling and STM with sustained pressure at the right upper trap that caused strong reproduction of patient's concordant symptoms. Plan to complete needling sessions more close together  this week to see if it can improve carry over. Right upper trap continues to be very tight and produce concordant pain. Patient would benefit from continued management of limiting condition by skilled physical therapist to address remaining impairments and functional limitations to work towards stated goals and return to PLOF or maximal functional independence.   From initial PT evaluation 01/07/2023:  Patient is a 62 y.o. male referred to outpatient physical therapy with a medical diagnosis of cervical radiculopathy who presents with signs and symptoms consistent with chronic right sided neck pain with radiation into right side of head and into upper trap. Patient's pain appears to be referring from tight and tender portions of right upper trap, cervical paraspinals, and SCM. No radiation identified from suboccipital region at initial eval. No reproduction of distal symptoms with Spurling's part B test at initial eval.  Patient does have significant hypomobility in thoracic and cervical spine with increased forward head posture that may also be contributing to symptoms. Patient presents with significant pain, ROM, joint stiffness, muscle tension, posture, paresthesia, muscle performance (strength/power/endurance), and activity tolerance impairments that are limiting ability to complete work and daily activities without difficulty and negatively effects quality of life. Patient will benefit from skilled physical therapy intervention to address current body structure impairments and activity limitations to improve function and work towards goals set in current POC in order to return to prior level of function or maximal functional improvement.    OBJECTIVE IMPAIRMENTS: decreased activity tolerance, decreased knowledge of condition, decreased mobility, decreased ROM, hypomobility, increased muscle spasms, impaired flexibility, impaired tone, improper body mechanics, postural dysfunction, and pain.    ACTIVITY  LIMITATIONS: working, quality of life   PARTICIPATION LIMITATIONS: occupation   PERSONAL FACTORS: Time since onset of injury/illness/exacerbation and 3+ comorbidities: Hypertension; Hypercholesterolemia; Renal cyst; Fluttering heart; Left arm numbness; Back pain; Health care maintenance; Cervical  disc herniation; Loss of weight; Stress; CKD (chronic kidney disease), stage III (HCC); Tachycardia; GERD (gastroesophageal reflux disease); Headache; Hemorrhoid; Leg skin lesion, left; Tick bite of right hip; Neck pain; and Ear pain on their problem list. He  has a past medical history of History of Hypercholesterolemia, and Hypertension. He  has a past surgical history that includes Knee surgery (1982); Inner ear surgery; Tonsillectomy, C4-7 ACDF with Dr. Rise Mu 01/09/2015, surgery on the right ear 11/24/2022 for recurrent dermoid cyst abutting dehiscent sigmoid, meatoplasty performed and area around the sigmoid plate drilled down to exteriorize the cholesteatoma - matrix left over the sigmoid sinus due to risk of hemorrhage with removal are also affecting patient's functional outcome.    REHAB POTENTIAL: Good   CLINICAL DECISION MAKING: Evolving/moderate complexity   EVALUATION COMPLEXITY: Moderate     GOALS: Goals reviewed with patient? No  SHORT TERM GOALS: Target date: 01/21/2023   Patient will be independent with initial home exercise program for self-management of symptoms. Baseline: Initial HEP provided at IE (01/07/23); Goal status: MET     LONG TERM GOALS: Target date: 04/01/2023   Patient will be independent with a long-term home exercise program for self-management of symptoms.  Baseline: Initial HEP provided at IE (01/07/23); participating 2-3x a day (02/12/2023);  Goal status: In-progress   2.  Patient will demonstrate improved FOTO to equal or greater than 75 by visit #10 to demonstrate improvement in overall condition and self-reported functional ability.  Baseline: 70 (01/07/23);  72 at visit #10 (02/12/2023);  Goal status: In-progress   3.  Patient will demonstrate ability to complete end range right cervical spine rotation plus extension without increased symptoms to improve his ability to complete work activities.  Baseline: most strongly reproduces symptoms (01/07/23); still reproduces symptoms but not all the way to the top of his head (02/12/2023);  Goal status: In-progress   4.  Patient will demonstrate cervical spine AROM rotation equal or greater than 60 degrees bilaterally to improve ability to check blind spot when driving.  Baseline: R/L 40/50 (01/07/23); R/L 65/61 (02/12/2023);  Goal status: MET   5.  Patient will complete community, work and/or recreational activities with 75% less limitation due to current condition.  Baseline: difficulty with work and quality of life (01/07/23); estimates 45-50% (02/12/2023);  Goal status: In-progress       PLAN:   PT FREQUENCY: 1-2x/week   PT DURATION: 12 weeks   PLANNED INTERVENTIONS: Therapeutic exercises, Therapeutic activity, Neuromuscular re-education, Patient/Family education, Self Care, Joint mobilization, Dry Needling, Electrical stimulation, Spinal mobilization, Cryotherapy, Moist heat, Manual therapy, and Re-evaluation   PLAN FOR NEXT SESSION: Progressive neck/postural/functional strengthening, motor control, ROM, and stretching exercises as tolerated. Manual therapy and dry needling to decrease muscle tension and joint mobility. Education.    Cira Rue, PT, DPT 03/09/2023, 4:13 PM  [facility] [address] [place], [place] [address] P: [phone] I F: [phone]

## 2023-03-12 ENCOUNTER — Ambulatory Visit: Payer: 59 | Admitting: Physical Therapy

## 2023-03-12 ENCOUNTER — Encounter: Payer: Self-pay | Admitting: Physical Therapy

## 2023-03-12 DIAGNOSIS — M792 Neuralgia and neuritis, unspecified: Secondary | ICD-10-CM

## 2023-03-12 DIAGNOSIS — M542 Cervicalgia: Secondary | ICD-10-CM

## 2023-03-12 NOTE — Therapy (Signed)
OUTPATIENT PHYSICAL THERAPY TREATMENT   Patient Name: Rodney Benjamin MRN: [id number] DOB:Oct 19, 1960, 62 y.o., male Today's Date: 03/12/2023  END OF SESSION:  PT End of Session - 03/12/23 1030     Visit Number 15    Number of Visits 25    Date for PT Re-Evaluation 04/01/23    Authorization Type [employer] reporting period from 02/12/2023    Authorization Time Period VL: 60 per cal yr    Authorization - Visit Number 15    Authorization - Number of Visits 60    Progress Note Due on Visit 20    PT Start Time 1030    PT Stop Time 1110    PT Time Calculation (min) 40 min    Activity Tolerance Patient tolerated treatment well    Behavior During Therapy WFL for tasks assessed/performed              Past Medical History:  Diagnosis Date   History of chicken pox    Hypercholesterolemia    Hypertension    Past Surgical History:  Procedure Laterality Date   INNER EAR SURGERY     cholesteatoma removed x 2   KNEE SURGERY  1982   right   TONSILLECTOMY     Patient Active Problem List   Diagnosis Date Noted   Facial pain 01/10/2023   Ear pain 11/24/2022   Neck pain 08/22/2022   Tick bite of right hip 06/26/2022   Colon cancer screening 06/22/2021   Leg skin lesion, left 12/25/2019   Hemorrhoid 12/09/2018   Headache 12/15/2017   GERD (gastroesophageal reflux disease) 04/25/2017   Tachycardia 04/23/2017   CKD (chronic kidney disease), stage III (HCC) 10/12/2016   Stress 01/26/2016   Loss of weight 01/31/2015   Cervical disc herniation 12/25/2014   Back pain 10/24/2014   Health care maintenance 10/24/2014   Fluttering heart 06/19/2014   Left arm numbness 06/19/2014   Renal cyst 06/12/2013   Hypercholesterolemia 06/06/2012   Hypertension 03/23/2012    PCP: Dale [place], MD   REFERRING PROVIDER: Loni Dolly, MP (neurosurgery)   REFERRING DIAG: cervical radiculopathy   THERAPY DIAG:  Cervicalgia  Neuralgia and neuritis    Rationale for Evaluation  and Treatment: Rehabilitation   ONSET DATE: Approx February 2024   PERTINENT HISTORY:  Patient is a 62 y.o. male who presents to outpatient physical therapy with a referral for medical diagnosis cervical radiculopathy. This patient's chief complaints consist of right sided neck pain with radiation to right upper trap and radiation of paresthesia into the top of the right side of the head, leading to the following functional deficits: difficulty working (doesn't feel as sharp), decreased quality of life. Patient has Hypertension; Hypercholesterolemia; Renal cyst; Fluttering heart; Left arm numbness; Back pain; Health care maintenance; Cervical disc herniation; Loss of weight; Stress; CKD (chronic kidney disease), stage III (HCC); Tachycardia; GERD (gastroesophageal reflux disease); Headache; Hemorrhoid; Leg skin lesion, left; Tick bite of right hip; Neck pain; and Ear pain on their problem list. He  has a past medical history of History of Hypercholesterolemia, and Hypertension. He  has a past surgical history that includes Knee surgery (1982); Inner ear surgery; Tonsillectomy, C4-7 ACDF with Dr. Rise Mu 01/09/2015, surgery on the right ear 11/24/2022 for recurrent dermoid cyst abutting dehiscent sigmoid, meatoplasty performed and area around the sigmoid plate drilled down to exteriorize the cholesteatoma - matrix left over the sigmoid sinus due to risk of hemorrhage with removal. Patient denies hx of cancer, stroke, seizures, lung problems,  heart problems, diabetes, unexplained weight loss, unexplained changes in bowel or bladder problems, unexplained stumbling or dropping things, and osteoporosis.   SUBJECTIVE:                                                                                                                                                                                                          SUBJECTIVE STATEMENT: Patient states he saw the surgeon yesterday, who recommended surgery. He  states his surgeon said that there is something like a bone spur at C3-C4 which is pressing on the spinal cord. He though this was leading to the pain going up into his head. Patient states he gets temporary relief from physical therapy and would like to continue physical therapy until his surgery, which has been scheduled October 7th. He has been getting relief after each PT session for about 2 days, especially better the afternoon following treatment.     PAIN:  NPRS: 2/10 radiating up behind head and at right ear to right eye area.    PRECAUTIONS: keep the packing in his ear.      PATIENT GOALS: "get rid of the pain"   NEXT MD VISIT: neck surgeon on 04/17/23   OBJECTIVE  TODAY'S TREATMENT:   Therapeutic exercise: to centralize symptoms and improve ROM, strength, muscular endurance, and activity tolerance required for successful completion of functional activities.  - NuStep level 7 using bilateral upper and lower extremities. Seat/handle setting 14/15. For improved extremity mobility, muscular endurance, and activity tolerance; and to induce the analgesic effect of aerobic exercise, stimulate improved joint nutrition, and prepare body structures and systems for following interventions. x 8  minutes. Average SPM = 89.  Manual therapy: to reduce pain and tissue tension, improve range of motion, neuromodulation, in order to promote improved ability to complete functional activities. HOOKLYING - STM to right upper trap including sustained pressure  Modality: Dry needling performed to right upper trap to decrease pain and spasms along patient's right neck and head region with patient in supine utilizing 4 dry needle(s) .25mm x 40mm with 5 sticks at right upper trap. Patient educated about the risks and benefits from therapy and verbally consents to treatment. no twitch responses noted but twice had sudden shooting pain down the back - needle immediately removed and no lasting increased pain.    Dry needling performed by Luretha Murphy. Ilsa Iha PT, DPT who is certified in this technique.   Pt required multimodal cuing for proper technique and to facilitate improved neuromuscular control, strength, range of motion, and functional ability resulting in  improved performance and form.  PATIENT EDUCATION:  Education details: Exercise purpose/form. Self management techniques. Education on HEP including handout.  Person educated: Patient Education method: Explanation, Demonstration, Tactile cues, Verbal cues, and Handouts Education comprehension: verbalized understanding, returned demonstration, tactile cues required, and needs further education   HOME EXERCISE PROGRAM: Access Code: [email or web address] URL: [email or web address] Date: 01/26/2023 Prepared by: Norton Blizzard  Exercises - Seated Levator Scapulae Stretch  - 1-2 x daily - 3 sets - 30 seconds hold - Seated Upper Trapezius Stretch  - 1-2 x daily - 3 sets - 30 seconds hold - Shoulder External Rotation and Scapular Retraction with Resistance  - 1 x daily - 3 sets - 15 reps - Low Trap Setting at Wall  - 1 x daily - 3 sets - 10 reps - Quadruped Thoracic Rotation Full Range with Hand on Neck  - 3-5 x weekly - 2 sets - 10 reps - Quadruped Thoracic Rotation with Hand on Low Back  - 3-5 x weekly - 2 sets - 10 reps  Patient Education - Scientist, physiological - Ergonomics for Neck Discomfort - Introduction to Ergonomics  HOME EXERCISE PROGRAM [[id number]] View at "[email or web address]" using code: [id number] Suboccipital and Economist  -  Repeat 10 Repetitions, Hold 5 Seconds, Perform 2 Times a Day  HOME EXERCISE PROGRAM [[id number]] View at "[email or web address]" using code: [id number] Prayer Stretch -  Repeat 20 Repetitions, Hold 5 Seconds, Complete 2 Sets, Perform 1 Times a Day   ASSESSMENT:   CLINICAL IMPRESSION: Patient arrives reporting continued good but temporary response to dry needling and manual interventions, but no lasting  relief. He also reports his surgeon recommended surgery which has been scheduled for 03/30/2023. Patient's quality of life is significantly higher with the temporary relief over 2-3 days folloiwng each PT session, and continued PT up to his surgery is medically necessary to preserve function and quality of life during this time. Patient would benefit from continued management of limiting condition by skilled physical therapist to address remaining impairments and functional limitations to work towards stated goals and return to PLOF or maximal functional independence.   From initial PT evaluation 01/07/2023:  Patient is a 62 y.o. male referred to outpatient physical therapy with a medical diagnosis of cervical radiculopathy who presents with signs and symptoms consistent with chronic right sided neck pain with radiation into right side of head and into upper trap. Patient's pain appears to be referring from tight and tender portions of right upper trap, cervical paraspinals, and SCM. No radiation identified from suboccipital region at initial eval. No reproduction of distal symptoms with Spurling's part B test at initial eval.  Patient does have significant hypomobility in thoracic and cervical spine with increased forward head posture that may also be contributing to symptoms. Patient presents with significant pain, ROM, joint stiffness, muscle tension, posture, paresthesia, muscle performance (strength/power/endurance), and activity tolerance impairments that are limiting ability to complete work and daily activities without difficulty and negatively effects quality of life. Patient will benefit from skilled physical therapy intervention to address current body structure impairments and activity limitations to improve function and work towards goals set in current POC in order to return to prior level of function or maximal functional improvement.    OBJECTIVE IMPAIRMENTS: decreased activity tolerance,  decreased knowledge of condition, decreased mobility, decreased ROM, hypomobility, increased muscle spasms, impaired flexibility, impaired tone, improper body mechanics, postural dysfunction, and pain.    ACTIVITY LIMITATIONS: working, quality of life   PARTICIPATION LIMITATIONS: occupation  PERSONAL FACTORS: Time since onset of injury/illness/exacerbation and 3+ comorbidities: Hypertension; Hypercholesterolemia; Renal cyst; Fluttering heart; Left arm numbness; Back pain; Health care maintenance; Cervical disc herniation; Loss of weight; Stress; CKD (chronic kidney disease), stage III (HCC); Tachycardia; GERD (gastroesophageal reflux disease); Headache; Hemorrhoid; Leg skin lesion, left; Tick bite of right hip; Neck pain; and Ear pain on their problem list. He  has a past medical history of History of Hypercholesterolemia, and Hypertension. He  has a past surgical history that includes Knee surgery (1982); Inner ear surgery; Tonsillectomy, C4-7 ACDF with Dr. Rise Mu 01/09/2015, surgery on the right ear 11/24/2022 for recurrent dermoid cyst abutting dehiscent sigmoid, meatoplasty performed and area around the sigmoid plate drilled down to exteriorize the cholesteatoma - matrix left over the sigmoid sinus due to risk of hemorrhage with removal are also affecting patient's functional outcome.    REHAB POTENTIAL: Good   CLINICAL DECISION MAKING: Evolving/moderate complexity   EVALUATION COMPLEXITY: Moderate     GOALS: Goals reviewed with patient? No  SHORT TERM GOALS: Target date: 01/21/2023   Patient will be independent with initial home exercise program for self-management of symptoms. Baseline: Initial HEP provided at IE (01/07/23); Goal status: MET     LONG TERM GOALS: Target date: 04/01/2023   Patient will be independent with a long-term home exercise program for self-management of symptoms.  Baseline: Initial HEP provided at IE (01/07/23); participating 2-3x a day (02/12/2023);  Goal  status: In-progress   2.  Patient will demonstrate improved FOTO to equal or greater than 75 by visit #10 to demonstrate improvement in overall condition and self-reported functional ability.  Baseline: 70 (01/07/23); 72 at visit #10 (02/12/2023);  Goal status: In-progress   3.  Patient will demonstrate ability to complete end range right cervical spine rotation plus extension without increased symptoms to improve his ability to complete work activities.  Baseline: most strongly reproduces symptoms (01/07/23); still reproduces symptoms but not all the way to the top of his head (02/12/2023);  Goal status: In-progress   4.  Patient will demonstrate cervical spine AROM rotation equal or greater than 60 degrees bilaterally to improve ability to check blind spot when driving.  Baseline: R/L 40/50 (01/07/23); R/L 65/61 (02/12/2023);  Goal status: MET   5.  Patient will complete community, work and/or recreational activities with 75% less limitation due to current condition.  Baseline: difficulty with work and quality of life (01/07/23); estimates 45-50% (02/12/2023);  Goal status: In-progress       PLAN:   PT FREQUENCY: 1-2x/week   PT DURATION: 12 weeks   PLANNED INTERVENTIONS: Therapeutic exercises, Therapeutic activity, Neuromuscular re-education, Patient/Family education, Self Care, Joint mobilization, Dry Needling, Electrical stimulation, Spinal mobilization, Cryotherapy, Moist heat, Manual therapy, and Re-evaluation   PLAN FOR NEXT SESSION: Progressive neck/postural/functional strengthening, motor control, ROM, and stretching exercises as tolerated. Manual therapy and dry needling to decrease muscle tension and joint mobility. Education.    Cira Rue, PT, DPT 03/12/2023, 11:44 AM  [facility] [address] [place], [place] [address] P: [phone] I F: [phone]

## 2023-03-16 ENCOUNTER — Ambulatory Visit: Payer: 59 | Admitting: Physical Therapy

## 2023-03-16 ENCOUNTER — Encounter: Payer: Self-pay | Admitting: Physical Therapy

## 2023-03-16 DIAGNOSIS — M542 Cervicalgia: Secondary | ICD-10-CM

## 2023-03-16 DIAGNOSIS — M792 Neuralgia and neuritis, unspecified: Secondary | ICD-10-CM

## 2023-03-16 NOTE — Therapy (Unsigned)
OUTPATIENT PHYSICAL THERAPY TREATMENT   Patient Name: Rodney Benjamin MRN: [id number] DOB:[birth year], 62 y.o., male Today's Date: 03/16/2023  END OF SESSION:  PT End of Session - 03/16/23 2032     Visit Number 16    Number of Visits 25    Date for PT Re-Evaluation 04/01/23    Authorization Type [employer] reporting period from 02/12/2023    Authorization Time Period VL: 60 per cal yr    Authorization - Visit Number 16    Authorization - Number of Visits 60    Progress Note Due on Visit 20    PT Start Time 1350    PT Stop Time 1430    PT Time Calculation (min) 40 min    Activity Tolerance Patient tolerated treatment well    Behavior During Therapy WFL for tasks assessed/performed               Past Medical History:  Diagnosis Date   History of chicken pox    Hypercholesterolemia    Hypertension    Past Surgical History:  Procedure Laterality Date   INNER EAR SURGERY     cholesteatoma removed x 2   KNEE SURGERY  1982   right   TONSILLECTOMY     Patient Active Problem List   Diagnosis Date Noted   Facial pain 01/10/2023   Ear pain 11/24/2022   Neck pain 08/22/2022   Tick bite of right hip 06/26/2022   Colon cancer screening 06/22/2021   Leg skin lesion, left 12/25/2019   Hemorrhoid 12/09/2018   Headache 12/15/2017   GERD (gastroesophageal reflux disease) 04/25/2017   Tachycardia 04/23/2017   CKD (chronic kidney disease), stage III (HCC) 10/12/2016   Stress 01/26/2016   Loss of weight 01/31/2015   Cervical disc herniation 12/25/2014   Back pain 10/24/2014   Health care maintenance 10/24/2014   Fluttering heart 06/19/2014   Left arm numbness 06/19/2014   Renal cyst 06/12/2013   Hypercholesterolemia 06/06/2012   Hypertension 03/23/2012    PCP: Dale Mansfield, MD   REFERRING PROVIDER: Loni Dolly, MP (neurosurgery)   REFERRING DIAG: cervical radiculopathy   THERAPY DIAG:  Cervicalgia  Neuralgia and neuritis    Rationale for Evaluation  and Treatment: Rehabilitation   ONSET DATE: Approx February 2024   PERTINENT HISTORY:  Patient is a 62 y.o. male who presents to outpatient physical therapy with a referral for medical diagnosis cervical radiculopathy. This patient's chief complaints consist of right sided neck pain with radiation to right upper trap and radiation of paresthesia into the top of the right side of the head, leading to the following functional deficits: difficulty working (doesn't feel as sharp), decreased quality of life. Patient has Hypertension; Hypercholesterolemia; Renal cyst; Fluttering heart; Left arm numbness; Back pain; Health care maintenance; Cervical disc herniation; Loss of weight; Stress; CKD (chronic kidney disease), stage III (HCC); Tachycardia; GERD (gastroesophageal reflux disease); Headache; Hemorrhoid; Leg skin lesion, left; Tick bite of right hip; Neck pain; and Ear pain on their problem list. He  has a past medical history of History of Hypercholesterolemia, and Hypertension. He  has a past surgical history that includes Knee surgery (1982); Inner ear surgery; Tonsillectomy, C4-7 ACDF with Dr. Rise Mu 01/09/2015, surgery on the right ear 11/24/2022 for recurrent dermoid cyst abutting dehiscent sigmoid, meatoplasty performed and area around the sigmoid plate drilled down to exteriorize the cholesteatoma - matrix left over the sigmoid sinus due to risk of hemorrhage with removal. Patient denies hx of cancer, stroke, seizures, lung  problems, heart problems, diabetes, unexplained weight loss, unexplained changes in bowel or bladder problems, unexplained stumbling or dropping things, and osteoporosis.   SUBJECTIVE:                                                                                                                                                                                                          SUBJECTIVE STATEMENT: Patient states his neck and head pain is flaired up this morning, but he had  improvement in symptoms through the weekend after last PT session.    PAIN:  NPRS: 2/10 radiating up behind head and at right ear to right eye area.    PRECAUTIONS: keep the packing in his ear.      PATIENT GOALS: "get rid of the pain"   NEXT MD VISIT: neck surgeon on 04/17/23   OBJECTIVE  TODAY'S TREATMENT:   Therapeutic exercise: to centralize symptoms and improve ROM, strength, muscular endurance, and activity tolerance required for successful completion of functional activities.  - NuStep level 7 using bilateral upper and lower extremities. Seat/handle setting 14/15. For improved extremity mobility, muscular endurance, and activity tolerance; and to induce the analgesic effect of aerobic exercise, stimulate improved joint nutrition, and prepare body structures and systems for following interventions. x 8  minutes. Average SPM = 89. - hooklying deep neck flexor nod and lift, 5x30 seconds.   Manual therapy: to reduce pain and tissue tension, improve range of motion, neuromodulation, in order to promote improved ability to complete functional activities. HOOKLYING - STM to right upper trap including sustained pressure  Modality: Dry needling performed to right upper trap to decrease pain and spasms along patient's right neck and head region with patient in supine utilizing 2 dry needle(s) .25mm x 40mm with 3 sticks at right upper trap. Patient educated about the risks and benefits from therapy and verbally consents to treatment. Dry needling performed by Luretha Murphy. Ilsa Iha PT, DPT who is certified in this technique.   Pt required multimodal cuing for proper technique and to facilitate improved neuromuscular control, strength, range of motion, and functional ability resulting in improved performance and form.  PATIENT EDUCATION:  Education details: Exercise purpose/form. Self management techniques. Education on HEP including handout.  Person educated: Patient Education method: Explanation,  Demonstration, Tactile cues, Verbal cues, and Handouts Education comprehension: verbalized understanding, returned demonstration, tactile cues required, and needs further education   HOME EXERCISE PROGRAM: Access Code: [email or web address] URL: [email or web address] Date: 01/26/2023 Prepared by: Norton Blizzard  Exercises - Seated Levator Scapulae Stretch  - 1-2 x daily - 3  sets - 30 seconds hold - Seated Upper Trapezius Stretch  - 1-2 x daily - 3 sets - 30 seconds hold - Shoulder External Rotation and Scapular Retraction with Resistance  - 1 x daily - 3 sets - 15 reps - Low Trap Setting at Wall  - 1 x daily - 3 sets - 10 reps - Quadruped Thoracic Rotation Full Range with Hand on Neck  - 3-5 x weekly - 2 sets - 10 reps - Quadruped Thoracic Rotation with Hand on Low Back  - 3-5 x weekly - 2 sets - 10 reps  Patient Education - Scientist, physiological - Ergonomics for Neck Discomfort - Introduction to Ergonomics  HOME EXERCISE PROGRAM [[id number]] View at [email or web address]" using code: [id number] Suboccipital and Economist  -  Repeat 10 Repetitions, Hold 5 Seconds, Perform 2 Times a Day  HOME EXERCISE PROGRAM [[id number]] View at "[email or web address]" using code: [id number] Prayer Stretch -  Repeat 20 Repetitions, Hold 5 Seconds, Complete 2 Sets, Perform 1 Times a Day   ASSESSMENT:   CLINICAL IMPRESSION: Patient arrives reporting good response to dry needling after last PT session, with improvement lasting almost 3 days. Continued focus on soft tissue mobilization and dry needling this session with continued deep neck flexor strengthening to help prepare for surgery and improve quality of life up until surgery is performed. Patient would benefit from continued management of limiting condition by skilled physical therapist to address remaining impairments and functional limitations to work towards stated goals and return to PLOF or maximal functional independence.   From initial PT  evaluation 01/07/2023:  Patient is a 62 y.o. male referred to outpatient physical therapy with a medical diagnosis of cervical radiculopathy who presents with signs and symptoms consistent with chronic right sided neck pain with radiation into right side of head and into upper trap. Patient's pain appears to be referring from tight and tender portions of right upper trap, cervical paraspinals, and SCM. No radiation identified from suboccipital region at initial eval. No reproduction of distal symptoms with Spurling's part B test at initial eval.  Patient does have significant hypomobility in thoracic and cervical spine with increased forward head posture that may also be contributing to symptoms. Patient presents with significant pain, ROM, joint stiffness, muscle tension, posture, paresthesia, muscle performance (strength/power/endurance), and activity tolerance impairments that are limiting ability to complete work and daily activities without difficulty and negatively effects quality of life. Patient will benefit from skilled physical therapy intervention to address current body structure impairments and activity limitations to improve function and work towards goals set in current POC in order to return to prior level of function or maximal functional improvement.    OBJECTIVE IMPAIRMENTS: decreased activity tolerance, decreased knowledge of condition, decreased mobility, decreased ROM, hypomobility, increased muscle spasms, impaired flexibility, impaired tone, improper body mechanics, postural dysfunction, and pain.    ACTIVITY LIMITATIONS: working, quality of life   PARTICIPATION LIMITATIONS: occupation   PERSONAL FACTORS: Time since onset of injury/illness/exacerbation and 3+ comorbidities: Hypertension; Hypercholesterolemia; Renal cyst; Fluttering heart; Left arm numbness; Back pain; Health care maintenance; Cervical disc herniation; Loss of weight; Stress; CKD (chronic kidney disease), stage III  (HCC); Tachycardia; GERD (gastroesophageal reflux disease); Headache; Hemorrhoid; Leg skin lesion, left; Tick bite of right hip; Neck pain; and Ear pain on their problem list. He  has a past medical history of History of Hypercholesterolemia, and Hypertension. He  has a past surgical history that includes Knee surgery (1982); Inner ear surgery; Tonsillectomy, C4-7 ACDF with  Dr. Rise Mu 01/09/2015, surgery on the right ear 11/24/2022 for recurrent dermoid cyst abutting dehiscent sigmoid, meatoplasty performed and area around the sigmoid plate drilled down to exteriorize the cholesteatoma - matrix left over the sigmoid sinus due to risk of hemorrhage with removal are also affecting patient's functional outcome.    REHAB POTENTIAL: Good   CLINICAL DECISION MAKING: Evolving/moderate complexity   EVALUATION COMPLEXITY: Moderate     GOALS: Goals reviewed with patient? No  SHORT TERM GOALS: Target date: 01/21/2023   Patient will be independent with initial home exercise program for self-management of symptoms. Baseline: Initial HEP provided at IE (01/07/23); Goal status: MET     LONG TERM GOALS: Target date: 04/01/2023   Patient will be independent with a long-term home exercise program for self-management of symptoms.  Baseline: Initial HEP provided at IE (01/07/23); participating 2-3x a day (02/12/2023);  Goal status: In-progress   2.  Patient will demonstrate improved FOTO to equal or greater than 75 by visit #10 to demonstrate improvement in overall condition and self-reported functional ability.  Baseline: 70 (01/07/23); 72 at visit #10 (02/12/2023);  Goal status: In-progress   3.  Patient will demonstrate ability to complete end range right cervical spine rotation plus extension without increased symptoms to improve his ability to complete work activities.  Baseline: most strongly reproduces symptoms (01/07/23); still reproduces symptoms but not all the way to the top of his head (02/12/2023);   Goal status: In-progress   4.  Patient will demonstrate cervical spine AROM rotation equal or greater than 60 degrees bilaterally to improve ability to check blind spot when driving.  Baseline: R/L 40/50 (01/07/23); R/L 65/61 (02/12/2023);  Goal status: MET   5.  Patient will complete community, work and/or recreational activities with 75% less limitation due to current condition.  Baseline: difficulty with work and quality of life (01/07/23); estimates 45-50% (02/12/2023);  Goal status: In-progress       PLAN:   PT FREQUENCY: 1-2x/week   PT DURATION: 12 weeks   PLANNED INTERVENTIONS: Therapeutic exercises, Therapeutic activity, Neuromuscular re-education, Patient/Family education, Self Care, Joint mobilization, Dry Needling, Electrical stimulation, Spinal mobilization, Cryotherapy, Moist heat, Manual therapy, and Re-evaluation   PLAN FOR NEXT SESSION: Progressive neck/postural/functional strengthening, motor control, ROM, and stretching exercises as tolerated. Manual therapy and dry needling to decrease muscle tension and joint mobility. Education.    Cira Rue, PT, DPT 03/16/2023, 8:34 PM  [facility] [address] [place], [place] [address] P: [phone] I F: [phone]

## 2023-03-19 ENCOUNTER — Encounter: Payer: Self-pay | Admitting: Physical Therapy

## 2023-03-19 ENCOUNTER — Ambulatory Visit: Payer: 59 | Admitting: Physical Therapy

## 2023-03-19 DIAGNOSIS — M542 Cervicalgia: Secondary | ICD-10-CM | POA: Diagnosis not present

## 2023-03-19 DIAGNOSIS — M792 Neuralgia and neuritis, unspecified: Secondary | ICD-10-CM

## 2023-03-19 NOTE — Therapy (Signed)
OUTPATIENT PHYSICAL THERAPY TREATMENT   Patient Name: Rodney Benjamin MRN: [id number] DOB:1961-04-02, 62 y.o., male Today's Date: 03/19/2023  END OF SESSION:  PT End of Session - 03/19/23 1614     Visit Number 17    Number of Visits 25    Date for PT Re-Evaluation 04/01/23    Authorization Type [employer] reporting period from 02/12/2023    Authorization Time Period VL: 60 per cal yr    Authorization - Visit Number 17    Authorization - Number of Visits 60    Progress Note Due on Visit 20    PT Start Time 1528    PT Stop Time 1600    PT Time Calculation (min) 32 min    Activity Tolerance Patient tolerated treatment well    Behavior During Therapy WFL for tasks assessed/performed               Past Medical History:  Diagnosis Date   History of chicken pox    Hypercholesterolemia    Hypertension    Past Surgical History:  Procedure Laterality Date   INNER EAR SURGERY     cholesteatoma removed x 2   KNEE SURGERY  1982   right   TONSILLECTOMY     Patient Active Problem List   Diagnosis Date Noted   Facial pain 01/10/2023   Ear pain 11/24/2022   Neck pain 08/22/2022   Tick bite of right hip 06/26/2022   Colon cancer screening 06/22/2021   Leg skin lesion, left 12/25/2019   Hemorrhoid 12/09/2018   Headache 12/15/2017   GERD (gastroesophageal reflux disease) 04/25/2017   Tachycardia 04/23/2017   CKD (chronic kidney disease), stage III (HCC) 10/12/2016   Stress 01/26/2016   Loss of weight 01/31/2015   Cervical disc herniation 12/25/2014   Back pain 10/24/2014   Health care maintenance 10/24/2014   Fluttering heart 06/19/2014   Left arm numbness 06/19/2014   Renal cyst 06/12/2013   Hypercholesterolemia 06/06/2012   Hypertension 03/23/2012    PCP: Dale Arma, MD   REFERRING PROVIDER: Loni Dolly, MP (neurosurgery)   REFERRING DIAG: cervical radiculopathy   THERAPY DIAG:  Cervicalgia  Neuralgia and neuritis    Rationale for Evaluation  and Treatment: Rehabilitation   ONSET DATE: Approx February 2024   PERTINENT HISTORY:  Patient is a 62 y.o. male who presents to outpatient physical therapy with a referral for medical diagnosis cervical radiculopathy. This patient's chief complaints consist of right sided neck pain with radiation to right upper trap and radiation of paresthesia into the top of the right side of the head, leading to the following functional deficits: difficulty working (doesn't feel as sharp), decreased quality of life. Patient has Hypertension; Hypercholesterolemia; Renal cyst; Fluttering heart; Left arm numbness; Back pain; Health care maintenance; Cervical disc herniation; Loss of weight; Stress; CKD (chronic kidney disease), stage III (HCC); Tachycardia; GERD (gastroesophageal reflux disease); Headache; Hemorrhoid; Leg skin lesion, left; Tick bite of right hip; Neck pain; and Ear pain on their problem list. He  has a past medical history of History of Hypercholesterolemia, and Hypertension. He  has a past surgical history that includes Knee surgery (1982); Inner ear surgery; Tonsillectomy, C4-7 ACDF with Dr. Rise Mu 01/09/2015, surgery on the right ear 11/24/2022 for recurrent dermoid cyst abutting dehiscent sigmoid, meatoplasty performed and area around the sigmoid plate drilled down to exteriorize the cholesteatoma - matrix left over the sigmoid sinus due to risk of hemorrhage with removal. Patient denies hx of cancer, stroke, seizures, lung  problems, heart problems, diabetes, unexplained weight loss, unexplained changes in bowel or bladder problems, unexplained stumbling or dropping things, and osteoporosis.   SUBJECTIVE:                                                                                                                                                                                                          SUBJECTIVE STATEMENT: Patient states his neck and head symptoms have been pretty stable since last  PT session.    PAIN:  NPRS: 2/10 radiating up behind head and at right ear to right eye area.    PRECAUTIONS: keep the packing in his ear.      PATIENT GOALS: "get rid of the pain"   NEXT MD VISIT: neck surgeon on 04/17/23   OBJECTIVE  TODAY'S TREATMENT:   Therapeutic exercise: to centralize symptoms and improve ROM, strength, muscular endurance, and activity tolerance required for successful completion of functional activities.  - Upper body ergometer level 7 to encourage joint nutrition, warm tissue, induce analgesic effect of aerobic exercise, improve muscular strength and endurance,  and prepare for remainder of session. 6 minutes changing directions every 1 minute.   Manual therapy: to reduce pain and tissue tension, improve range of motion, neuromodulation, in order to promote improved ability to complete functional activities. HOOKLYING - STM to right upper trap including sustained pressure  Modality: Dry needling performed to right upper trap to decrease pain and spasms along patient's right neck and head region with patient in supine utilizing 1 dry needle(s) .25mm x 40mm with 3 sticks at right upper trap. Patient educated about the risks and benefits from therapy and verbally consents to treatment. Dry needling performed by Luretha Murphy. Ilsa Iha PT, DPT who is certified in this technique.   Pt required multimodal cuing for proper technique and to facilitate improved neuromuscular control, strength, range of motion, and functional ability resulting in improved performance and form.  PATIENT EDUCATION:  Education details: Exercise purpose/form. Self management techniques. Education on HEP including handout.  Person educated: Patient Education method: Explanation, Demonstration, Tactile cues, Verbal cues, and Handouts Education comprehension: verbalized understanding, returned demonstration, tactile cues required, and needs further education   HOME EXERCISE PROGRAM: Access Code:  [email or web address] URL: [email or web address] Date: 01/26/2023 Prepared by: Norton Blizzard  Exercises - Seated Levator Scapulae Stretch  - 1-2 x daily - 3 sets - 30 seconds hold - Seated Upper Trapezius Stretch  - 1-2 x daily - 3 sets - 30 seconds hold - Shoulder External Rotation and Scapular Retraction with Resistance  - 1 x  daily - 3 sets - 15 reps - Low Trap Setting at Wall  - 1 x daily - 3 sets - 10 reps - Quadruped Thoracic Rotation Full Range with Hand on Neck  - 3-5 x weekly - 2 sets - 10 reps - Quadruped Thoracic Rotation with Hand on Low Back  - 3-5 x weekly - 2 sets - 10 reps  Patient Education - Scientist, physiological - Ergonomics for Neck Discomfort - Introduction to Ergonomics  HOME EXERCISE PROGRAM [[id number]] View at "[email or web address]" using code: [id number] Suboccipital and Economist  -  Repeat 10 Repetitions, Hold 5 Seconds, Perform 2 Times a Day  HOME EXERCISE PROGRAM [[id number]] View at "[email or web address]" using code: [id number] Prayer Stretch -  Repeat 20 Repetitions, Hold 5 Seconds, Complete 2 Sets, Perform 1 Times a Day   ASSESSMENT:   CLINICAL IMPRESSION: Patient arrives reporting continued management of worst pain since dry needling and manual therapy earlier this week. Continued with similar interventions to help control pain and preserve quality of life until patient undergoes surgery on 03/30/2023. Patient tolerated session well and continues to have reproduction of symptoms with palpation to right upper trap (most at the distal aspect). Plan to continue with one more PT visit next week to help keep pain in check before surgery.   From initial PT evaluation 01/07/2023:  Patient is a 62 y.o. male referred to outpatient physical therapy with a medical diagnosis of cervical radiculopathy who presents with signs and symptoms consistent with chronic right sided neck pain with radiation into right side of head and into upper trap. Patient's pain appears to be  referring from tight and tender portions of right upper trap, cervical paraspinals, and SCM. No radiation identified from suboccipital region at initial eval. No reproduction of distal symptoms with Spurling's part B test at initial eval.  Patient does have significant hypomobility in thoracic and cervical spine with increased forward head posture that may also be contributing to symptoms. Patient presents with significant pain, ROM, joint stiffness, muscle tension, posture, paresthesia, muscle performance (strength/power/endurance), and activity tolerance impairments that are limiting ability to complete work and daily activities without difficulty and negatively effects quality of life. Patient will benefit from skilled physical therapy intervention to address current body structure impairments and activity limitations to improve function and work towards goals set in current POC in order to return to prior level of function or maximal functional improvement.    OBJECTIVE IMPAIRMENTS: decreased activity tolerance, decreased knowledge of condition, decreased mobility, decreased ROM, hypomobility, increased muscle spasms, impaired flexibility, impaired tone, improper body mechanics, postural dysfunction, and pain.    ACTIVITY LIMITATIONS: working, quality of life   PARTICIPATION LIMITATIONS: occupation   PERSONAL FACTORS: Time since onset of injury/illness/exacerbation and 3+ comorbidities: Hypertension; Hypercholesterolemia; Renal cyst; Fluttering heart; Left arm numbness; Back pain; Health care maintenance; Cervical disc herniation; Loss of weight; Stress; CKD (chronic kidney disease), stage III (HCC); Tachycardia; GERD (gastroesophageal reflux disease); Headache; Hemorrhoid; Leg skin lesion, left; Tick bite of right hip; Neck pain; and Ear pain on their problem list. He  has a past medical history of History of Hypercholesterolemia, and Hypertension. He  has a past surgical history that includes Knee  surgery (1982); Inner ear surgery; Tonsillectomy, C4-7 ACDF with Dr. Rise Mu 01/09/2015, surgery on the right ear 11/24/2022 for recurrent dermoid cyst abutting dehiscent sigmoid, meatoplasty performed and area around the sigmoid plate drilled down to exteriorize the cholesteatoma - matrix left over the sigmoid sinus due to risk of hemorrhage  with removal are also affecting patient's functional outcome.    REHAB POTENTIAL: Good   CLINICAL DECISION MAKING: Evolving/moderate complexity   EVALUATION COMPLEXITY: Moderate     GOALS: Goals reviewed with patient? No  SHORT TERM GOALS: Target date: 01/21/2023   Patient will be independent with initial home exercise program for self-management of symptoms. Baseline: Initial HEP provided at IE (01/07/23); Goal status: MET     LONG TERM GOALS: Target date: 04/01/2023   Patient will be independent with a long-term home exercise program for self-management of symptoms.  Baseline: Initial HEP provided at IE (01/07/23); participating 2-3x a day (02/12/2023);  Goal status: In-progress   2.  Patient will demonstrate improved FOTO to equal or greater than 75 by visit #10 to demonstrate improvement in overall condition and self-reported functional ability.  Baseline: 70 (01/07/23); 72 at visit #10 (02/12/2023);  Goal status: In-progress   3.  Patient will demonstrate ability to complete end range right cervical spine rotation plus extension without increased symptoms to improve his ability to complete work activities.  Baseline: most strongly reproduces symptoms (01/07/23); still reproduces symptoms but not all the way to the top of his head (02/12/2023);  Goal status: In-progress   4.  Patient will demonstrate cervical spine AROM rotation equal or greater than 60 degrees bilaterally to improve ability to check blind spot when driving.  Baseline: R/L 40/50 (01/07/23); R/L 65/61 (02/12/2023);  Goal status: MET   5.  Patient will complete community, work  and/or recreational activities with 75% less limitation due to current condition.  Baseline: difficulty with work and quality of life (01/07/23); estimates 45-50% (02/12/2023);  Goal status: In-progress       PLAN:   PT FREQUENCY: 1-2x/week   PT DURATION: 12 weeks   PLANNED INTERVENTIONS: Therapeutic exercises, Therapeutic activity, Neuromuscular re-education, Patient/Family education, Self Care, Joint mobilization, Dry Needling, Electrical stimulation, Spinal mobilization, Cryotherapy, Moist heat, Manual therapy, and Re-evaluation   PLAN FOR NEXT SESSION: Progressive neck/postural/functional strengthening, motor control, ROM, and stretching exercises as tolerated. Manual therapy and dry needling to decrease muscle tension and joint mobility. Education.    Cira Rue, PT, DPT 03/19/2023, 4:19 PM  [facility] [address] [place], [place] [address] P: [phone] I F: [phone]

## 2023-03-25 ENCOUNTER — Encounter: Payer: 59 | Admitting: Physical Therapy

## 2023-03-26 ENCOUNTER — Ambulatory Visit: Payer: 59 | Attending: Nurse Practitioner | Admitting: Physical Therapy

## 2023-03-26 ENCOUNTER — Encounter: Payer: Self-pay | Admitting: Physical Therapy

## 2023-03-26 DIAGNOSIS — M792 Neuralgia and neuritis, unspecified: Secondary | ICD-10-CM | POA: Insufficient documentation

## 2023-03-26 DIAGNOSIS — M542 Cervicalgia: Secondary | ICD-10-CM | POA: Insufficient documentation

## 2023-03-26 NOTE — Therapy (Signed)
OUTPATIENT PHYSICAL THERAPY DISCHARGE SUMMARY / TREATMENT Dates of reporting from 02/12/2023 to 03/26/2023   Patient Name: Rodney Benjamin MRN: [id number] DOB:[birth year], 62 y.o., male Today's Date: 03/26/2023  END OF SESSION:  PT End of Session - 03/26/23 1433     Visit Number 18    Number of Visits 25    Date for PT Re-Evaluation 04/01/23    Authorization Type [employer] reporting period from 02/12/2023    Authorization Time Period VL: 60 per cal yr    Authorization - Visit Number 18    Authorization - Number of Visits 60    Progress Note Due on Visit 20    PT Start Time 1432    PT Stop Time 1510    PT Time Calculation (min) 38 min    Activity Tolerance Patient tolerated treatment well    Behavior During Therapy WFL for tasks assessed/performed             Past Medical History:  Diagnosis Date   History of chicken pox    Hypercholesterolemia    Hypertension    Past Surgical History:  Procedure Laterality Date   INNER EAR SURGERY     cholesteatoma removed x 2   KNEE SURGERY  1982   right   TONSILLECTOMY     Patient Active Problem List   Diagnosis Date Noted   Facial pain 01/10/2023   Ear pain 11/24/2022   Neck pain 08/22/2022   Tick bite of right hip 06/26/2022   Colon cancer screening 06/22/2021   Leg skin lesion, left 12/25/2019   Hemorrhoid 12/09/2018   Headache 12/15/2017   GERD (gastroesophageal reflux disease) 04/25/2017   Tachycardia 04/23/2017   CKD (chronic kidney disease), stage III (HCC) 10/12/2016   Stress 01/26/2016   Loss of weight 01/31/2015   Cervical disc herniation 12/25/2014   Back pain 10/24/2014   Health care maintenance 10/24/2014   Fluttering heart 06/19/2014   Left arm numbness 06/19/2014   Renal cyst 06/12/2013   Hypercholesterolemia 06/06/2012   Hypertension 03/23/2012    PCP: Dale , MD   REFERRING PROVIDER: Loni Dolly, MP (neurosurgery)   REFERRING DIAG: cervical radiculopathy   THERAPY DIAG:   Cervicalgia  Neuralgia and neuritis    Rationale for Evaluation and Treatment: Rehabilitation   ONSET DATE: Approx February 2024   PERTINENT HISTORY:  Patient is a 62 y.o. male who presents to outpatient physical therapy with a referral for medical diagnosis cervical radiculopathy. This patient's chief complaints consist of right sided neck pain with radiation to right upper trap and radiation of paresthesia into the top of the right side of the head, leading to the following functional deficits: difficulty working (doesn't feel as sharp), decreased quality of life. Patient has Hypertension; Hypercholesterolemia; Renal cyst; Fluttering heart; Left arm numbness; Back pain; Health care maintenance; Cervical disc herniation; Loss of weight; Stress; CKD (chronic kidney disease), stage III (HCC); Tachycardia; GERD (gastroesophageal reflux disease); Headache; Hemorrhoid; Leg skin lesion, left; Tick bite of right hip; Neck pain; and Ear pain on their problem list. He  has a past medical history of History of Hypercholesterolemia, and Hypertension. He  has a past surgical history that includes Knee surgery (1982); Inner ear surgery; Tonsillectomy, C4-7 ACDF with Dr. Rise Mu 01/09/2015, surgery on the right ear 11/24/2022 for recurrent dermoid cyst abutting dehiscent sigmoid, meatoplasty performed and area around the sigmoid plate drilled down to exteriorize the cholesteatoma - matrix left over the sigmoid sinus due to risk of hemorrhage with removal.  Patient denies hx of cancer, stroke, seizures, lung problems, heart problems, diabetes, unexplained weight loss, unexplained changes in bowel or bladder problems, unexplained stumbling or dropping things, and osteoporosis.   SUBJECTIVE:                                                                                                                                                                                                          SUBJECTIVE STATEMENT: Patient  states his neck and head symptoms have been pretty stable since last PT session, but he can tell it is starting to get irritated. He is expecting to discharge from PT today for surgery scheduled on 03/30/2023.    PAIN:  NPRS: 2/10 radiating up behind head and at right ear to right eye area.    PRECAUTIONS: keep the packing in his ear.      PATIENT GOALS: "get rid of the pain"   NEXT MD VISIT: neck surgery 03/30/2023   OBJECTIVE  SELF-REPORTED FUNCTION FOTO score: 72/100 (neck questionnaire)  R Rotation/extension: reproduces concordant tightness and soreness along right side of neck.    TODAY'S TREATMENT:   Therapeutic exercise: to centralize symptoms and improve ROM, strength, muscular endurance, and activity tolerance required for successful completion of functional activities.  - Upper body ergometer level 7 to encourage joint nutrition, warm tissue, induce analgesic effect of aerobic exercise, improve muscular strength and endurance,  and prepare for remainder of session. 6:42 minutes changing directions every 1 minute.  - measurements to assess progress (see above).   Manual therapy: to reduce pain and tissue tension, improve range of motion, neuromodulation, in order to promote improved ability to complete functional activities. HOOKLYING - STM to right upper trap including sustained pressure  Modality: Dry needling performed to right upper trap to decrease pain and spasms along patient's right neck and head region with patient in supine utilizing 1 dry needle(s) .25mm x 40mm with 3 sticks at right upper trap. Patient educated about the risks and benefits from therapy and verbally consents to treatment. Dry needling performed by Luretha Murphy. Ilsa Iha PT, DPT who is certified in this technique.   Pt required multimodal cuing for proper technique and to facilitate improved neuromuscular control, strength, range of motion, and functional ability resulting in improved performance and  form.  PATIENT EDUCATION:  Education details: Exercise purpose/form. Self management techniques. Education on discharge recommendations.  Person educated: Patient Education method: Explanation, Demonstration, Tactile cues, Verbal cues, and Handouts Education comprehension: verbalized understanding, returned demonstration, tactile cues required, and needs further education   HOME EXERCISE PROGRAM:  Access Code: [email or web address] URL: [email or web address] Date: 01/26/2023 Prepared by: Norton Blizzard  Exercises - Seated Levator Scapulae Stretch  - 1-2 x daily - 3 sets - 30 seconds hold - Seated Upper Trapezius Stretch  - 1-2 x daily - 3 sets - 30 seconds hold - Shoulder External Rotation and Scapular Retraction with Resistance  - 1 x daily - 3 sets - 15 reps - Low Trap Setting at Wall  - 1 x daily - 3 sets - 10 reps - Quadruped Thoracic Rotation Full Range with Hand on Neck  - 3-5 x weekly - 2 sets - 10 reps - Quadruped Thoracic Rotation with Hand on Low Back  - 3-5 x weekly - 2 sets - 10 reps  Patient Education - Scientist, physiological - Ergonomics for Neck Discomfort - Introduction to Ergonomics  HOME EXERCISE PROGRAM [[id number]] View at "[email or web address]" using code: [id number] Suboccipital and Economist  -  Repeat 10 Repetitions, Hold 5 Seconds, Perform 2 Times a Day  HOME EXERCISE PROGRAM [[id number]] View at "[email or web address]" using code: [id number] Prayer Stretch -  Repeat 20 Repetitions, Hold 5 Seconds, Complete 2 Sets, Perform 1 Times a Day   ASSESSMENT:   CLINICAL IMPRESSION: Patient has attended 18 physical therapy sessions since starting current episode of care on 01/07/2023. He has made some progress towards some goals but ultimately was unable to resolve his symptoms sufficiently with PT and is discharging from PT today to undergo neck surgery on 03/30/2023. He got temporary relief from manual therapy and dry needling and has been continuing this for pain control  since surgery was scheduled. He is now discharged from PT.    OBJECTIVE IMPAIRMENTS: decreased activity tolerance, decreased knowledge of condition, decreased mobility, decreased ROM, hypomobility, increased muscle spasms, impaired flexibility, impaired tone, improper body mechanics, postural dysfunction, and pain.    ACTIVITY LIMITATIONS: working, quality of life   PARTICIPATION LIMITATIONS: occupation   PERSONAL FACTORS: Time since onset of injury/illness/exacerbation and 3+ comorbidities: Hypertension; Hypercholesterolemia; Renal cyst; Fluttering heart; Left arm numbness; Back pain; Health care maintenance; Cervical disc herniation; Loss of weight; Stress; CKD (chronic kidney disease), stage III (HCC); Tachycardia; GERD (gastroesophageal reflux disease); Headache; Hemorrhoid; Leg skin lesion, left; Tick bite of right hip; Neck pain; and Ear pain on their problem list. He  has a past medical history of History of Hypercholesterolemia, and Hypertension. He  has a past surgical history that includes Knee surgery (1982); Inner ear surgery; Tonsillectomy, C4-7 ACDF with Dr. Rise Mu 01/09/2015, surgery on the right ear 11/24/2022 for recurrent dermoid cyst abutting dehiscent sigmoid, meatoplasty performed and area around the sigmoid plate drilled down to exteriorize the cholesteatoma - matrix left over the sigmoid sinus due to risk of hemorrhage with removal are also affecting patient's functional outcome.    REHAB POTENTIAL: Good   CLINICAL DECISION MAKING: Evolving/moderate complexity   EVALUATION COMPLEXITY: Moderate     GOALS: Goals reviewed with patient? No  SHORT TERM GOALS: Target date: 01/21/2023   Patient will be independent with initial home exercise program for self-management of symptoms. Baseline: Initial HEP provided at IE (01/07/23); Goal status: MET     LONG TERM GOALS: Target date: 04/01/2023   Patient will be independent with a long-term home exercise program for  self-management of symptoms.  Baseline: Initial HEP provided at IE (01/07/23); participating 2-3x a day (02/12/2023); Participating well (03/26/2023);  Goal status: MET   2.  Patient will demonstrate improved FOTO to equal or greater than 75 by visit #10 to demonstrate  improvement in overall condition and self-reported functional ability.  Baseline: 70 (01/07/23); 72 at visit #10 (02/12/2023); 72 at visit #18 (03/26/2023);  Goal status: NOT MET   3.  Patient will demonstrate ability to complete end range right cervical spine rotation plus extension without increased symptoms to improve his ability to complete work activities.  Baseline: most strongly reproduces symptoms (01/07/23); still reproduces symptoms but not all the way to the top of his head (02/12/2023); reproduces concordant tightness and soreness along right side of neck (03/26/2023);  Goal status: Partially MET   4.  Patient will demonstrate cervical spine AROM rotation equal or greater than 60 degrees bilaterally to improve ability to check blind spot when driving.  Baseline: R/L 40/50 (01/07/23); R/L 65/61 (02/12/2023);  Goal status: MET   5.  Patient will complete community, work and/or recreational activities with 75% less limitation due to current condition.  Baseline: difficulty with work and quality of life (01/07/23); estimates 45-50% (02/12/2023);  60-70% (03/26/2023);  Goal status: Nearly MET       PLAN:   PT FREQUENCY: 1-2x/week   PT DURATION: 12 weeks   PLANNED INTERVENTIONS: Therapeutic exercises, Therapeutic activity, Neuromuscular re-education, Patient/Family education, Self Care, Joint mobilization, Dry Needling, Electrical stimulation, Spinal mobilization, Cryotherapy, Moist heat, Manual therapy, and Re-evaluation   PLAN FOR NEXT SESSION: patient is discharged from PT   Cira Rue, PT, DPT 03/26/2023, 4:14 PM  [facility] [address] [place], [place] [address] P:  [phone] I F: [phone]

## 2023-03-30 ENCOUNTER — Encounter: Payer: 59 | Admitting: Physical Therapy

## 2023-04-01 ENCOUNTER — Encounter: Payer: 59 | Admitting: Physical Therapy

## 2023-04-06 ENCOUNTER — Encounter: Payer: 59 | Admitting: Physical Therapy

## 2023-04-08 ENCOUNTER — Encounter: Payer: 59 | Admitting: Physical Therapy

## 2023-04-13 ENCOUNTER — Encounter: Payer: 59 | Admitting: Physical Therapy

## 2023-04-15 ENCOUNTER — Encounter: Payer: 59 | Admitting: Physical Therapy

## 2023-04-20 ENCOUNTER — Encounter: Payer: 59 | Admitting: Physical Therapy

## 2023-04-23 ENCOUNTER — Encounter: Payer: 59 | Admitting: Physical Therapy

## 2023-06-01 ENCOUNTER — Ambulatory Visit: Payer: 59 | Admitting: Internal Medicine

## 2023-06-01 ENCOUNTER — Encounter: Payer: Self-pay | Admitting: Internal Medicine

## 2023-06-01 VITALS — BP 122/74 | HR 73 | Temp 98.0°F | Resp 16 | Ht 73.0 in | Wt 224.8 lb

## 2023-06-01 DIAGNOSIS — Z8739 Personal history of other diseases of the musculoskeletal system and connective tissue: Secondary | ICD-10-CM | POA: Diagnosis not present

## 2023-06-01 DIAGNOSIS — Z125 Encounter for screening for malignant neoplasm of prostate: Secondary | ICD-10-CM | POA: Diagnosis not present

## 2023-06-01 DIAGNOSIS — Z1211 Encounter for screening for malignant neoplasm of colon: Secondary | ICD-10-CM

## 2023-06-01 DIAGNOSIS — N1831 Chronic kidney disease, stage 3a: Secondary | ICD-10-CM | POA: Diagnosis not present

## 2023-06-01 DIAGNOSIS — I10 Essential (primary) hypertension: Secondary | ICD-10-CM | POA: Diagnosis not present

## 2023-06-01 DIAGNOSIS — R519 Headache, unspecified: Secondary | ICD-10-CM

## 2023-06-01 DIAGNOSIS — E7800 Pure hypercholesterolemia, unspecified: Secondary | ICD-10-CM

## 2023-06-01 LAB — PSA: PSA: 2.1 ng/mL (ref 0.10–4.00)

## 2023-06-01 LAB — BASIC METABOLIC PANEL
BUN: 18 mg/dL (ref 6–23)
CO2: 27 meq/L (ref 19–32)
Calcium: 9.1 mg/dL (ref 8.4–10.5)
Chloride: 105 meq/L (ref 96–112)
Creatinine, Ser: 1.36 mg/dL (ref 0.40–1.50)
GFR: 55.58 mL/min — ABNORMAL LOW (ref >60.00)
Glucose, Bld: 94 mg/dL (ref 70–99)
Potassium: 4.1 meq/L (ref 3.5–5.1)
Sodium: 141 meq/L (ref 135–145)

## 2023-06-01 LAB — LIPID PANEL
Cholesterol: 156 mg/dL (ref 0–200)
HDL: 38 mg/dL — ABNORMAL LOW (ref >39.00)
LDL Cholesterol: 102 mg/dL — ABNORMAL HIGH (ref 0–99)
NonHDL: 117.67
Total CHOL/HDL Ratio: 4
Triglycerides: 78 mg/dL (ref 0.0–149.0)
VLDL: 15.6 mg/dL (ref 0.0–40.0)

## 2023-06-01 LAB — HEPATIC FUNCTION PANEL
ALT: 18 U/L (ref 0–53)
AST: 16 U/L (ref 0–37)
Albumin: 4.5 g/dL (ref 3.5–5.2)
Alkaline Phosphatase: 58 U/L (ref 39–117)
Bilirubin, Direct: 0.2 mg/dL (ref 0.0–0.3)
Total Bilirubin: 0.9 mg/dL (ref 0.2–1.2)
Total Protein: 7.1 g/dL (ref 6.0–8.3)

## 2023-06-01 LAB — TSH: TSH: 1.71 u[IU]/mL (ref 0.35–5.50)

## 2023-06-01 LAB — SEDIMENTATION RATE: Sed Rate: 6 mm/h (ref 0–20)

## 2023-06-01 LAB — VITAMIN B12: Vitamin B-12: 257 pg/mL (ref 211–911)

## 2023-06-01 NOTE — Assessment & Plan Note (Signed)
On amlodipine.   Continue amlodipine.  Follow pressures.  Follow metabolic panel.

## 2023-06-01 NOTE — Assessment & Plan Note (Signed)
Describes the burning sensation in his legs.  Not constant. Worse with sitting. Up walking - does not notice. Had a fall recently.  No residual injury.  Symptoms had started prior to fall. No head or neck injury. Recent neck surgery.  Has a call in to his surgeon. No abnormality noted on exam today.  Check routine labs, including cbc, metabolic panel, esr and B12. Discussed possible NCS.  Will notify me of response from [facility].

## 2023-06-01 NOTE — Assessment & Plan Note (Signed)
Colonoscopy 2022

## 2023-06-01 NOTE — Assessment & Plan Note (Signed)
Stay hydrated.  Avoid antiinflammatories.  Follow metabolic panel.

## 2023-06-01 NOTE — Assessment & Plan Note (Signed)
Facial symptoms resolved s/p surgery as outlined.  Follow.

## 2023-06-01 NOTE — Assessment & Plan Note (Signed)
Have discussed calculated cholesterol risk.  He has desired to hold on starting cholesterol medication.  Low cholesterol diet and exercise.  Follow lipid panel. Check lipid panel today.  

## 2023-06-01 NOTE — Progress Notes (Signed)
Subjective:    Patient ID: Rodney Benjamin, male    DOB: 06/25/1960, 62 y.o.   MRN: [id number]  Patient here for  Chief Complaint  Patient presents with   Medical Management of Chronic Issues    HPI Here for a scheduled follow up - f/u regarding hypertension. Is s/p anterior cervical diskectomy and fusion with plating at C3-4 with removal of previous plate at N8-2 on 03/30/23. Overall appears to be doing better since surgery. Left hand tingling - resolved. Facial sensation - better. Also, has a history of right cholesteatoma s/p modified radical mastoidectomy with meatoplasty in 2019 and revision tympanomastoidectomy in 2020 with TORP and cartilage graft. Is s/p surgery on the right ear 11/2022 for recurrent dermoid cyst. Overall doing well.  He has been walking regularly. No chest pain or sob reported. No abdominal pain or bowel change reported. He does report over the last 3-4 weeks, he has noticed increased burning sensation - legs.  Area of involvement varies - may be thighs, around knees or lower legs.  Does not appear to involve the feet. May noticed some burning around the ankles. No rash. No weakness. Worse when sitting. Up walking - better.    Past Medical History:  Diagnosis Date   History of chicken pox    Hypercholesterolemia    Hypertension    Past Surgical History:  Procedure Laterality Date   INNER EAR SURGERY     cholesteatoma removed x 2   KNEE SURGERY  1982   right   TONSILLECTOMY     Family History  Problem Relation Age of Onset   Prostate cancer Other    Hypertension Other    Breast cancer Other    Social History   Socioeconomic History   Marital status: Married    Spouse name: Not on file   Number of children: Not on file   Years of education: Not on file   Highest education level: Bachelor's degree (e.g., BA, AB, BS)  Occupational History   Not on file  Tobacco Use   Smoking status: Never   Smokeless tobacco: Never  Substance and Sexual  Activity   Alcohol use: No    Alcohol/week: 0.0 standard drinks of alcohol   Drug use: No   Sexual activity: Not on file  Other Topics Concern   Not on file  Social History Narrative   Not on file   Social Determinants of Health   Financial Resource Strain: Low Risk  (05/28/2023)   Overall Financial Resource Strain (CARDIA)    Difficulty of Paying Living Expenses: Not hard at all  Food Insecurity: No Food Insecurity (05/28/2023)   Hunger Vital Sign    Worried About Running Out of Food in the Last Year: Never true    Ran Out of Food in the Last Year: Never true  Transportation Needs: No Transportation Needs (05/28/2023)   PRAPARE - Administrator, Civil Service (Medical): No    Lack of Transportation (Non-Medical): No  Physical Activity: Sufficiently Active (05/28/2023)   Exercise Vital Sign    Days of Exercise per Week: 6 days    Minutes of Exercise per Session: 60 min  Stress: No Stress Concern Present (05/28/2023)   [employer] of Occupational Health - Occupational Stress Questionnaire    Feeling of Stress : Not at all  Social Connections: Socially Integrated (05/28/2023)   Social Connection and Isolation Panel [NHANES]    Frequency of Communication with Friends and Family: More than three  times a week    Frequency of Social Gatherings with Friends and Family: Once a week    Attends Religious Services: More than 4 times per year    Active Member of [employer] or Organizations: Yes    Attends Banker Meetings: 1 to 4 times per year    Marital Status: Married     Review of Systems  Constitutional:  Negative for appetite change and unexpected weight change.  HENT:  Negative for congestion and sinus pressure.   Respiratory:  Negative for cough, chest tightness and shortness of breath.   Cardiovascular:  Negative for chest pain, palpitations and leg swelling.  Gastrointestinal:  Negative for abdominal pain, diarrhea, nausea and vomiting.  Genitourinary:   Negative for difficulty urinating and dysuria.  Musculoskeletal:  Negative for joint swelling and myalgias.       Burning sensation (intermittent) - legs.   Skin:  Negative for color change and rash.  Neurological:  Negative for dizziness and headaches.  Psychiatric/Behavioral:  Negative for agitation and dysphoric mood.        Objective:     BP 122/74   Pulse 73   Temp 98 F (36.7 C)   Resp 16   Ht 6\' 1"  (1.854 m)   Wt 224 lb 12.8 oz (102 kg)   SpO2 98%   BMI 29.66 kg/m  Wt Readings from Last 3 Encounters:  06/01/23 224 lb 12.8 oz (102 kg)  01/28/23 221 lb (100.2 kg)  01/07/23 231 lb 9.6 oz (105.1 kg)    Physical Exam Constitutional:      General: He is not in acute distress.    Appearance: Normal appearance. He is well-developed.  HENT:     Head: Normocephalic and atraumatic.     Right Ear: External ear normal.     Left Ear: External ear normal.     Mouth/Throat:     Pharynx: No oropharyngeal exudate or posterior oropharyngeal erythema.  Eyes:     General: No scleral icterus.       Right eye: No discharge.        Left eye: No discharge.  Cardiovascular:     Rate and Rhythm: Normal rate and regular rhythm.  Pulmonary:     Effort: Pulmonary effort is normal. No respiratory distress.     Breath sounds: Normal breath sounds.  Abdominal:     General: Bowel sounds are normal.     Palpations: Abdomen is soft.     Tenderness: There is no abdominal tenderness.  Musculoskeletal:        General: No swelling or tenderness.     Cervical back: Neck supple. No tenderness.  Lymphadenopathy:     Cervical: No cervical adenopathy.  Skin:    Findings: No erythema or rash.  Neurological:     Mental Status: He is alert.     Comments: Sensation to pin prick - normal. Motor strength - normal - bilateral lower extremities.   Psychiatric:        Mood and Affect: Mood normal.        Behavior: Behavior normal.      Outpatient Encounter Medications as of 06/01/2023   Medication Sig   amLODipine (NORVASC) 5 MG tablet Take 1 tablet (5 mg total) by mouth daily.   metroNIDAZOLE (METROGEL) 1 % gel Apply topically daily.   [DISCONTINUED] doxycycline (MONODOX) 50 MG capsule Take 50 mg by mouth 2 (two) times daily.   No facility-administered encounter medications on file as of 06/01/2023.  Lab Results  Component Value Date   WBC 6.2 12/29/2022   HGB 14.6 12/29/2022   HCT 45 12/29/2022   PLT 157 12/29/2022   GLUCOSE 88 01/28/2023   CHOL 139 01/28/2023   TRIG 78.0 01/28/2023   HDL 38.60 (L) 01/28/2023   LDLCALC 84 01/28/2023   ALT 21 01/28/2023   AST 21 01/28/2023   NA 139 01/28/2023   K 4.1 01/28/2023   CL 104 01/28/2023   CREATININE 1.43 01/28/2023   BUN 19 01/28/2023   CO2 28 01/28/2023   TSH 1.31 07/18/2022   PSA 2.23 01/28/2023    CT CERVICAL SPINE WO CONTRAST  Result Date: 02/17/2023 CLINICAL DATA:  Severe right-sided neck pain radiating up into the head for 6 months. Left arm tingling. Previous cervical fusion. EXAM: CT CERVICAL SPINE WITHOUT CONTRAST TECHNIQUE: Multidetector CT imaging of the cervical spine was performed without intravenous contrast. Multiplanar CT image reconstructions were also generated. RADIATION DOSE REDUCTION: This exam was performed according to the departmental dose-optimization program which includes automated exposure control, adjustment of the mA and/or kV according to patient size and/or use of iterative reconstruction technique. COMPARISON:  Radiographs 08/22/2022.  MRI 02/06/2023. FINDINGS: Alignment: Stable straightening. No focal angulation or significant listhesis. Skull base and vertebrae: No evidence of acute cervical spine fracture or traumatic subluxation. Status post C4-7 ACDF with an anterior plate, screws and intervertebral bone plugs. The hardware is intact. Interbody fusion appears solid. Soft tissues and spinal canal: No prevertebral fluid or swelling. No visible canal hematoma. Disc levels: The disc  space findings are unchanged from the recent MRI and better demonstrated on that study. At C3-4, there is spur covering a right paracentral disc protrusion and associated mild cord flattening and mild left foraminal narrowing. There are residual small posterior osteophytes at C5-6 and C6-7. At C7-T1, there is bilateral facet hypertrophy contributing to mild right-sided foraminal narrowing. Upper chest: Unremarkable. Other: Subcutaneous nodularity in the occipital scalp and upper neck posteriorly with low signal on recent MRI, likely sebaceous cysts. IMPRESSION: 1. No evidence of acute cervical spine fracture, traumatic subluxation or static signs of instability. 2. Solid interbody fusion status post C4-7 ACDF. 3. Right paracentral disc protrusion at C3-4 with mild cord flattening and mild left foraminal narrowing. Please see recent MRI report. 4. Mild right-sided foraminal narrowing at C7-T1. Electronically Signed   By: Carey Bullocks M.D.   On: 02/17/2023 10:32       Assessment & Plan:  Primary hypertension Assessment & Plan: On amlodipine.   Continue amlodipine.  Follow pressures.  Follow metabolic panel.   Orders: -     Basic metabolic panel  Hypercholesterolemia Assessment & Plan: Have discussed calculated cholesterol risk.  He has desired to hold on starting cholesterol medication.  Low cholesterol diet and exercise.  Follow lipid panel. Check lipid panel today.   Orders: -     TSH -     Lipid panel -     Hepatic function panel  Stage 3a chronic kidney disease (HCC) Assessment & Plan: Stay hydrated. Avoid antiinflammatories.  Follow metabolic panel.    Prostate cancer screening -     PSA  History of burning pain in leg Assessment & Plan: Describes the burning sensation in his legs.  Not constant. Worse with sitting. Up walking - does not notice. Had a fall recently.  No residual injury.  Symptoms had started prior to fall. No head or neck injury. Recent neck surgery.  Has a call  in to his  Careers adviser. No abnormality noted on exam today.  Check routine labs, including cbc, metabolic panel, esr and B12. Discussed possible NCS.  Will notify me of response from [facility].   Orders: -     Vitamin B12 -     Sedimentation rate  Facial pain Assessment & Plan: Facial symptoms resolved s/p surgery as outlined.  Follow.    Colon cancer screening Assessment & Plan: Colonoscopy 2022.       Dale [place]leside, MD

## 2023-06-03 ENCOUNTER — Encounter: Payer: Self-pay | Admitting: Internal Medicine

## 2023-06-03 NOTE — Telephone Encounter (Signed)
See result note.  Thank him for the update.  Tell him to keep [place] posted and let me know if persistent problems.

## 2023-06-04 ENCOUNTER — Encounter: Payer: Self-pay | Admitting: Internal Medicine

## 2023-06-12 ENCOUNTER — Other Ambulatory Visit: Payer: Self-pay | Admitting: Internal Medicine

## 2023-10-01 ENCOUNTER — Encounter: Payer: Self-pay | Admitting: Internal Medicine

## 2023-10-01 ENCOUNTER — Ambulatory Visit (INDEPENDENT_AMBULATORY_CARE_PROVIDER_SITE_OTHER): Payer: 59 | Admitting: Internal Medicine

## 2023-10-01 VITALS — BP 126/68 | HR 76 | Temp 98.2°F | Resp 16 | Ht 73.0 in | Wt 226.2 lb

## 2023-10-01 DIAGNOSIS — Z1211 Encounter for screening for malignant neoplasm of colon: Secondary | ICD-10-CM | POA: Diagnosis not present

## 2023-10-01 DIAGNOSIS — N1831 Chronic kidney disease, stage 3a: Secondary | ICD-10-CM | POA: Diagnosis not present

## 2023-10-01 DIAGNOSIS — E7800 Pure hypercholesterolemia, unspecified: Secondary | ICD-10-CM

## 2023-10-01 DIAGNOSIS — R519 Headache, unspecified: Secondary | ICD-10-CM

## 2023-10-01 DIAGNOSIS — I10 Essential (primary) hypertension: Secondary | ICD-10-CM

## 2023-10-01 LAB — LIPID PANEL
Cholesterol: 149 mg/dL (ref 0–200)
HDL: 36.6 mg/dL — ABNORMAL LOW (ref >39.00)
LDL Cholesterol: 95 mg/dL (ref 0–99)
NonHDL: 112.11
Total CHOL/HDL Ratio: 4
Triglycerides: 87 mg/dL (ref 0.0–149.0)
VLDL: 17.4 mg/dL (ref 0.0–40.0)

## 2023-10-01 LAB — HEPATIC FUNCTION PANEL
ALT: 23 U/L (ref 0–53)
AST: 19 U/L (ref 0–37)
Albumin: 4.4 g/dL (ref 3.5–5.2)
Alkaline Phosphatase: 51 U/L (ref 39–117)
Bilirubin, Direct: 0.2 mg/dL (ref 0.0–0.3)
Total Bilirubin: 1 mg/dL (ref 0.2–1.2)
Total Protein: 7.3 g/dL (ref 6.0–8.3)

## 2023-10-01 LAB — BASIC METABOLIC PANEL WITH GFR
BUN: 19 mg/dL (ref 6–23)
CO2: 29 meq/L (ref 19–32)
Calcium: 9.1 mg/dL (ref 8.4–10.5)
Chloride: 103 meq/L (ref 96–112)
Creatinine, Ser: 1.42 mg/dL (ref 0.40–1.50)
GFR: 52.65 mL/min — ABNORMAL LOW (ref >60.00)
Glucose, Bld: 95 mg/dL (ref 70–99)
Potassium: 4.2 meq/L (ref 3.5–5.1)
Sodium: 139 meq/L (ref 135–145)

## 2023-10-01 NOTE — Assessment & Plan Note (Signed)
 Have previously discussed calculated cholesterol risk.  He has desired to hold on starting cholesterol medication.  Low cholesterol diet and exercise.  Follow lipid panel. Check lipid panel today.

## 2023-10-01 NOTE — Progress Notes (Signed)
 Subjective:    Patient ID: Rodney Benjamin, male    DOB: 02/20/1961, 63 y.o.   MRN: [id number]  Patient here for  Chief Complaint  Patient presents with   Medical Management of Chronic Issues    HPI Here for a scheduled follow up -  f/u regarding hypertension. Is s/p anterior cervical diskectomy and fusion with plating at C3-4 with removal of previous plate at N8-2 on 03/30/23. Also, has a history of right cholesteatoma s/p modified radical mastoidectomy with meatoplasty in 2019 and revision tympanomastoidectomy in 2020 with TORP and cartilage graft. Is s/p surgery on the right ear 11/2022 for recurrent dermoid cyst. Overall doing well. Had f/u with surgery 07/03/23 - stable. The previous - burning/tingling sensation - legs/feet - overall improved. He stays active. No chest pain or sob reported. No increased cough or congestion. Planning to retire in July.    Past Medical History:  Diagnosis Date   History of chicken pox    Hypercholesterolemia    Hypertension    Past Surgical History:  Procedure Laterality Date   INNER EAR SURGERY     cholesteatoma removed x 2   KNEE SURGERY  1982   right   TONSILLECTOMY     Family History  Problem Relation Age of Onset   Prostate cancer Other    Hypertension Other    Breast cancer Other    Social History   Socioeconomic History   Marital status: Married    Spouse name: Not on file   Number of children: Not on file   Years of education: Not on file   Highest education level: Bachelor's degree (e.g., BA, AB, BS)  Occupational History   Not on file  Tobacco Use   Smoking status: Never   Smokeless tobacco: Never  Substance and Sexual Activity   Alcohol use: No    Alcohol/week: 0.0 standard drinks of alcohol   Drug use: No   Sexual activity: Not on file  Other Topics Concern   Not on file  Social History Narrative   Not on file   Social Drivers of Health   Financial Resource Strain: Low Risk  (09/28/2023)   Overall  Financial Resource Strain (CARDIA)    Difficulty of Paying Living Expenses: Not hard at all  Food Insecurity: No Food Insecurity (09/28/2023)   Hunger Vital Sign    Worried About Running Out of Food in the Last Year: Never true    Ran Out of Food in the Last Year: Never true  Transportation Needs: No Transportation Needs (09/28/2023)   PRAPARE - Administrator, Civil Service (Medical): No    Lack of Transportation (Non-Medical): No  Physical Activity: Sufficiently Active (09/28/2023)   Exercise Vital Sign    Days of Exercise per Week: 4 days    Minutes of Exercise per Session: 40 min  Stress: No Stress Concern Present (09/28/2023)   [employer] of Occupational Health - Occupational Stress Questionnaire    Feeling of Stress : Not at all  Social Connections: Socially Integrated (09/28/2023)   Social Connection and Isolation Panel [NHANES]    Frequency of Communication with Friends and Family: More than three times a week    Frequency of Social Gatherings with Friends and Family: Once a week    Attends Religious Services: More than 4 times per year    Active Member of [employer] or Organizations: Yes    Attends Banker Meetings: 1 to 4 times per year  Marital Status: Married     Review of Systems  Constitutional:  Negative for appetite change and unexpected weight change.  HENT:  Negative for congestion and sinus pressure.   Respiratory:  Negative for cough, chest tightness and shortness of breath.   Cardiovascular:  Negative for chest pain, palpitations and leg swelling.  Gastrointestinal:  Negative for abdominal pain, diarrhea, nausea and vomiting.  Genitourinary:  Negative for difficulty urinating and dysuria.  Musculoskeletal:  Negative for joint swelling and myalgias.  Skin:  Negative for color change and rash.  Neurological:  Negative for dizziness and headaches.  Psychiatric/Behavioral:  Negative for agitation and dysphoric mood.        Objective:      BP 126/68   Pulse 76   Temp 98.2 F (36.8 C)   Resp 16   Ht 6\' 1"  (1.854 m)   Wt 226 lb 3.2 oz (102.6 kg)   SpO2 98%   BMI 29.84 kg/m  Wt Readings from Last 3 Encounters:  10/01/23 226 lb 3.2 oz (102.6 kg)  06/01/23 224 lb 12.8 oz (102 kg)  01/28/23 221 lb (100.2 kg)    Physical Exam Vitals reviewed.  Constitutional:      General: He is not in acute distress.    Appearance: Normal appearance. He is well-developed.  HENT:     Head: Normocephalic and atraumatic.     Right Ear: External ear normal.     Left Ear: External ear normal.     Mouth/Throat:     Pharynx: No oropharyngeal exudate or posterior oropharyngeal erythema.  Eyes:     General: No scleral icterus.       Right eye: No discharge.        Left eye: No discharge.     Conjunctiva/sclera: Conjunctivae normal.  Cardiovascular:     Rate and Rhythm: Normal rate and regular rhythm.  Pulmonary:     Effort: Pulmonary effort is normal. No respiratory distress.     Breath sounds: Normal breath sounds.  Abdominal:     General: Bowel sounds are normal.     Palpations: Abdomen is soft.     Tenderness: There is no abdominal tenderness.  Musculoskeletal:        General: No swelling or tenderness.     Cervical back: Neck supple. No tenderness.  Lymphadenopathy:     Cervical: No cervical adenopathy.  Skin:    Findings: No erythema or rash.  Neurological:     Mental Status: He is alert.  Psychiatric:        Mood and Affect: Mood normal.        Behavior: Behavior normal.         Outpatient Encounter Medications as of 10/01/2023  Medication Sig   amLODipine (NORVASC) 5 MG tablet TAKE ONE TABLET EVERY DAY   metroNIDAZOLE (METROGEL) 1 % gel Apply topically daily.   No facility-administered encounter medications on file as of 10/01/2023.     Lab Results  Component Value Date   WBC 6.2 12/29/2022   HGB 14.6 12/29/2022   HCT 45 12/29/2022   PLT 157 12/29/2022   GLUCOSE 95 10/01/2023   CHOL 149 10/01/2023    TRIG 87.0 10/01/2023   HDL 36.60 (L) 10/01/2023   LDLCALC 95 10/01/2023   ALT 23 10/01/2023   AST 19 10/01/2023   NA 139 10/01/2023   K 4.2 10/01/2023   CL 103 10/01/2023   CREATININE 1.42 10/01/2023   BUN 19 10/01/2023   CO2 29 10/01/2023  TSH 1.71 06/01/2023   PSA 2.10 06/01/2023    CT CERVICAL SPINE WO CONTRAST Result Date: 02/17/2023 CLINICAL DATA:  Severe right-sided neck pain radiating up into the head for 6 months. Left arm tingling. Previous cervical fusion. EXAM: CT CERVICAL SPINE WITHOUT CONTRAST TECHNIQUE: Multidetector CT imaging of the cervical spine was performed without intravenous contrast. Multiplanar CT image reconstructions were also generated. RADIATION DOSE REDUCTION: This exam was performed according to the departmental dose-optimization program which includes automated exposure control, adjustment of the mA and/or kV according to patient size and/or use of iterative reconstruction technique. COMPARISON:  Radiographs 08/22/2022.  MRI 02/06/2023. FINDINGS: Alignment: Stable straightening. No focal angulation or significant listhesis. Skull base and vertebrae: No evidence of acute cervical spine fracture or traumatic subluxation. Status post C4-7 ACDF with an anterior plate, screws and intervertebral bone plugs. The hardware is intact. Interbody fusion appears solid. Soft tissues and spinal canal: No prevertebral fluid or swelling. No visible canal hematoma. Disc levels: The disc space findings are unchanged from the recent MRI and better demonstrated on that study. At C3-4, there is spur covering a right paracentral disc protrusion and associated mild cord flattening and mild left foraminal narrowing. There are residual small posterior osteophytes at C5-6 and C6-7. At C7-T1, there is bilateral facet hypertrophy contributing to mild right-sided foraminal narrowing. Upper chest: Unremarkable. Other: Subcutaneous nodularity in the occipital scalp and upper neck posteriorly with low  signal on recent MRI, likely sebaceous cysts. IMPRESSION: 1. No evidence of acute cervical spine fracture, traumatic subluxation or static signs of instability. 2. Solid interbody fusion status post C4-7 ACDF. 3. Right paracentral disc protrusion at C3-4 with mild cord flattening and mild left foraminal narrowing. Please see recent MRI report. 4. Mild right-sided foraminal narrowing at C7-T1. Electronically Signed   By: Elmon Hagedorn M.D.   On: 02/17/2023 10:32       Assessment & Plan:  Stage 3a chronic kidney disease (HCC) Assessment & Plan: Stay hydrated. Continue to avoid antiinflammatory medication. Follow metabolic panel.    Primary hypertension Assessment & Plan: On amlodipine. Blood pressure as outlined. Follow pressures. Follow metabolic panel.   Orders: -     Basic metabolic panel with GFR  Hypercholesterolemia Assessment & Plan: Have previously discussed calculated cholesterol risk.  He has desired to hold on starting cholesterol medication.  Low cholesterol diet and exercise.  Follow lipid panel. Check lipid panel today.   Orders: -     Hepatic function panel -     Lipid panel  Colon cancer screening Assessment & Plan: Colonoscopy 2022.    Facial pain Assessment & Plan: Improved. Follow.       Dellar Fenton, MD

## 2023-10-04 ENCOUNTER — Encounter: Payer: Self-pay | Admitting: Internal Medicine

## 2023-10-04 NOTE — Assessment & Plan Note (Signed)
 Colonoscopy 2022.

## 2023-10-04 NOTE — Assessment & Plan Note (Signed)
Improved.  Follow.  

## 2023-10-04 NOTE — Assessment & Plan Note (Signed)
 Stay hydrated. Continue to avoid antiinflammatory medication. Follow metabolic panel.

## 2023-10-04 NOTE — Assessment & Plan Note (Signed)
On amlodipine.  Blood pressure as outlined.  Follow pressures.  Follow metabolic panel.  

## 2024-02-02 ENCOUNTER — Encounter: Payer: Self-pay | Admitting: Internal Medicine

## 2024-02-02 ENCOUNTER — Ambulatory Visit: Admitting: Internal Medicine

## 2024-02-02 VITALS — BP 128/78 | HR 75 | Resp 16 | Ht 73.0 in | Wt 222.0 lb

## 2024-02-02 DIAGNOSIS — R202 Paresthesia of skin: Secondary | ICD-10-CM

## 2024-02-02 DIAGNOSIS — R200 Anesthesia of skin: Secondary | ICD-10-CM | POA: Insufficient documentation

## 2024-02-02 DIAGNOSIS — E7800 Pure hypercholesterolemia, unspecified: Secondary | ICD-10-CM | POA: Diagnosis not present

## 2024-02-02 DIAGNOSIS — I10 Essential (primary) hypertension: Secondary | ICD-10-CM

## 2024-02-02 DIAGNOSIS — Z0000 Encounter for general adult medical examination without abnormal findings: Secondary | ICD-10-CM | POA: Diagnosis not present

## 2024-02-02 DIAGNOSIS — N1831 Chronic kidney disease, stage 3a: Secondary | ICD-10-CM

## 2024-02-02 DIAGNOSIS — M5020 Other cervical disc displacement, unspecified cervical region: Secondary | ICD-10-CM

## 2024-02-02 LAB — CBC WITH DIFFERENTIAL/PLATELET
Basophils Absolute: 0 K/uL (ref 0.0–0.1)
Basophils Relative: 0.8 % (ref 0.0–3.0)
Eosinophils Absolute: 0.1 K/uL (ref 0.0–0.7)
Eosinophils Relative: 1.3 % (ref 0.0–5.0)
HCT: 41.9 % (ref 39.0–52.0)
Hemoglobin: 14 g/dL (ref 13.0–17.0)
Lymphocytes Relative: 26.2 % (ref 12.0–46.0)
Lymphs Abs: 1.2 K/uL (ref 0.7–4.0)
MCHC: 33.5 g/dL (ref 30.0–36.0)
MCV: 92.7 fl (ref 78.0–100.0)
Monocytes Absolute: 0.6 K/uL (ref 0.1–1.0)
Monocytes Relative: 12.3 % — ABNORMAL HIGH (ref 3.0–12.0)
Neutro Abs: 2.7 K/uL (ref 1.4–7.7)
Neutrophils Relative %: 59.4 % (ref 43.0–77.0)
Platelets: 154 K/uL (ref 150.0–400.0)
RBC: 4.52 Mil/uL (ref 4.22–5.81)
RDW: 12.7 % (ref 11.5–15.5)
WBC: 4.6 K/uL (ref 4.0–10.5)

## 2024-02-02 LAB — LIPID PANEL
Cholesterol: 127 mg/dL (ref 0–200)
HDL: 36.9 mg/dL — ABNORMAL LOW (ref >39.00)
LDL Cholesterol: 78 mg/dL (ref 0–99)
NonHDL: 89.87
Total CHOL/HDL Ratio: 3
Triglycerides: 60 mg/dL (ref 0.0–149.0)
VLDL: 12 mg/dL (ref 0.0–40.0)

## 2024-02-02 LAB — BASIC METABOLIC PANEL WITH GFR
BUN: 16 mg/dL (ref 6–23)
CO2: 29 meq/L (ref 19–32)
Calcium: 8.8 mg/dL (ref 8.4–10.5)
Chloride: 105 meq/L (ref 96–112)
Creatinine, Ser: 1.23 mg/dL (ref 0.40–1.50)
GFR: 62.41 mL/min (ref >60.00)
Glucose, Bld: 90 mg/dL (ref 70–99)
Potassium: 4 meq/L (ref 3.5–5.1)
Sodium: 139 meq/L (ref 135–145)

## 2024-02-02 LAB — HEPATIC FUNCTION PANEL
ALT: 25 U/L (ref 0–53)
AST: 19 U/L (ref 0–37)
Albumin: 4.3 g/dL (ref 3.5–5.2)
Alkaline Phosphatase: 52 U/L (ref 39–117)
Bilirubin, Direct: 0.1 mg/dL (ref 0.0–0.3)
Total Bilirubin: 0.8 mg/dL (ref 0.2–1.2)
Total Protein: 6.8 g/dL (ref 6.0–8.3)

## 2024-02-02 LAB — TSH: TSH: 1.39 u[IU]/mL (ref 0.35–5.50)

## 2024-02-02 LAB — VITAMIN B12: Vitamin B-12: 933 pg/mL — ABNORMAL HIGH (ref 211–911)

## 2024-02-02 NOTE — Assessment & Plan Note (Signed)
 Has noticed a persistent pinprick sensation in his feet and lower extremities. Discussed further w/up. Check labs, including B12. Check NCS.

## 2024-02-02 NOTE — Assessment & Plan Note (Signed)
 Has discussed with [facility]. May be related to his cervical issues and s/p surgery. No weakness. Will check NCS of upper extremities as well.

## 2024-02-02 NOTE — Assessment & Plan Note (Signed)
 Stay hydrated. Continue to avoid antiinflammatory medication. Check metabolic panel today.

## 2024-02-02 NOTE — Assessment & Plan Note (Signed)
 Have previously discussed calculated cholesterol risk.  He has desired to hold on starting cholesterol medication.  Low cholesterol diet and exercise.  Follow lipid panel. Check lipid panel today.

## 2024-02-02 NOTE — Progress Notes (Signed)
 Subjective:    Patient ID: [name], male    DOB: [birth year], 63 y.o.   MRN: [id number]  Patient here for  Chief Complaint  Patient presents with   Annual Exam    HPI Here for a physical exam. S/p revision mastoidectomy with TORP and cartilage graft 11/24/22 for recurrent cholesteatoma cyst. Had f/u 12/15/23 - stable. Debris removed. Overall doing well. Retired. Staying physically active. No chest pain or sob with increased activity or exertion. No abdominal pain or bowel change reported. Does report - s/p neck surgery 03/2023. Noticed burning sensation in his legs (when sitting) - starting around 05/2023. Now feels like pin pricks lower extremities (feet and lower extremities). No back pain. Also reports tightness in his hands and lower arms. No weakness.    Past Medical History:  Diagnosis Date   History of chicken pox    Hypercholesterolemia    Hypertension    Past Surgical History:  Procedure Laterality Date   INNER EAR SURGERY     cholesteatoma removed x 2   KNEE SURGERY  1982   right   TONSILLECTOMY     Family History  Problem Relation Age of Onset   Prostate cancer Other    Hypertension Other    Breast cancer Other    Social History   Socioeconomic History   Marital status: Married    Spouse name: Not on file   Number of children: Not on file   Years of education: Not on file   Highest education level: Bachelor's degree (e.g., BA, AB, BS)  Occupational History   Not on file  Tobacco Use   Smoking status: Never   Smokeless tobacco: Never  Substance and Sexual Activity   Alcohol use: No    Alcohol/week: 0.0 standard drinks of alcohol   Drug use: No   Sexual activity: Not on file  Other Topics Concern   Not on file  Social History Narrative   Not on file   Social Drivers of Health   Financial Resource Strain: Low Risk  (02/01/2024)   Overall Financial Resource Strain (CARDIA)    Difficulty of Paying Living Expenses: Not hard at all  Food  Insecurity: No Food Insecurity (02/01/2024)   Hunger Vital Sign    Worried About Running Out of Food in the Last Year: Never true    Ran Out of Food in the Last Year: Never true  Transportation Needs: No Transportation Needs (02/01/2024)   PRAPARE - Administrator, Civil Service (Medical): No    Lack of Transportation (Non-Medical): No  Physical Activity: Sufficiently Active (02/01/2024)   Exercise Vital Sign    Days of Exercise per Week: 5 days    Minutes of Exercise per Session: 30 min  Stress: No Stress Concern Present (02/01/2024)   [employer] of Occupational Health - Occupational Stress Questionnaire    Feeling of Stress: Not at all  Social Connections: Moderately Integrated (02/01/2024)   Social Connection and Isolation Panel    Frequency of Communication with Friends and Family: Once a week    Frequency of Social Gatherings with Friends and Family: Once a week    Attends Religious Services: More than 4 times per year    Active Member of [employer] or Organizations: Yes    Attends Engineer, structural: More than 4 times per year    Marital Status: Married     Review of Systems  Constitutional:  Negative for appetite change and unexpected weight change.  HENT:  Negative for congestion, sinus pressure and sore throat.   Eyes:  Negative for pain and visual disturbance.  Respiratory:  Negative for cough, chest tightness and shortness of breath.   Cardiovascular:  Negative for chest pain, palpitations and leg swelling.  Gastrointestinal:  Negative for abdominal pain, diarrhea, nausea and vomiting.  Genitourinary:  Negative for difficulty urinating and dysuria.  Musculoskeletal:  Negative for joint swelling and myalgias.  Skin:  Negative for color change and rash.  Neurological:  Negative for dizziness and headaches.       Pin prick sensation as outlined - lower extremities.   Hematological:  Negative for adenopathy. Does not bruise/bleed easily.   Psychiatric/Behavioral:  Negative for agitation and dysphoric mood.        Objective:     BP 128/78   Pulse 75   Resp 16   Ht 6' 1 (1.854 m)   Wt 222 lb (100.7 kg)   SpO2 98%   BMI 29.29 kg/m  Wt Readings from Last 3 Encounters:  02/02/24 222 lb (100.7 kg)  10/01/23 226 lb 3.2 oz (102.6 kg)  06/01/23 224 lb 12.8 oz (102 kg)    Physical Exam Constitutional:      General: He is not in acute distress.    Appearance: Normal appearance. He is well-developed.  HENT:     Head: Normocephalic and atraumatic.     Right Ear: External ear normal.     Left Ear: External ear normal.     Mouth/Throat:     Pharynx: No oropharyngeal exudate or posterior oropharyngeal erythema.  Eyes:     General: No scleral icterus.       Right eye: No discharge.        Left eye: No discharge.     Conjunctiva/sclera: Conjunctivae normal.  Neck:     Thyroid : No thyromegaly.  Cardiovascular:     Rate and Rhythm: Normal rate and regular rhythm.  Pulmonary:     Effort: No respiratory distress.     Breath sounds: Normal breath sounds. No wheezing.  Abdominal:     General: Bowel sounds are normal.     Palpations: Abdomen is soft.     Tenderness: There is no abdominal tenderness.  Musculoskeletal:        General: No swelling or tenderness.     Cervical back: Neck supple. No tenderness.     Comments: No motor weakness noted on exam.   Lymphadenopathy:     Cervical: No cervical adenopathy.  Skin:    Findings: No erythema or rash.  Neurological:     Mental Status: He is alert and oriented to person, place, and time.     Comments: Sensation intact to pin prick and light touch.   Psychiatric:        Mood and Affect: Mood normal.        Behavior: Behavior normal.         Outpatient Encounter Medications as of 02/02/2024  Medication Sig   amLODipine  (NORVASC ) 5 MG tablet TAKE ONE TABLET EVERY DAY   metroNIDAZOLE  (METROGEL ) 1 % gel Apply topically daily.   No facility-administered encounter  medications on file as of 02/02/2024.     Lab Results  Component Value Date   WBC 6.2 12/29/2022   HGB 14.6 12/29/2022   HCT 45 12/29/2022   PLT 157 12/29/2022   GLUCOSE 95 10/01/2023   CHOL 149 10/01/2023   TRIG 87.0 10/01/2023   HDL 36.60 (L) 10/01/2023   LDLCALC 95  10/01/2023   ALT 23 10/01/2023   AST 19 10/01/2023   NA 139 10/01/2023   K 4.2 10/01/2023   CL 103 10/01/2023   CREATININE 1.42 10/01/2023   BUN 19 10/01/2023   CO2 29 10/01/2023   TSH 1.71 06/01/2023   PSA 2.10 06/01/2023    CT CERVICAL SPINE WO CONTRAST Result Date: 02/17/2023 CLINICAL DATA:  Severe right-sided neck pain radiating up into the head for 6 months. Left arm tingling. Previous cervical fusion. EXAM: CT CERVICAL SPINE WITHOUT CONTRAST TECHNIQUE: Multidetector CT imaging of the cervical spine was performed without intravenous contrast. Multiplanar CT image reconstructions were also generated. RADIATION DOSE REDUCTION: This exam was performed according to the departmental dose-optimization program which includes automated exposure control, adjustment of the mA and/or kV according to patient size and/or use of iterative reconstruction technique. COMPARISON:  Radiographs 08/22/2022.  MRI 02/06/2023. FINDINGS: Alignment: Stable straightening. No focal angulation or significant listhesis. Skull base and vertebrae: No evidence of acute cervical spine fracture or traumatic subluxation. Status post C4-7 ACDF with an anterior plate, screws and intervertebral bone plugs. The hardware is intact. Interbody fusion appears solid. Soft tissues and spinal canal: No prevertebral fluid or swelling. No visible canal hematoma. Disc levels: The disc space findings are unchanged from the recent MRI and better demonstrated on that study. At C3-4, there is spur covering a right paracentral disc protrusion and associated mild cord flattening and mild left foraminal narrowing. There are residual small posterior osteophytes at C5-6 and C6-7.  At C7-T1, there is bilateral facet hypertrophy contributing to mild right-sided foraminal narrowing. Upper chest: Unremarkable. Other: Subcutaneous nodularity in the occipital scalp and upper neck posteriorly with low signal on recent MRI, likely sebaceous cysts. IMPRESSION: 1. No evidence of acute cervical spine fracture, traumatic subluxation or static signs of instability. 2. Solid interbody fusion status post C4-7 ACDF. 3. Right paracentral disc protrusion at C3-4 with mild cord flattening and mild left foraminal narrowing. Please see recent MRI report. 4. Mild right-sided foraminal narrowing at C7-T1. Electronically Signed   By: Elsie Perone M.D.   On: 02/17/2023 10:32       Assessment & Plan:  Health care maintenance Assessment & Plan: Physical today 02/02/24.  Had colonoscopy 2022.  PSA 06/01/23 - 2.1.    Hypercholesterolemia Assessment & Plan: Have previously discussed calculated cholesterol risk.  He has desired to hold on starting cholesterol medication.  Low cholesterol diet and exercise.  Follow lipid panel. Check lipid panel today.   Orders: -     Hepatic function panel -     Lipid panel -     TSH  Primary hypertension Assessment & Plan: She you can also currently on amlodipine .  Blood pressure doing well.  Check metabolic panel today.  Orders: -     CBC with Differential/Platelet -     Basic metabolic panel with GFR  Numbness and tingling of lower extremity Assessment & Plan: Has noticed a persistent pinprick sensation in his feet and lower extremities. Discussed further w/up. Check labs, including B12. Check NCS.   Orders: -     TSH -     Vitamin B12 -     Ambulatory referral to Neurology  Stage 3a chronic kidney disease (HCC) Assessment & Plan: Stay hydrated. Continue to avoid antiinflammatory medication. Check metabolic panel today.    Cervical disc herniation Assessment & Plan: Is s/p discectomy and fusion. Has been followed by [facility]. Plan NCS as outlined.     Arm numbness Assessment &  Plan: Has discussed with [facility]. May be related to his cervical issues and s/p surgery. No weakness. Will check NCS of upper extremities as well.   Orders: -     Ambulatory referral to Neurology     Allena Hamilton, MD

## 2024-02-02 NOTE — Assessment & Plan Note (Signed)
 Physical today 02/02/24.  Had colonoscopy 2022.  PSA 06/01/23 - 2.1.

## 2024-02-02 NOTE — Assessment & Plan Note (Signed)
 Is s/p discectomy and fusion. Has been followed by [facility]. Plan NCS as outlined.

## 2024-02-02 NOTE — Assessment & Plan Note (Signed)
 She you can also currently on amlodipine .  Blood pressure doing well.  Check metabolic panel today.

## 2024-02-03 ENCOUNTER — Ambulatory Visit: Payer: Self-pay | Admitting: Internal Medicine

## 2024-02-10 NOTE — Telephone Encounter (Signed)
 Pt following up on referral for neurology

## 2024-02-11 NOTE — Telephone Encounter (Signed)
 Noted

## 2024-03-21 ENCOUNTER — Encounter: Payer: Self-pay | Admitting: Internal Medicine

## 2024-03-23 NOTE — Telephone Encounter (Signed)
 Please request results of NCS from neurology. Please hold until we receive results. (Dr Maree [facility])

## 2024-03-25 NOTE — Telephone Encounter (Signed)
Records requested via efax

## 2024-04-25 NOTE — Telephone Encounter (Signed)
 MEDICAL RECORDS REQUEST RE-FAXED

## 2024-04-27 ENCOUNTER — Telehealth: Payer: Self-pay | Admitting: Internal Medicine

## 2024-04-27 NOTE — Telephone Encounter (Signed)
 Please call and notify Rodney Benjamin that I received his nerve conduction test results and his nerve conduction test appeared to be c/w neuropathy. Please confirm he is doing ok.

## 2024-04-28 NOTE — Telephone Encounter (Signed)
 Sent pt message via [employer]

## 2024-05-02 NOTE — Telephone Encounter (Signed)
 See if agreeable to schedule an appt to discuss.  If so, see if can come in 05/06/24  at 4:00. Thanks

## 2024-05-03 ENCOUNTER — Other Ambulatory Visit: Payer: Self-pay | Admitting: Internal Medicine

## 2024-05-06 ENCOUNTER — Encounter: Payer: Self-pay | Admitting: Internal Medicine

## 2024-05-06 ENCOUNTER — Ambulatory Visit (INDEPENDENT_AMBULATORY_CARE_PROVIDER_SITE_OTHER): Admitting: Internal Medicine

## 2024-05-06 VITALS — BP 122/70 | HR 80 | Temp 97.9°F | Ht 73.0 in | Wt 218.4 lb

## 2024-05-06 DIAGNOSIS — F439 Reaction to severe stress, unspecified: Secondary | ICD-10-CM | POA: Diagnosis not present

## 2024-05-06 DIAGNOSIS — E7800 Pure hypercholesterolemia, unspecified: Secondary | ICD-10-CM

## 2024-05-06 DIAGNOSIS — R200 Anesthesia of skin: Secondary | ICD-10-CM

## 2024-05-06 DIAGNOSIS — N1831 Chronic kidney disease, stage 3a: Secondary | ICD-10-CM

## 2024-05-06 DIAGNOSIS — I10 Essential (primary) hypertension: Secondary | ICD-10-CM

## 2024-05-06 DIAGNOSIS — R202 Paresthesia of skin: Secondary | ICD-10-CM

## 2024-05-06 NOTE — Progress Notes (Signed)
 Subjective:    Patient ID: Rodney Benjamin, male    DOB: 1961-05-29, 63 y.o.   MRN: [id number]  Patient here for  Chief Complaint  Patient presents with   Medical Management of Chronic Issues   Results    HPI Here as a work in appt - work in to discuss recent NCS. 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 No electrodiagnostic evidence of active LS radiculopathy. Cervical myelopathy unlikely to be sole cause of neuropathic symptoms. Discussed symptoms. Feels like sandpaper - intermittent. Also will notice occasional pin pricks. Toes  - up - pinprick. When sleeping - ok. He is staying active. [employer] - one hour per day. Walking 6 miles per day. Occasionally will notice symptoms in upper arms. Does not feel needs medication to control symptoms. Discussed alpha lipoic acid.    Past Medical History:  Diagnosis Date   History of chicken pox    Hypercholesterolemia    Hypertension    Sleep apnea Currently use CPAP Machine   Past Surgical History:  Procedure Laterality Date   INNER EAR SURGERY     cholesteatoma removed x 2   KNEE SURGERY  1982   right   TONSILLECTOMY     Family History  Problem Relation Age of Onset   Prostate cancer Other    Hypertension Other    Breast cancer Other    Social History   Socioeconomic History   Marital status: Married    Spouse name: Not on file   Number of children: Not on file   Years of education: Not on file   Highest education level: Bachelor's degree (e.g., BA, AB, BS)  Occupational History   Not on file  Tobacco Use   Smoking status: Never   Smokeless tobacco: Never  Substance and Sexual Activity   Alcohol use: No    Alcohol/week: 0.0 standard drinks of alcohol   Drug use: No   Sexual activity: Not on file  Other Topics Concern   Not on file  Social History Narrative   Not on file   Social Drivers of Health   Financial Resource Strain: Low Risk   (05/03/2024)   Overall Financial Resource Strain (CARDIA)    Difficulty of Paying Living Expenses: Not very hard  Food Insecurity: No Food Insecurity (05/03/2024)   Hunger Vital Sign    Worried About Running Out of Food in the Last Year: Never true    Ran Out of Food in the Last Year: Never true  Transportation Needs: No Transportation Needs (05/03/2024)   PRAPARE - Administrator, Civil Service (Medical): No    Lack of Transportation (Non-Medical): No  Physical Activity: Sufficiently Active (05/03/2024)   Exercise Vital Sign    Days of Exercise per Week: 4 days    Minutes of Exercise per Session: 60 min  Stress: No Stress Concern Present (05/03/2024)   [employer] of Occupational Health - Occupational Stress Questionnaire    Feeling of Stress: Not at all  Social Connections: Socially Integrated (05/03/2024)   Social Connection and Isolation Panel    Frequency of Communication with Friends and Family: More than three times a week    Frequency of Social Gatherings with Friends and Family: Once a week    Attends Religious Services: More than 4 times per year    Active Member of [employer] or Organizations: Yes    Attends Banker Meetings: More than 4 times per year  Marital Status: Married     Review of Systems  Constitutional:  Negative for appetite change and unexpected weight change.  HENT:  Negative for congestion and sinus pressure.   Respiratory:  Negative for cough, chest tightness and shortness of breath.   Cardiovascular:  Negative for chest pain, palpitations and leg swelling.  Gastrointestinal:  Negative for abdominal pain, diarrhea, nausea and vomiting.  Genitourinary:  Negative for difficulty urinating and dysuria.  Musculoskeletal:  Negative for joint swelling and myalgias.  Skin:  Negative for color change and rash.  Neurological:  Negative for dizziness and headaches.  Psychiatric/Behavioral:  Negative for agitation and dysphoric mood.         Objective:     BP 122/70   Pulse 80   Temp 97.9 F (36.6 C) (Oral)   Ht 6' 1 (1.854 m)   Wt 218 lb 6.4 oz (99.1 kg)   SpO2 97%   BMI 28.81 kg/m  Wt Readings from Last 3 Encounters:  05/06/24 218 lb 6.4 oz (99.1 kg)  02/02/24 222 lb (100.7 kg)  10/01/23 226 lb 3.2 oz (102.6 kg)    Physical Exam Vitals reviewed.  Constitutional:      General: He is not in acute distress.    Appearance: Normal appearance. He is well-developed.  HENT:     Head: Normocephalic and atraumatic.     Right Ear: External ear normal.     Left Ear: External ear normal.     Mouth/Throat:     Pharynx: No oropharyngeal exudate or posterior oropharyngeal erythema.  Eyes:     General: No scleral icterus.       Right eye: No discharge.        Left eye: No discharge.     Conjunctiva/sclera: Conjunctivae normal.  Cardiovascular:     Rate and Rhythm: Normal rate and regular rhythm.  Pulmonary:     Effort: Pulmonary effort is normal. No respiratory distress.     Breath sounds: Normal breath sounds.  Abdominal:     General: Bowel sounds are normal.     Palpations: Abdomen is soft.     Tenderness: There is no abdominal tenderness.  Musculoskeletal:        General: No swelling or tenderness.     Cervical back: Neck supple. No tenderness.     Comments: Motor strength appears to be equal bilaterally.   Lymphadenopathy:     Cervical: No cervical adenopathy.  Skin:    Findings: No erythema or rash.  Neurological:     Mental Status: He is alert.  Psychiatric:        Mood and Affect: Mood normal.        Behavior: Behavior normal.         Outpatient Encounter Medications as of 05/06/2024  Medication Sig   amLODipine  (NORVASC ) 5 MG tablet TAKE ONE TABLET ONCE DAILY   metroNIDAZOLE  (METROGEL ) 1 % gel Apply topically daily.   No facility-administered encounter medications on file as of 05/06/2024.     Lab Results  Component Value Date   WBC 4.6 02/02/2024   HGB 14.0 02/02/2024   HCT  41.9 02/02/2024   PLT 154.0 02/02/2024   GLUCOSE 90 02/02/2024   CHOL 127 02/02/2024   TRIG 60.0 02/02/2024   HDL 36.90 (L) 02/02/2024   LDLCALC 78 02/02/2024   ALT 25 02/02/2024   AST 19 02/02/2024   NA 139 02/02/2024   K 4.0 02/02/2024   CL 105 02/02/2024   CREATININE 1.23 02/02/2024  BUN 16 02/02/2024   CO2 29 02/02/2024   TSH 1.39 02/02/2024   PSA 2.10 06/01/2023    CT CERVICAL SPINE WO CONTRAST Result Date: 02/17/2023 CLINICAL DATA:  Severe right-sided neck pain radiating up into the head for 6 months. Left arm tingling. Previous cervical fusion. EXAM: CT CERVICAL SPINE WITHOUT CONTRAST TECHNIQUE: Multidetector CT imaging of the cervical spine was performed without intravenous contrast. Multiplanar CT image reconstructions were also generated. RADIATION DOSE REDUCTION: This exam was performed according to the departmental dose-optimization program which includes automated exposure control, adjustment of the mA and/or kV according to patient size and/or use of iterative reconstruction technique. COMPARISON:  Radiographs 08/22/2022.  MRI 02/06/2023. FINDINGS: Alignment: Stable straightening. No focal angulation or significant listhesis. Skull base and vertebrae: No evidence of acute cervical spine fracture or traumatic subluxation. Status post C4-7 ACDF with an anterior plate, screws and intervertebral bone plugs. The hardware is intact. Interbody fusion appears solid. Soft tissues and spinal canal: No prevertebral fluid or swelling. No visible canal hematoma. Disc levels: The disc space findings are unchanged from the recent MRI and better demonstrated on that study. At C3-4, there is spur covering a right paracentral disc protrusion and associated mild cord flattening and mild left foraminal narrowing. There are residual small posterior osteophytes at C5-6 and C6-7. At C7-T1, there is bilateral facet hypertrophy contributing to mild right-sided foraminal narrowing. Upper chest:  Unremarkable. Other: Subcutaneous nodularity in the occipital scalp and upper neck posteriorly with low signal on recent MRI, likely sebaceous cysts. IMPRESSION: 1. No evidence of acute cervical spine fracture, traumatic subluxation or static signs of instability. 2. Solid interbody fusion status post C4-7 ACDF. 3. Right paracentral disc protrusion at C3-4 with mild cord flattening and mild left foraminal narrowing. Please see recent MRI report. 4. Mild right-sided foraminal narrowing at C7-T1. Electronically Signed   By: Elsie Perone M.D.   On: 02/17/2023 10:32       Assessment & Plan:  Stress Assessment & Plan: Overall appears to be doing well. Follow.    Numbness and tingling of lower extremity Assessment & Plan: NCS as outlined. Symptoms as oultined. Discussed alpha lipoic acid daily. Discussed neurology f/u. Notify if desires.    Primary hypertension Assessment & Plan: Continues on amlodipine . Follow  pressures. Follow metabolic panel. No change today.    Hypercholesterolemia Assessment & Plan: Have previously discussed calculated cholesterol risk.  He has desired to hold on starting cholesterol medication.  Low cholesterol diet and exercise.  Follow lipid panel.    Stage 3a chronic kidney disease (HCC) Assessment & Plan: Stay hydrated. Continue to avoid antiinflammatory medication. Follow metabolic panel.       Allena Hamilton, MD

## 2024-05-06 NOTE — Patient Instructions (Signed)
 Alpha lipoic acid

## 2024-05-15 ENCOUNTER — Encounter: Payer: Self-pay | Admitting: Internal Medicine

## 2024-05-15 NOTE — Assessment & Plan Note (Signed)
 Continues on amlodipine . Follow  pressures. Follow metabolic panel. No change today.

## 2024-05-15 NOTE — Assessment & Plan Note (Signed)
 Stay hydrated. Continue to avoid antiinflammatory medication. Follow metabolic panel.

## 2024-05-15 NOTE — Assessment & Plan Note (Signed)
 Overall appears to be doing well.  Follow.

## 2024-05-15 NOTE — Assessment & Plan Note (Signed)
 NCS as outlined. Symptoms as oultined. Discussed alpha lipoic acid daily. Discussed neurology f/u. Notify if desires.

## 2024-05-15 NOTE — Assessment & Plan Note (Signed)
 Have previously discussed calculated cholesterol risk.  He has desired to hold on starting cholesterol medication.  Low cholesterol diet and exercise.  Follow lipid panel.

## 2024-06-06 ENCOUNTER — Encounter: Payer: Self-pay | Admitting: Internal Medicine

## 2024-06-06 ENCOUNTER — Ambulatory Visit: Admitting: Internal Medicine

## 2024-06-06 VITALS — BP 126/80 | HR 64 | Temp 98.0°F | Ht 73.0 in | Wt 217.0 lb

## 2024-06-06 DIAGNOSIS — R202 Paresthesia of skin: Secondary | ICD-10-CM | POA: Diagnosis not present

## 2024-06-06 DIAGNOSIS — I10 Essential (primary) hypertension: Secondary | ICD-10-CM | POA: Diagnosis not present

## 2024-06-06 DIAGNOSIS — Z1211 Encounter for screening for malignant neoplasm of colon: Secondary | ICD-10-CM

## 2024-06-06 DIAGNOSIS — E7800 Pure hypercholesterolemia, unspecified: Secondary | ICD-10-CM | POA: Diagnosis not present

## 2024-06-06 DIAGNOSIS — N1831 Chronic kidney disease, stage 3a: Secondary | ICD-10-CM | POA: Diagnosis not present

## 2024-06-06 DIAGNOSIS — R200 Anesthesia of skin: Secondary | ICD-10-CM

## 2024-06-06 DIAGNOSIS — M5020 Other cervical disc displacement, unspecified cervical region: Secondary | ICD-10-CM

## 2024-06-06 LAB — BASIC METABOLIC PANEL WITH GFR
BUN: 20 mg/dL (ref 6–23)
CO2: 29 meq/L (ref 19–32)
Calcium: 9.2 mg/dL (ref 8.4–10.5)
Chloride: 104 meq/L (ref 96–112)
Creatinine, Ser: 1.4 mg/dL (ref 0.40–1.50)
GFR: 53.3 mL/min — ABNORMAL LOW (ref >60.00)
Glucose, Bld: 93 mg/dL (ref 70–99)
Potassium: 4.3 meq/L (ref 3.5–5.1)
Sodium: 139 meq/L (ref 135–145)

## 2024-06-06 LAB — CBC WITH DIFFERENTIAL/PLATELET
Basophils Absolute: 0 K/uL (ref 0.0–0.1)
Basophils Relative: 0.4 % (ref 0.0–3.0)
Eosinophils Absolute: 0.1 K/uL (ref 0.0–0.7)
Eosinophils Relative: 1 % (ref 0.0–5.0)
HCT: 42.6 % (ref 39.0–52.0)
Hemoglobin: 14.6 g/dL (ref 13.0–17.0)
Lymphocytes Relative: 26.8 % (ref 12.0–46.0)
Lymphs Abs: 1.4 K/uL (ref 0.7–4.0)
MCHC: 34.3 g/dL (ref 30.0–36.0)
MCV: 91.5 fl (ref 78.0–100.0)
Monocytes Absolute: 0.4 K/uL (ref 0.1–1.0)
Monocytes Relative: 8.1 % (ref 3.0–12.0)
Neutro Abs: 3.4 K/uL (ref 1.4–7.7)
Neutrophils Relative %: 63.7 % (ref 43.0–77.0)
Platelets: 188 K/uL (ref 150.0–400.0)
RBC: 4.65 Mil/uL (ref 4.22–5.81)
RDW: 12.4 % (ref 11.5–15.5)
WBC: 5.3 K/uL (ref 4.0–10.5)

## 2024-06-06 LAB — LIPID PANEL
Cholesterol: 144 mg/dL (ref 0–200)
HDL: 32.9 mg/dL — ABNORMAL LOW (ref >39.00)
LDL Cholesterol: 95 mg/dL (ref 0–99)
NonHDL: 111.55
Total CHOL/HDL Ratio: 4
Triglycerides: 83 mg/dL (ref 0.0–149.0)
VLDL: 16.6 mg/dL (ref 0.0–40.0)

## 2024-06-06 LAB — HEPATIC FUNCTION PANEL
ALT: 17 U/L (ref 0–53)
AST: 16 U/L (ref 0–37)
Albumin: 4.3 g/dL (ref 3.5–5.2)
Alkaline Phosphatase: 53 U/L (ref 39–117)
Bilirubin, Direct: 0.2 mg/dL (ref 0.0–0.3)
Total Bilirubin: 0.8 mg/dL (ref 0.2–1.2)
Total Protein: 6.8 g/dL (ref 6.0–8.3)

## 2024-06-06 MED ORDER — AMLODIPINE BESYLATE 5 MG PO TABS: 5.0000 mg | ORAL_TABLET | Freq: Every day | ORAL | 3 refills | Status: AC

## 2024-06-06 NOTE — Progress Notes (Unsigned)
 Subjective:    Patient ID: Rodney Benjamin, male    DOB: 08/27/1960, 63 y.o.   MRN: [id number]  Patient here for  Chief Complaint  Patient presents with   Medical Management of Chronic Issues    HPI Here for a scheduled follow up - follow up regarding hypertension, CKD and hypercholesterolemia. Recently evaluated for numbness and tingling of lower extremities. Discussed alpha lipoic acid. Taking and feels this may have helped some. Still notices some increased sensitivity in his legs when something rubs his neck. Some aggravation after working out in his yard. Was questioning if neck could be contributing some to symptoms. Discussed NCS and peripheral neuropathy. Discussed f/u with neurology. No chest pain or sob reported. No cough or congestion now. Recent respiratory infection. No abdominal pain or bowel change reported.    Past Medical History:  Diagnosis Date   History of chicken pox    Hypercholesterolemia    Hypertension    Sleep apnea Currently use CPAP Machine   Past Surgical History:  Procedure Laterality Date   INNER EAR SURGERY     cholesteatoma removed x 2   KNEE SURGERY  1982   right   TONSILLECTOMY     Family History  Problem Relation Age of Onset   Prostate cancer Other    Hypertension Other    Breast cancer Other    Social History   Socioeconomic History   Marital status: Married    Spouse name: Not on file   Number of children: Not on file   Years of education: Not on file   Highest education level: Bachelor's degree (e.g., BA, AB, BS)  Occupational History   Not on file  Tobacco Use   Smoking status: Never   Smokeless tobacco: Never  Substance and Sexual Activity   Alcohol use: No    Alcohol/week: 0.0 standard drinks of alcohol   Drug use: No   Sexual activity: Not on file  Other Topics Concern   Not on file  Social History Narrative   Not on file   Social Drivers of Health   Tobacco Use: Low Risk (06/06/2024)   Patient History     Smoking Tobacco Use: Never    Smokeless Tobacco Use: Never    Passive Exposure: Not on file  Financial Resource Strain: Low Risk (05/03/2024)   Overall Financial Resource Strain (CARDIA)    Difficulty of Paying Living Expenses: Not very hard  Food Insecurity: No Food Insecurity (05/03/2024)   Epic    Worried About Programme Researcher, Broadcasting/film/video in the Last Year: Never true    Ran Out of Food in the Last Year: Never true  Transportation Needs: No Transportation Needs (05/03/2024)   Epic    Lack of Transportation (Medical): No    Lack of Transportation (Non-Medical): No  Physical Activity: Sufficiently Active (05/03/2024)   Exercise Vital Sign    Days of Exercise per Week: 4 days    Minutes of Exercise per Session: 60 min  Stress: No Stress Concern Present (05/03/2024)   [employer] of Occupational Health - Occupational Stress Questionnaire    Feeling of Stress: Not at all  Social Connections: Socially Integrated (05/03/2024)   Social Connection and Isolation Panel    Frequency of Communication with Friends and Family: More than three times a week    Frequency of Social Gatherings with Friends and Family: Once a week    Attends Religious Services: More than 4 times per year    Active Member  of Clubs or Organizations: Yes    Attends Banker Meetings: More than 4 times per year    Marital Status: Married  Depression (PHQ2-9): Low Risk (06/06/2024)   Depression (PHQ2-9)    PHQ-2 Score: 0  Alcohol Screen: Not on file  Housing: Unknown (05/03/2024)   Epic    Unable to Pay for Housing in the Last Year: No    Number of Times Moved in the Last Year: Not on file    Homeless in the Last Year: No  Utilities: Patient Declined (04/27/2023)   Received from [facility] Utilities    Threatened with loss of utilities: Patient declined  Health Literacy: Not on file     Review of Systems  Constitutional:  Negative for appetite change, fever and unexpected  weight change.  HENT:  Negative for congestion and sinus pressure.   Respiratory:  Negative for cough, chest tightness and shortness of breath.   Cardiovascular:  Negative for chest pain, palpitations and leg swelling.  Gastrointestinal:  Negative for abdominal pain, diarrhea, nausea and vomiting.  Genitourinary:  Negative for difficulty urinating and dysuria.  Musculoskeletal:  Negative for joint swelling and myalgias.  Skin:  Negative for color change and rash.  Neurological:  Negative for dizziness and headaches.  Psychiatric/Behavioral:  Negative for agitation and dysphoric mood.        Objective:     BP 126/80   Pulse 64   Temp 98 F (36.7 C) (Oral)   Ht 6' 1 (1.854 m)   Wt 217 lb (98.4 kg)   SpO2 97%   BMI 28.63 kg/m  Wt Readings from Last 3 Encounters:  06/06/24 217 lb (98.4 kg)  05/06/24 218 lb 6.4 oz (99.1 kg)  02/02/24 222 lb (100.7 kg)    Physical Exam Vitals reviewed.  Constitutional:      General: He is not in acute distress.    Appearance: Normal appearance. He is well-developed.  HENT:     Head: Normocephalic and atraumatic.     Right Ear: External ear normal.     Left Ear: External ear normal.     Mouth/Throat:     Pharynx: No oropharyngeal exudate or posterior oropharyngeal erythema.  Eyes:     General: No scleral icterus.       Right eye: No discharge.        Left eye: No discharge.     Conjunctiva/sclera: Conjunctivae normal.  Cardiovascular:     Rate and Rhythm: Normal rate and regular rhythm.  Pulmonary:     Effort: Pulmonary effort is normal. No respiratory distress.     Breath sounds: Normal breath sounds.  Abdominal:     General: Bowel sounds are normal.     Palpations: Abdomen is soft.     Tenderness: There is no abdominal tenderness.  Musculoskeletal:        General: No swelling or tenderness.     Cervical back: Neck supple. No tenderness.  Lymphadenopathy:     Cervical: No cervical adenopathy.  Skin:    Findings: No erythema or  rash.  Neurological:     Mental Status: He is alert.  Psychiatric:        Mood and Affect: Mood normal.        Behavior: Behavior normal.     {Perform Simple Foot Exam  Perform Detailed exam:1} {Insert foot Exam (Optional):30965}   Outpatient Encounter Medications as of 06/06/2024  Medication Sig   metroNIDAZOLE  (METROGEL ) 1 %  gel Apply topically daily.   [DISCONTINUED] amLODipine  (NORVASC ) 5 MG tablet TAKE ONE TABLET ONCE DAILY   amLODipine  (NORVASC ) 5 MG tablet Take 1 tablet (5 mg total) by mouth daily.   No facility-administered encounter medications on file as of 06/06/2024.     Lab Results  Component Value Date   WBC 4.6 02/02/2024   HGB 14.0 02/02/2024   HCT 41.9 02/02/2024   PLT 154.0 02/02/2024   GLUCOSE 90 02/02/2024   CHOL 127 02/02/2024   TRIG 60.0 02/02/2024   HDL 36.90 (L) 02/02/2024   LDLCALC 78 02/02/2024   ALT 25 02/02/2024   AST 19 02/02/2024   NA 139 02/02/2024   K 4.0 02/02/2024   CL 105 02/02/2024   CREATININE 1.23 02/02/2024   BUN 16 02/02/2024   CO2 29 02/02/2024   TSH 1.39 02/02/2024   PSA 2.10 06/01/2023    CT CERVICAL SPINE WO CONTRAST Result Date: 02/17/2023 CLINICAL DATA:  Severe right-sided neck pain radiating up into the head for 6 months. Left arm tingling. Previous cervical fusion. EXAM: CT CERVICAL SPINE WITHOUT CONTRAST TECHNIQUE: Multidetector CT imaging of the cervical spine was performed without intravenous contrast. Multiplanar CT image reconstructions were also generated. RADIATION DOSE REDUCTION: This exam was performed according to the departmental dose-optimization program which includes automated exposure control, adjustment of the mA and/or kV according to patient size and/or use of iterative reconstruction technique. COMPARISON:  Radiographs 08/22/2022.  MRI 02/06/2023. FINDINGS: Alignment: Stable straightening. No focal angulation or significant listhesis. Skull base and vertebrae: No evidence of acute cervical spine fracture  or traumatic subluxation. Status post C4-7 ACDF with an anterior plate, screws and intervertebral bone plugs. The hardware is intact. Interbody fusion appears solid. Soft tissues and spinal canal: No prevertebral fluid or swelling. No visible canal hematoma. Disc levels: The disc space findings are unchanged from the recent MRI and better demonstrated on that study. At C3-4, there is spur covering a right paracentral disc protrusion and associated mild cord flattening and mild left foraminal narrowing. There are residual small posterior osteophytes at C5-6 and C6-7. At C7-T1, there is bilateral facet hypertrophy contributing to mild right-sided foraminal narrowing. Upper chest: Unremarkable. Other: Subcutaneous nodularity in the occipital scalp and upper neck posteriorly with low signal on recent MRI, likely sebaceous cysts. IMPRESSION: 1. No evidence of acute cervical spine fracture, traumatic subluxation or static signs of instability. 2. Solid interbody fusion status post C4-7 ACDF. 3. Right paracentral disc protrusion at C3-4 with mild cord flattening and mild left foraminal narrowing. Please see recent MRI report. 4. Mild right-sided foraminal narrowing at C7-T1. Electronically Signed   By: Elsie Perone M.D.   On: 02/17/2023 10:32       Assessment & Plan:  Numbness and tingling of lower extremity  Hypercholesterolemia -     Lipid panel -     Hepatic function panel  Primary hypertension -     Basic metabolic panel with GFR -     CBC with Differential/Platelet  Other orders -     amLODIPine  Besylate; Take 1 tablet (5 mg total) by mouth daily.  Dispense: 90 tablet; Refill: 3     Allena Hamilton, MD

## 2024-06-08 ENCOUNTER — Ambulatory Visit: Payer: Self-pay | Admitting: Internal Medicine

## 2024-06-10 ENCOUNTER — Encounter: Payer: Self-pay | Admitting: Internal Medicine

## 2024-06-10 MED ORDER — METRONIDAZOLE 1 % EX GEL: Freq: Every day | CUTANEOUS | 0 refills | Status: AC

## 2024-06-10 NOTE — Telephone Encounter (Signed)
 Rx ok'd for metrogel .

## 2024-06-12 ENCOUNTER — Encounter: Payer: Self-pay | Admitting: Internal Medicine

## 2024-06-12 NOTE — Assessment & Plan Note (Signed)
 Stay hydrated. Continue to avoid antiinflammatory medication. Follow metabolic panel.

## 2024-06-12 NOTE — Assessment & Plan Note (Signed)
 Is s/p discectomy and fusion. Has been followed by [facility]. Request f/u with neurology as outlined.

## 2024-06-12 NOTE — Assessment & Plan Note (Signed)
 Colonoscopy 2022.

## 2024-06-12 NOTE — Assessment & Plan Note (Signed)
 Saw neurology. Had NCS as outlined - previous note. Taking alpha lipoic acid daily. Feels has helped some. Plan f/u with neurology. Had questions regarding his neck issues - contributing to some of his symptoms.

## 2024-06-12 NOTE — Assessment & Plan Note (Signed)
 Have previously discussed calculated cholesterol risk.  He has desired to hold on starting cholesterol medication.  Low cholesterol diet and exercise.  Follow lipid panel.  Lab Results  Component Value Date   CHOL 144 06/06/2024   HDL 32.90 (L) 06/06/2024   LDLCALC 95 06/06/2024   TRIG 83.0 06/06/2024   CHOLHDL 4 06/06/2024

## 2024-06-12 NOTE — Assessment & Plan Note (Signed)
 Continues on amlodipine . Follow  pressures. Follow metabolic panel. No change today.

## 2024-06-13 ENCOUNTER — Telehealth: Payer: Self-pay | Admitting: Internal Medicine

## 2024-06-13 NOTE — Telephone Encounter (Signed)
 Please call and notify Rodney Benjamin that I did reach out to Dr Jamal office to schedule a f/u appt (after his nerve conduction study). The appt scheduled was for 09/22/24 at 10:30 am.  They said he could call and check for earlier appt or cancellation. Let me know if any problems.  Please make sure he is aware of above appt time.  Thanks.

## 2024-06-14 NOTE — Telephone Encounter (Signed)
 Pt.notified

## 2024-10-03 ENCOUNTER — Other Ambulatory Visit

## 2024-10-05 ENCOUNTER — Ambulatory Visit: Admitting: Internal Medicine
